# Patient Record
Sex: Female | Born: 1940 | Race: White | Hispanic: No | Smoking: Never smoker
Health system: Southern US, Community
[De-identification: ages and names within clinical notes are randomized; demographics above are authoritative.]

## PROBLEM LIST (undated history)

## (undated) DIAGNOSIS — I639 Cerebral infarction, unspecified: Secondary | ICD-10-CM

## (undated) DIAGNOSIS — E119 Type 2 diabetes mellitus without complications: Secondary | ICD-10-CM

## (undated) DIAGNOSIS — I10 Essential (primary) hypertension: Secondary | ICD-10-CM

## (undated) HISTORY — PX: ABDOMINAL HYSTERECTOMY: SHX81

---

## 2005-04-25 ENCOUNTER — Emergency Department: Payer: Self-pay | Admitting: Emergency Medicine

## 2005-06-22 ENCOUNTER — Emergency Department: Payer: Self-pay | Admitting: Unknown Physician Specialty

## 2006-01-28 ENCOUNTER — Emergency Department: Payer: Self-pay | Admitting: General Practice

## 2007-05-30 ENCOUNTER — Other Ambulatory Visit: Payer: Self-pay

## 2007-05-31 ENCOUNTER — Observation Stay: Payer: Self-pay | Admitting: Internal Medicine

## 2007-08-07 ENCOUNTER — Other Ambulatory Visit: Payer: Self-pay

## 2007-08-07 ENCOUNTER — Emergency Department: Payer: Self-pay | Admitting: Emergency Medicine

## 2008-11-23 ENCOUNTER — Emergency Department: Payer: Self-pay | Admitting: Emergency Medicine

## 2009-09-05 ENCOUNTER — Emergency Department: Payer: Self-pay | Admitting: Emergency Medicine

## 2009-10-21 ENCOUNTER — Emergency Department: Payer: Self-pay | Admitting: Emergency Medicine

## 2010-01-19 ENCOUNTER — Emergency Department: Payer: Self-pay | Admitting: Unknown Physician Specialty

## 2010-09-09 ENCOUNTER — Emergency Department: Payer: Self-pay | Admitting: *Deleted

## 2010-11-03 ENCOUNTER — Emergency Department: Payer: Self-pay | Admitting: Emergency Medicine

## 2010-11-09 ENCOUNTER — Emergency Department: Payer: Self-pay | Admitting: Emergency Medicine

## 2011-03-17 ENCOUNTER — Emergency Department: Payer: Self-pay | Admitting: Emergency Medicine

## 2011-03-17 LAB — COMPREHENSIVE METABOLIC PANEL
Albumin: 3.6 g/dL (ref 3.4–5.0)
Alkaline Phosphatase: 54 U/L (ref 50–136)
Anion Gap: 9 (ref 7–16)
BUN: 22 mg/dL — ABNORMAL HIGH (ref 7–18)
Bilirubin,Total: 0.4 mg/dL (ref 0.2–1.0)
Calcium, Total: 8.9 mg/dL (ref 8.5–10.1)
Creatinine: 1.06 mg/dL (ref 0.60–1.30)
EGFR (African American): >60
EGFR (Non-African Amer.): 55 — ABNORMAL LOW
Glucose: 234 mg/dL — ABNORMAL HIGH (ref 65–99)
Osmolality: 290 (ref 275–301)
Potassium: 4 mmol/L (ref 3.5–5.1)
SGOT(AST): 13 U/L — ABNORMAL LOW (ref 15–37)
Sodium: 140 mmol/L (ref 136–145)
Total Protein: 7.3 g/dL (ref 6.4–8.2)

## 2011-03-17 LAB — CBC
HGB: 11.9 g/dL — ABNORMAL LOW (ref 12.0–16.0)
MCH: 27.2 pg (ref 26.0–34.0)
Platelet: 203 10*3/uL (ref 150–440)
RBC: 4.36 10*6/uL (ref 3.80–5.20)
RDW: 15.3 % — ABNORMAL HIGH (ref 11.5–14.5)
WBC: 5.9 10*3/uL (ref 3.6–11.0)

## 2011-03-17 LAB — CK TOTAL AND CKMB (NOT AT ARMC)
CK, Total: 19 U/L — ABNORMAL LOW (ref 21–215)
CK-MB: <0.5 ng/mL — ABNORMAL LOW (ref 0.5–3.6)

## 2011-03-17 LAB — TROPONIN I: Troponin-I: <0.02 ng/mL

## 2011-10-20 ENCOUNTER — Emergency Department: Payer: Self-pay | Admitting: Emergency Medicine

## 2011-10-20 LAB — CBC WITH DIFFERENTIAL/PLATELET
Basophil %: 1.3 %
Eosinophil #: 0.1 10*3/uL (ref 0.0–0.7)
Eosinophil %: 2.2 %
HGB: 13.2 g/dL (ref 12.0–16.0)
Lymphocyte %: 32.8 %
MCH: 28.2 pg (ref 26.0–34.0)
Monocyte #: 0.3 x10 3/mm (ref 0.2–0.9)
Neutrophil #: 3.9 10*3/uL (ref 1.4–6.5)
Neutrophil %: 59.1 %
Platelet: 190 10*3/uL (ref 150–440)
RBC: 4.68 10*6/uL (ref 3.80–5.20)
WBC: 6.7 10*3/uL (ref 3.6–11.0)

## 2011-10-20 LAB — BASIC METABOLIC PANEL
Anion Gap: 11 (ref 7–16)
Calcium, Total: 9.3 mg/dL (ref 8.5–10.1)
Co2: 26 mmol/L (ref 21–32)
EGFR (African American): 53 — ABNORMAL LOW
Glucose: 373 mg/dL — ABNORMAL HIGH (ref 65–99)
Osmolality: 295 (ref 275–301)

## 2011-10-21 LAB — URINALYSIS, COMPLETE
Ketone: NEGATIVE
Nitrite: POSITIVE
Ph: 5 (ref 4.5–8.0)
Protein: 30
Specific Gravity: 1.014 (ref 1.003–1.030)
WBC UR: 2 /HPF (ref 0–5)

## 2011-12-10 ENCOUNTER — Observation Stay: Payer: Self-pay | Admitting: Internal Medicine

## 2011-12-10 LAB — CK TOTAL AND CKMB (NOT AT ARMC)
CK, Total: 17 U/L — ABNORMAL LOW (ref 21–215)
CK-MB: <0.5 ng/mL — ABNORMAL LOW (ref 0.5–3.6)

## 2011-12-10 LAB — BASIC METABOLIC PANEL
Anion Gap: 9 (ref 7–16)
Calcium, Total: 9.2 mg/dL (ref 8.5–10.1)
Chloride: 105 mmol/L (ref 98–107)
Co2: 25 mmol/L (ref 21–32)
Creatinine: 1.08 mg/dL (ref 0.60–1.30)
EGFR (African American): >60
EGFR (Non-African Amer.): 52 — ABNORMAL LOW
Glucose: 152 mg/dL — ABNORMAL HIGH (ref 65–99)

## 2011-12-10 LAB — CBC WITH DIFFERENTIAL/PLATELET
Basophil %: 1.2 %
Basophil: 1 %
Comment - H1-Com2: NORMAL
Eosinophil %: 7.4 %
Eosinophil: 8 %
HCT: 35.7 % (ref 35.0–47.0)
HGB: 12.5 g/dL (ref 12.0–16.0)
Lymphocyte #: 2.1 10*3/uL (ref 1.0–3.6)
MCH: 28.3 pg (ref 26.0–34.0)
MCHC: 35 g/dL (ref 32.0–36.0)
MCV: 81 fL (ref 80–100)
Monocyte #: 0.3 x10 3/mm (ref 0.2–0.9)
Monocyte %: 4.8 %
Monocytes: 7 %
Neutrophil #: 3.7 10*3/uL (ref 1.4–6.5)
Neutrophil %: 55.5 %
RBC: 4.42 10*6/uL (ref 3.80–5.20)
WBC: 6.7 10*3/uL (ref 3.6–11.0)

## 2011-12-12 LAB — BASIC METABOLIC PANEL
Anion Gap: 10 (ref 7–16)
BUN: 36 mg/dL — ABNORMAL HIGH (ref 7–18)
Calcium, Total: 8.3 mg/dL — ABNORMAL LOW (ref 8.5–10.1)
Chloride: 108 mmol/L — ABNORMAL HIGH (ref 98–107)
Co2: 21 mmol/L (ref 21–32)
Creatinine: 1.17 mg/dL (ref 0.60–1.30)
Glucose: 168 mg/dL — ABNORMAL HIGH (ref 65–99)

## 2013-07-27 ENCOUNTER — Emergency Department: Payer: Self-pay | Admitting: Emergency Medicine

## 2013-07-27 LAB — CBC
HCT: 37.2 % (ref 35.0–47.0)
HGB: 13 g/dL (ref 12.0–16.0)
MCH: 28.9 pg (ref 26.0–34.0)
MCHC: 34.8 g/dL (ref 32.0–36.0)
MCV: 83 fL (ref 80–100)
PLATELETS: 177 10*3/uL (ref 150–440)
RBC: 4.49 10*6/uL (ref 3.80–5.20)
RDW: 15.2 % — ABNORMAL HIGH (ref 11.5–14.5)
WBC: 6.6 10*3/uL (ref 3.6–11.0)

## 2013-07-27 LAB — BASIC METABOLIC PANEL
Anion Gap: 7 (ref 7–16)
BUN: 27 mg/dL — ABNORMAL HIGH (ref 7–18)
CALCIUM: 8.5 mg/dL (ref 8.5–10.1)
CO2: 24 mmol/L (ref 21–32)
Chloride: 106 mmol/L (ref 98–107)
Creatinine: 1.23 mg/dL (ref 0.60–1.30)
GFR CALC AF AMER: 51 — AB
GFR CALC NON AF AMER: 44 — AB
Glucose: 183 mg/dL — ABNORMAL HIGH (ref 65–99)
OSMOLALITY: 284 (ref 275–301)
Potassium: 4.2 mmol/L (ref 3.5–5.1)
Sodium: 137 mmol/L (ref 136–145)

## 2013-07-27 LAB — TROPONIN I

## 2013-08-01 ENCOUNTER — Emergency Department: Payer: Self-pay | Admitting: Emergency Medicine

## 2014-03-18 ENCOUNTER — Emergency Department: Payer: Self-pay | Admitting: Emergency Medicine

## 2014-03-19 ENCOUNTER — Emergency Department: Payer: Self-pay | Admitting: Emergency Medicine

## 2014-03-29 ENCOUNTER — Observation Stay: Payer: Self-pay | Admitting: Internal Medicine

## 2014-05-13 NOTE — H&P (Signed)
PATIENT NAME:  Alba CoryHODGES, Callia MR#:  [id number] DATE OF BIRTH:  December 22, 1940  DATE OF ADMISSION:  12/10/2011  PRIMARY CARE PHYSICIAN: [facility]    CHIEF COMPLAINT: Headache.   HISTORY OF PRESENT ILLNESS: 74 year old female with history of cerebrovascular accident, hypertension as well as diabetes mellitus presents with headache of one weeks' duration. Patient notes that headache was gradual in onset and was waxing and waning in nature. At maximal intensity was about 5 to 6 dull ache that was in the frontal region. She notes that intermittently 200 mg ibuprofen would help but not consistently. Due to chronicity of the headache she presented to the Emergency Department. In the Emergency Department she was noted to be significantly hypertensive with systolic blood pressures on presentation being 176/101, however, this progressed to systolics 224/121. She received IV Vasotec with decrease of her blood pressure to 208/80 so we are being called to admit patient for accelerated hypertension.   Patient denies chest pain, dizziness, lightheadedness, tinnitus, vision changes, dyspnea, lower extremity edema, palpitations.   PAST MEDICAL HISTORY:  1. Hypertension.  2. History of cerebrovascular accident with mild right-sided weakness.  3. Diabetes mellitus.  4. History of hypothyroidism, patient stopped Synthroid on her own accord.  5. History of hyperlipidemia, patient stopped her statin on her own accord.   MEDICATIONS:  1. Enalapril 20 mg twice a day.  2. Hydrochlorothiazide 25 mg daily.  3. Metformin 1000 mg twice a day.  4. Metoprolol 100 mg twice a day.  5. Review of records in 2009 notes that patient was on Synthroid and Plavix at that times as well as Lipitor. Patient tells me that she discontinued these medications on her own because she felt that she was taking too many medications.   PAST SURGICAL HISTORY: Bilateral cataract surgery.   ALLERGIES: No  known drug allergies.   FAMILY HISTORY: Notable for cancer of unknown type, diabetes, hypertension, coronary artery disease and CVA.   SOCIAL HISTORY: Patient lives with her daughter and grandchildren. She denies tobacco, alcohol or illicit drug use. She is ambulatory with a walker.   REVIEW OF SYSTEMS: CONSTITUTIONAL: Denies fever, fatigue, weight loss. EYES: Notes that at baseline she has some blurry vision. No changes to her vision. Denies eye pain. Has history of cataracts. ENT: Denies tinnitus, ear pain, difficulty swallowing. RESPIRATORY: Notes she had a very mild cough. Otherwise denies wheezing, shortness of breath or painful respirations. CARDIOVASCULAR: Denies chest pain, orthopnea, edema, or palpitations. GASTROINTESTINAL: Denies nausea, vomiting, melena, or rectal bleeding. Admits to chronic diarrhea due to metformin. GENITOURINARY: Denies dysuria, frequency. HEME: Admits to easy bruising. INTEGUMENT: Denies rash. MUSCULOSKELETAL: Denies myalgias or arthralgias or joint swelling. NEURO: Admits to headache as well as history of cerebrovascular accident. PSYCH: Denies anxiety or depression.   PHYSICAL EXAMINATION:  VITAL SIGNS: Blood pressure 200/77, during my exam dropped to 198/90, was as high as 224/121, respirations 20, heart rate 61, sating 98% on room air.   GENERAL: Elderly Caucasian female in no apparent distress.   PSYCH: She is awake, alert, oriented x3. History is obtained from the patient. Judgment is intact. She is in no distress.   EYES: Pupils equally round, reactive to light and accommodation. Funduscopic exam was attempted and no papilledema noted.   ENT: Normal external ears and nares. Oropharynx is clear without erythema.   NEUROLOGICAL: Cranial nerves II through XII are intact. There is equal strength bilateral upper and lower extremities. Sensation and motor function is  intact. There is mild dysdiadokinesia on the right, this is very mild.   RESPIRATORY: Clear to  auscultation bilaterally. No wheezes, rales, or rhonchi. There is normal effort.   CARDIOVASCULAR: S1, S2, regular rate and rhythm. No murmurs appreciated. Pulses are equal bilaterally. There is no pretibial edema.   ABDOMEN: Soft, nontender, nondistended. There is no hepatosplenomegaly. There is normal bowel sounds.   SKIN: Warm and dry. There is a mild ecchymotic lesion on the left forearm. Otherwise, no erythematous lesions.   LABORATORY, DIAGNOSTIC AND RADIOLOGICAL DATA: Point of care Accu-Chek 143. BMP shows glucose 152. BUN 36, creatinine 1.08, sodium 139, potassium 4, chloride 105, bicarbonate 25, anion gap 9, osmolality 289 with a calcium of 9.2. CK 17, CK-MB is less than 0.5. Troponin is less than 0.02. White count 6.7, hemoglobin 12.5, hematocrit 35.7, platelets 183 with an MCV of 81.   EKG shows normal sinus rhythm at 80 beats per minute with premature atrial complexes.   ASSESSMENT AND PLAN: 74 year old female presenting with headache found to have hypertensive urgency.  1. Accelerated hypertension. At this time will resume her home medications, increase dose of hydrochlorothiazide to 50 mg daily and add Norvasc 10 mg daily as well. Will control blood pressure with hydralazine p.r.n. for systolic blood pressures greater than 190. At this time goal blood pressure is basically systolics in between 170s and 190s for the first 24 hours and then aggressive control can then subsequently be pursued. Patient is clinically stable at this time.  2. Diabetes mellitus. Will continue her metformin.  3. History of cerebrovascular accident. Will start aspirin 325 mg daily. Patient is probably at risk without her statin however, she does not want to be on that at this time.   4. Hypothyroidism. Patient is not on any Synthroid. Will defer to primary care provider regarding further management.  5. Hyperlipidemia. Again will defer statin given patient's preferences. Risks explained. She notes that she has  been on them for quite some time.  6. Prophylaxis. Lovenox.  7. CODE STATUS: FULL CODE. Surrogate decision maker is her daughter, Michon Kaczmarek [facility].).   DISPOSITION: Patient is being admitted to observation status for management of accelerated hypertension.   TIME SPENT: 45 minutes.   ____________________________ Aurther Loft, DO aeo:cms D: 12/10/2011 04:23:17 ET T: 12/10/2011 09:08:38 ET JOB#: [id number]  cc: Aurther Loft, DO, <Dictator> [facility]  Bonnie Roig E Lamin Chandley DO ELECTRONICALLY SIGNED 12/21/2011 3:19

## 2014-05-13 NOTE — Discharge Summary (Signed)
PATIENT NAME:  Gina Kelly, Gina Kelly MR#:  [id number] DATE OF BIRTH:  01/01/1941  DATE OF ADMISSION:  12/10/2011 DATE OF DISCHARGE:  12/13/2011   DISCHARGE DIAGNOSES:  1. 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  2. History of cerebrovascular accident. 3. Diabetes mellitus.   4. Hyperlipidemia.   PRIMARY DOCTOR: [facility]    CONSULTATION: Physical therapy consult  HISTORY OF PRESENT ILLNESS:  1. The patient is a 74 year old female with a history of CVA, diabetes, and hypertension who came in with severe headache. The patient tried ibuprofen with no help. When she came in her blood pressure was 176/101 and also 224/121. The patient received IV Vasotec in the ER and admitted for accelerated hypertension and severe headache. CT of the head did not show any acute changes. Her medications have been adjusted. She is on HCTZ 25 mg daily which was increased to 50 mg daily. The patient's lisinopril is increased to 20 mg p.o. b.i.d. and Norvasc to 10 mg daily. The patient was monitored on telemetry. The patient's headache improved because blood pressure improved. CT of the head no changes. Troponins have been negative. Her CBC has been fine with white count 6.7, hemoglobin 12.5, hematocrit 35.7, platelets 183. The patient's blood pressure nicely improved, however, the patient was on metoprolol 100 p.o. b.i.d. which we continue. The patient's heart rate dropped into 50's so metoprolol has been stopped since yesterday morning and this morning heart rate improved to 70's. The patient's metoprolol has been stopped since yesterday because of bradycardia with heart rate up to 50's only. The patient right now is monitored on tele and metoprolol is stopped since yesterday and heart rate improved to 73. She will not be on metoprolol when we discharge her. She will be going home with Norvasc, enalapril, HCTZ, and hydralazine has been added. The patient will be on hydralazine 25 mg  p.o. 4 times daily. The patient received one dose of hydralazine 25 mg one dose 10 minutes ago.  2. Diarrhea. The patient had diarrhea. According to the daughter, she has chronic diarrhea and metformin has been stopped previously for two weeks. At that time she did not have diarrhea. Whenever she takes metformin she does get diarrhea. We stopped the metformin when she had diarrhea two days ago and stool cultures were sent which were negative for Clostridium difficile. The patient did well with stopping the metformin. I told the daughter to talk to her primary doctor, Dr. Sherrie MustacheFisher, regarding starting other medications for diabetes instead of metformin because she is not tolerating the metformin with severe diarrhea.   DISPOSITION: The patient will be going home. She is going to stay with her daughters. She is going home today.   TIME SPENT ON DISCHARGE PREPARATION: More than 30 minutes.   ____________________________ Katha HammingSnehalatha Bergen Melle, MD sk:drc D: 12/13/2011 13:04:52 ET T: 12/13/2011 13:50:16 ET JOB#: [id number]  cc: Katha HammingSnehalatha Azrielle Springsteen, MD, <Dictator> [facility], Dr. Ulice BrilliantFisher  Nyxon Strupp MD ELECTRONICALLY SIGNED 12/20/2011 12:32

## 2014-05-25 NOTE — Discharge Summary (Signed)
PATIENT NAME:  Gina Kelly, Hagar MR#:  [id number] DATE OF BIRTH:  02/22/1940  DATE OF ADMISSION:  03/29/2014 DATE OF DISCHARGE:  03/31/2014  DISCHARGE DIAGNOSES: 1. Acute renal failure and dehydration.  2. Urinary tract infection.  3. Pancytopenia, needed to be rechecked as outpatient.  4. Hypertension.  5. Diabetes.   CONDITION ON DISCHARGE: Stable.   MEDICATIONS ON DISCHARGE:  1. Atorvastatin 20 mg once a day. 2. Hydralazine 25 mg 2 times a day.  3. Fluticasone nasal spray once a day.  4. Levothyroxine 75 mcg once a day.  5. Metoprolol 100 mg oral tablet once a day  6. Glipizide 5 mg oral tablet once a day.  7. Ciprofloxacin 500 mg every 12 hours for 2 days.  8. Plavix 75 mg once a day.  9. Ondansetron 4 mg oral tablet 3 times a day as needed for nausea and vomiting.  HOME HEALTH ON DISCHARGE: Yes.  HOME HEALTH SERVICES: Nurse.  Advised to check creatinine and CBC in one week for home health nurse and communicated with primary care physician doctor's office.  HISTORY OF PRESENTING ILLNESS: A 74 year old female who presented to the Emergency Room with complaints of generalized weakness and malaise. She was recently started on antibiotic for UTI. Initially had some back pain with UTI. She was started on ciprofloxacin. She did not feel like the antibiotic was working. She was seen again and started on Vicodin for the back pain and later on changed to Percocet which helped her back pain some but, over the course of taking antibiotic, she felt like back pain resolved. Decreased oral intake for almost a week now and intermittent nausea and generalized malaise and weakness and so came to Emergency Room. On arrival, she was noted to have acute on chronic kidney failure, and so hospitalist service was contacted for further management of the issue.  HOSPITAL COURSE AND STAY: 1. She was found to have a UTI a week ago and was taking ciprofloxacin. We also had renal culture from that collection, which  was growing Escherichia coli sensitive to ciprofloxacin, so we continued Cipro in hospital and gave a few more days on discharge. Gave some Zofran for symptomatic nausea relief. 2. Type 2 diabetes. We held her glipizide. She was on sliding scale insulin and diabetic diet and was stable.  3. Hypertension. Beta blocker and hydralazine. We held hydrochlorothiazide and ACE inhibitor because of renal failure.  4. Hypothyroidism. Continued home medication dose of Synthroid.  5. Pancytopenia. Likely it was the effect of infection. She was advised to follow with the primary care physician in 1 week to check her total.   Plan discussed with her daughter who was present in the hospital also.   IMPORTANT LABORATORY RESULTS IN THE HOSPITAL: WBC count 3.5, hemoglobin 11.3,  MCV 82. Glucose 165, BUN 42, creatinine 2.16, sodium 132, potassium 4.7, chloride 99, CO2 of 23.   Chest x-ray PA and lateral showed no active cardiopulmonary disease.   CT scan of the head without contrast: No acute intracranial pathology.  Ultrasound of the abdomen showed cholelithiasis. no evidence of obstruction or cholecystitis. Creatinine was 2.15 on March WBC count 2.9, hemoglobin 9.8, and her creatinine improved to 2.06. CD4 was tested which was negative. Ultrasound of bilateral kidneys were done, which showed possible non-obstructing 3-mm left renal calculus. No evidence of hydronephrosis. On March 7, creatinine was 1.73 and WBC count 2.8.  TOTAL TIME SPENT ON THIS DISCHARGE: 40 minutes.   ____________________________ Hope PigeonVaibhavkumar G. Elisabeth PigeonVachhani, MD vgv:jh D:  04/03/2014 21:08:50 ET T: 04/04/2014 04:43:25 ET JOB#: [id number]  cc: Hope Pigeon. Elisabeth Pigeon, MD, <Dictator> Altamese Dilling MD ELECTRONICALLY SIGNED 04/10/2014 11:47

## 2014-05-25 NOTE — H&P (Signed)
PATIENT NAME:  Gina Kelly, Gina Kelly MR#:  [id number] DATE OF BIRTH:  1941/01/01  DATE OF ADMISSION:  03/29/2014  CHIEF COMPLAINT: Weakness.   HISTORY OF PRESENT ILLNESS: This is a 74 year old female who presents to the ED for a complaint of generalized weakness and malaise. The patient recently was put on antibiotics for a  UTI. Initially, she had back pain with this UTI and was put on ciprofloxacin. She states that she did not feel like the antibiotics were working. She continued to have some back pain. The patient states that she never had dysuria with this UTI, just left flank and back pain. She was seen again and was put on Vicodin for her back pain and then changed to Percocet and felt that this helped her back pain some. Over the course of taking the antibiotics, she felt like her back pain then resolved or was resolving, but she just had decreased p.o. intake for the last week and a half with some intermittent nausea and generalized malaise and generalized weakness. She came to the ED tonight and was found on labs to have acute on chronic kidney failure, and at this time the hospitalists were called for admission for the kidney failure.   PRIMARY CARE PHYSICIAN: Zannie Cove, FNP  PAST MEDICAL HISTORY: Type 2 diabetes mellitus, hypertension, stroke, hypothyroidism, chronic back pain   CURRENT MEDICATIONS: Toprol XL 100 mg daily, metformin 500 mg daily, Synthroid 75 mcg daily, HCTZ/lisinopril combo 25 mg/20 mg daily, hydralazine 25 mg b.i.d., glipizide 5 mg daily, Lipitor 20 mg daily, Plavix 75 mg daily.   PAST SURGICAL HISTORY: Hysterectomy.   ALLERGIES: No known drug allergies.   FAMILY HISTORY: Hypertension, coronary artery disease, diabetes mellitus, cancer.   SOCIAL HISTORY: Nonsmoker, The patient denies alcohol use. Denies other illicit drug use.   REVIEW OF SYSTEMS:  CONSTITUTIONAL: The patient denies fever. Endorses fatigue and weakness.  EYES: The patient denies blurred or double  vision, pain or redness.  ENT: The patient denies ear pain, hearing loss or difficulty swallowing.  RESPIRATORY: The patient endorses a mild cough. Denies wheeze or dyspnea.  CARDIOVASCULAR: The patient denies chest pain, edema or palpitations.  GASTROINTESTINAL: The patient endorses some intermittent episodes of nausea and vomiting without diarrhea. Denies abdominal pain or constipation.  GENITOURINARY: The patient denies dysuria, hematuria or frequency.  ENDOCRINE: The patient denies nocturia or heat or cold intolerance. Endorses chronic thyroid problems due to her hypothyroidism.  HEMATOLOGIC/LYMPHATIC: The patient denies easy bruising or bleeding or swollen glands.  INTEGUMENTARY: The patient denies acne, rash or lesions.  MUSCULOSKELETAL: The patient denies arthritis, swelling or gout.  NEUROLOGIC: The patient denies numbness, weakness or headache.  PSYCHIATRIC: The patient denies anxiety, insomnia or depression.   PHYSICAL EXAMINATION:  VITAL SIGNS: Blood pressure 122/75, pulse 50, temperature 97.8, respirations 20, O2 saturation 96% on room air.  GENERAL: The patient is well nourished, supine in bed, in no apparent distress.  HEENT: Pupils equal, round and reactive to light and accommodation. Extraocular movements intact. No scleral icterus. Mucosal membranes are dry.  NECK: Thyroid is normal without any enlargement. Her neck is supple with no masses and is nontender. There is no cervical adenopathy. No JVD noted.  RESPIRATORY: Clear to auscultation bilaterally with no rales, rhonchi or wheezes. The patient is not in respiratory distress.  CARDIOVASCULAR: Regular rate and rhythm. No murmur, rubs or gallops auscultated. Good pedal pulses without lower extremity edema.  ABDOMEN: Soft, nontender, nondistended with good bowel sounds and no hepatosplenomegaly.  MUSCULOSKELETAL:  The patient has full spontaneous range of motion throughout all extremities with 5/5 muscular strength in all  extremities and no cyanosis or clubbing.  SKIN: No rash or lesions noted. Skin is warm, dry and intact.  LYMPHATIC: There is no adenopathy.  NEUROLOGIC: Cranial nerves intact. Sensation intact throughout. No dysarthria or aphasia.  PSYCHIATRIC: The patient is alert and oriented x3 and cooperative.   LABORATORY DATA: White blood count 3.5, hemoglobin 11.3, hematocrit 33.8, platelets 175,000. Sodium 136, potassium 4.1, chloride 102, bicarbonate 22, BUN 42, creatinine 2.15, glucose 191. Total protein 6.9, albumin 3.1, total bilirubin 0.4, alkaline phosphatase 57, AST 77, ALT 80. Urinalysis was negative.   RADIOLOGY: Chest x-ray showed no active cardiopulmonary disease.   CT of the head without contrast showed no acute intracranial pathology, mild to moderate cortical volume loss, chronic encephalomalacia in the right frontoparietal region from her old stroke.   Abdominal ultrasound shows fatty infiltration of the liver, cholelithiasis without evidence for obstruction or cholecystitis and a likely a 6-mm polyp within the gallbladder.   ASSESSMENT AND PLAN:  1.  Acute on chronic renal failure. We will hold the metformin given her acute rise in creatinine above the recommended level to take metformin. We will also hold her lisinopril/HCTZ combo blood pressure medication as these can also increase her renal injury. We will hydrate her with fluids and keep her here in observation at least overnight. Recheck her labs in the morning and see if her kidney failure has not improved.  2.  Urinary tract infection. The patient has been treated for a UTI and has not quite finished her course of ciprofloxacin for this. She has about 2 days left. We will finish his antibiotic here.  3.  Type 2 diabetes mellitus. We will hold her glipizide inpatient as well, and we will put her on sliding scale insulin and a diabetic diet.  4.  Hypertension. We will continue some of her home antihypertensives such as her beta blocker  and her hydralazine, and we will monitor her blood pressure closely. If her blood pressure tends to drift up, we can add IV p.r.n. medications to keep this controlled.  5.  Hypothyroidism. We will continue her home dose of Synthroid here.  6.  Deep vein thrombosis prophylaxis with subcutaneous heparin.   CODE STATUS: This patient is full code.   TIME SPENT ON THIS ADMISSION: 45 minutes.    ____________________________ Candace Cruiseavid F. Anne HahnWillis, MD dfw:JT D: 03/30/2014 00:41:58 ET T: 03/30/2014 07:14:59 ET JOB#: [id number]  cc: Candace Cruiseavid F. Anne HahnWillis, MD, <Dictator> Tanja Gift Scotty CourtF Tramain Gershman MD ELECTRONICALLY SIGNED 03/30/2014 20:30

## 2014-07-28 ENCOUNTER — Encounter: Payer: Self-pay | Admitting: Emergency Medicine

## 2014-07-28 ENCOUNTER — Emergency Department
Admission: EM | Admit: 2014-07-28 | Discharge: 2014-07-28 | Disposition: A | Discharge status: Home / Self Care | Payer: Medicare Other | Attending: Emergency Medicine | Admitting: Emergency Medicine

## 2014-07-28 ENCOUNTER — Emergency Department: Payer: Medicare Other

## 2014-07-28 DIAGNOSIS — R05 Cough: Secondary | ICD-10-CM | POA: Diagnosis present

## 2014-07-28 DIAGNOSIS — Z7902 Long term (current) use of antithrombotics/antiplatelets: Secondary | ICD-10-CM | POA: Diagnosis not present

## 2014-07-28 DIAGNOSIS — Z79899 Other long term (current) drug therapy: Secondary | ICD-10-CM | POA: Diagnosis not present

## 2014-07-28 DIAGNOSIS — E119 Type 2 diabetes mellitus without complications: Secondary | ICD-10-CM | POA: Insufficient documentation

## 2014-07-28 DIAGNOSIS — I10 Essential (primary) hypertension: Secondary | ICD-10-CM | POA: Diagnosis not present

## 2014-07-28 DIAGNOSIS — J9811 Atelectasis: Secondary | ICD-10-CM

## 2014-07-28 HISTORY — DX: Type 2 diabetes mellitus without complications: E11.9

## 2014-07-28 HISTORY — DX: Cerebral infarction, unspecified: I63.9

## 2014-07-28 HISTORY — DX: Essential (primary) hypertension: I10

## 2014-07-28 MED ORDER — IPRATROPIUM-ALBUTEROL 0.5-2.5 (3) MG/3ML IN SOLN
RESPIRATORY_TRACT | Status: AC
  Filled 2014-07-28: qty 3

## 2014-07-28 MED ORDER — IPRATROPIUM-ALBUTEROL 0.5-2.5 (3) MG/3ML IN SOLN
3.0000 mL | Freq: Once | RESPIRATORY_TRACT | Status: AC
  Administered 2014-07-28: 3 mL via RESPIRATORY_TRACT

## 2014-07-28 MED ORDER — BENZONATATE 100 MG PO CAPS: 100.0000 mg | ORAL_CAPSULE | Freq: Three times a day (TID) | ORAL | Status: DC | PRN

## 2014-07-28 MED ORDER — PENICILLIN V POTASSIUM 500 MG PO TABS: 500.0000 mg | ORAL_TABLET | Freq: Four times a day (QID) | ORAL | Status: DC

## 2014-07-28 MED ORDER — ALBUTEROL SULFATE HFA 108 (90 BASE) MCG/ACT IN AERS: 2.0000 | INHALATION_SPRAY | Freq: Four times a day (QID) | RESPIRATORY_TRACT | Status: DC | PRN

## 2014-07-28 MED ORDER — DICLOFENAC SODIUM 75 MG PO TBEC: 75.0000 mg | DELAYED_RELEASE_TABLET | Freq: Two times a day (BID) | ORAL | Status: DC

## 2014-07-28 NOTE — Discharge Instructions (Signed)
Atelectasis Atelectasis is a collapse of the small air sacs in the lungs (alveoli). When this occurs, all or part of a lung collapses and becomes airless. It can be caused by various things and is a common problem after surgery. The severity of atelectasis will vary depending on the size of the area involved and the underlying cause of the condition. CAUSES  There are multiple causes for atelectasis:   Shallow breathing, particularly if there is an injury to your chest wall or abdomen that makes it painful to take a deep breath. This commonly occurs after surgery.  Obstruction of your airways (bronchi or bronchioles). This may be caused by a buildup of mucus (mucus plug), tumors, blood clots (pulmonary embolus), or inhaled foreign bodies. Mucus plugs occur when the lungs do not expand enough to get rid of mucus.  Outside pressure on the lung. This may be caused by tumors, fluid (pleural effusion), or a leakage of air between the lung and rib cage (pneumothorax).   Infections such as pneumonia.  Scarring in lung tissue left over from previous infection or injury.  Some diseases such as cystic fibrosis. SIGNS AND SYMPTOMS  Often, atelectasis will have no symptoms. When symptoms occur, they include:  Shortness of breath.   Bluish color to your nails, lips, or mouth (cyanosis). DIAGNOSIS  Your health care provider may suspect atelectasis based on symptoms and physical findings. A chest X-ray may be done to confirm the diagnosis. More specialized X-ray exams are sometimes required.  TREATMENT  Treatment will depend on the cause of the atelectasis. Treatment may include:  Purposeful coughing to loosen mucus plugs in the lungs.  Chest physiotherapy. This consists of clapping or percussion on the chest over the lungs to further loosen mucus plugs.  Postural drainage techniques. This involves positioning your body so your head is lower than your chest. HOME CARE INSTRUCTIONS  Practice  relaxed deep breathing whenever you are sitting down. A good technique is to take a few relaxed deep breaths each time a commercial comes on if you are watching television.  If you were given a deep breathing device (such as an incentive spirometer) or a mucus clearance device, use this regularly as directed by your health care provider.  Try to cough several times a day as directed by your health care provider.  Perform any chest physiotherapy or postural drainage techniques as directed by your health care provider. If necessary, have someone (such as a family member) assist you with these techniques.  When lying down, lie on the unaffected side to encourage mucus drainage.  Stay physically active as much as possible. SEEK IMMEDIATE MEDICAL CARE IF:   You develop increasing problems with your breathing.   You develop severe chest pain.   You develop severe coughing, or you cough up blood.   You have a fever or persistent symptoms for more than 2-3 days.   You have a fever and your symptoms suddenly get worse.  MAKE SURE YOU:  Understand these instructions.  Will watch your condition.  Will get help right away if you are not doing well or get worse. Document Released: 01/10/2005 Document Revised: 01/15/2013 Document Reviewed: 07/18/2012 [facility] Patient Information 2015 [employer] [place] [place]. This information is not intended to replace advice given to you by your health care provider. Make sure you discuss any questions you have with your health care provider.   Take the prescription meds as directed. Take deep breaths and walk at home to exercise lungs. See your  provider for follow-up care.

## 2014-07-28 NOTE — ED Provider Notes (Signed)
[facility] Emergency Department Provider Note ____________________________________________  Time seen: 48  I have reviewed the triage vital signs and the nursing notes.  HISTORY  Chief Complaint  Cough  HPI Gina Kelly is a 74 y.o. female reports to the ED with complaints of cough for 1 week, which appeared mostly productive the last 2 days. She is here with her adult daughter who reports that the cough seems to be worse in the early a.m. She has been dosing Tussin without significant relief.  She denies fevers, chills, sweats, or wheeze.   Past Medical History  Diagnosis Date  . Diabetes mellitus without complication   . Hypertension   . Stroke     2000   There are no active problems to display for this patient.  Past Surgical History  Procedure Laterality Date  . Abdominal hysterectomy      Current Outpatient Rx  Name  Route  Sig  Dispense  Refill  . atorvastatin (LIPITOR) 20 MG tablet   Oral   Take 20 mg by mouth daily.         . clopidogrel (PLAVIX) 75 MG tablet   Oral   Take 75 mg by mouth daily.         Marland Kitchen glipiZIDE (GLUCOTROL) 5 MG tablet   Oral   Take by mouth daily before breakfast.         . hydrochlorothiazide (HYDRODIURIL) 25 MG tablet   Oral   Take 25 mg by mouth daily.         . metFORMIN (GLUCOPHAGE) 500 MG tablet   Oral   Take by mouth 2 (two) times daily with a meal.         . metoprolol succinate (TOPROL-XL) 25 MG 24 hr tablet   Oral   Take 25 mg by mouth daily.         Marland Kitchen albuterol (PROVENTIL HFA;VENTOLIN HFA) 108 (90 BASE) MCG/ACT inhaler   Inhalation   Inhale 2 puffs into the lungs every 6 (six) hours as needed for wheezing or shortness of breath.   1 Inhaler   0   . benzonatate (TESSALON PERLES) 100 MG capsule   Oral   Take 1 capsule (100 mg total) by mouth 3 (three) times daily as needed for cough (Take 1-2 per dose).   30 capsule   0    Allergies Review of patient's allergies indicates no  known allergies.  No family history on file.  Social History History  Substance Use Topics  . Smoking status: Never Smoker   . Smokeless tobacco: Not on file  . Alcohol Use: No   Review of Systems  Constitutional: Negative for fever. Eyes: Negative for visual changes. ENT: Negative for sore throat. Cardiovascular: Negative for chest pain. Respiratory: Negative for shortness of breath. Reports intermittently productive cough Gastrointestinal: Negative for abdominal pain, vomiting and diarrhea. Genitourinary: Negative for dysuria. Musculoskeletal: Negative for back pain. Skin: Negative for rash. Neurological: Negative for headaches, focal weakness or numbness. ____________________________________________  PHYSICAL EXAM:  VITAL SIGNS: ED Triage Vitals  Enc Vitals Group     BP 07/28/14 1552 182/66 mmHg     Pulse Rate 07/28/14 1552 62     Resp 07/28/14 1552 18     Temp 07/28/14 1552 98.8 F (37.1 C)     Temp Source 07/28/14 1552 Oral     SpO2 07/28/14 1552 98 %     Weight 07/28/14 1552 139 lb (63.05 kg)     Height 07/28/14  1552 5\' 2"  (1.575 m)     Head Cir --      Peak Flow --      Pain Score 07/28/14 1552 0     Pain Loc --      Pain Edu? --      Excl. in GC? --    Constitutional: Alert and oriented. Well appearing and in no distress. Eyes: Conjunctivae are normal. PERRL. Normal extraocular movements. ENT   Head: Normocephalic and atraumatic.   Nose: No congestion/rhinnorhea.   Mouth/Throat: Mucous membranes are moist.   Neck: Supple. No thyromegaly. Hematological/Lymphatic/Immunilogical: No cervical lymphadenopathy. Cardiovascular: Normal rate, regular rhythm.  Respiratory: Normal respiratory effort. No wheezes/rales/rhonchi. Gastrointestinal: Soft and nontender. No distention. Musculoskeletal: Nontender with normal range of motion in all extremities.  Neurologic:  Normal gait without ataxia. Normal speech and language. No gross focal neurologic  deficits are appreciated. Skin:  Skin is warm, dry and intact. No rash noted. Psychiatric: Mood and affect are normal. Patient exhibits appropriate insight and judgment. ____________________________________________   RADIOLOGY  CXR IMPRESSION: Minimal bibasilar atelectasis or scarring noted; lungs otherwise Clear.  I, Menshew, Charlesetta IvoryJenise V Bacon, personally viewed and evaluated these images as part of my medical decision making.  ____________________________________________  PROCEDURES  Duoneb x 1 - patient reports improvement ____________________________________________  INITIAL IMPRESSION / ASSESSMENT AND PLAN / ED COURSE  Radiology results to patient and family.  Treatment of atelectasis with increased activity at home. Family suggests she get out of her chair more.  Prescriptions for Tessalon Perles and albuterol MDI given.  Patient encouraged to exercise lungs by doing deep breathing exercises.  Follow-up with primary provider as needed.  ____________________________________________  FINAL CLINICAL IMPRESSION(S) / ED DIAGNOSES  Final diagnoses:  Atelectasis of both lungs  Cough   Lissa HoardJenise V Bacon Menshew, PA-C 07/29/14 0051  I have reviewed the mid-level's documentation and was available to the mid-level if needed during evaluation of this patient.    Darien Ramusavid W Danesha Kirchoff, MD 07/31/14 [phone]

## 2014-07-28 NOTE — ED Notes (Signed)
MD at bedside. 

## 2014-07-28 NOTE — ED Notes (Signed)
Cough for a week now, denies chills/fever.

## 2015-03-08 ENCOUNTER — Emergency Department
Admission: EM | Admit: 2015-03-08 | Discharge: 2015-03-08 | Disposition: A | Discharge status: Home / Self Care | Payer: Medicare Other | Attending: Emergency Medicine | Admitting: Emergency Medicine

## 2015-03-08 ENCOUNTER — Emergency Department: Payer: Medicare Other

## 2015-03-08 DIAGNOSIS — R05 Cough: Secondary | ICD-10-CM | POA: Diagnosis present

## 2015-03-08 DIAGNOSIS — Z79899 Other long term (current) drug therapy: Secondary | ICD-10-CM | POA: Diagnosis not present

## 2015-03-08 DIAGNOSIS — E119 Type 2 diabetes mellitus without complications: Secondary | ICD-10-CM | POA: Diagnosis not present

## 2015-03-08 DIAGNOSIS — I10 Essential (primary) hypertension: Secondary | ICD-10-CM | POA: Insufficient documentation

## 2015-03-08 DIAGNOSIS — J069 Acute upper respiratory infection, unspecified: Secondary | ICD-10-CM | POA: Insufficient documentation

## 2015-03-08 DIAGNOSIS — Z7902 Long term (current) use of antithrombotics/antiplatelets: Secondary | ICD-10-CM | POA: Insufficient documentation

## 2015-03-08 DIAGNOSIS — Z7984 Long term (current) use of oral hypoglycemic drugs: Secondary | ICD-10-CM | POA: Insufficient documentation

## 2015-03-08 LAB — URINALYSIS COMPLETE WITH MICROSCOPIC (ARMC ONLY)
BACTERIA UA: NONE SEEN
BILIRUBIN URINE: NEGATIVE
Glucose, UA: >500 mg/dL — AB
Hgb urine dipstick: NEGATIVE
Ketones, ur: NEGATIVE mg/dL
Nitrite: NEGATIVE
PH: 5 (ref 5.0–8.0)
Protein, ur: 30 mg/dL — AB
Specific Gravity, Urine: 1.014 (ref 1.005–1.030)

## 2015-03-08 LAB — CBC
HEMATOCRIT: 35.9 % (ref 35.0–47.0)
HEMOGLOBIN: 12.4 g/dL (ref 12.0–16.0)
MCH: 28 pg (ref 26.0–34.0)
MCHC: 34.6 g/dL (ref 32.0–36.0)
MCV: 81 fL (ref 80.0–100.0)
Platelets: 170 10*3/uL (ref 150–440)
RBC: 4.44 MIL/uL (ref 3.80–5.20)
RDW: 14.5 % (ref 11.5–14.5)
WBC: 8.8 10*3/uL (ref 3.6–11.0)

## 2015-03-08 LAB — GLUCOSE, CAPILLARY: Glucose-Capillary: 405 mg/dL — ABNORMAL HIGH (ref 65–99)

## 2015-03-08 LAB — COMPREHENSIVE METABOLIC PANEL
ALBUMIN: 3.7 g/dL (ref 3.5–5.0)
ALT: 25 U/L (ref 14–54)
AST: 18 U/L (ref 15–41)
Alkaline Phosphatase: 72 U/L (ref 38–126)
Anion gap: 10 (ref 5–15)
BUN: 39 mg/dL — AB (ref 6–20)
CHLORIDE: 99 mmol/L — AB (ref 101–111)
CO2: 23 mmol/L (ref 22–32)
Calcium: 8.8 mg/dL — ABNORMAL LOW (ref 8.9–10.3)
Creatinine, Ser: 1.43 mg/dL — ABNORMAL HIGH (ref 0.44–1.00)
GFR calc Af Amer: 41 mL/min — ABNORMAL LOW (ref >60)
GFR calc non Af Amer: 35 mL/min — ABNORMAL LOW (ref >60)
Glucose, Bld: 448 mg/dL — ABNORMAL HIGH (ref 65–99)
POTASSIUM: 4.5 mmol/L (ref 3.5–5.1)
Sodium: 132 mmol/L — ABNORMAL LOW (ref 135–145)
Total Bilirubin: 0.9 mg/dL (ref 0.3–1.2)
Total Protein: 7.4 g/dL (ref 6.5–8.1)

## 2015-03-08 MED ORDER — OSELTAMIVIR PHOSPHATE 75 MG PO CAPS: 75.0000 mg | ORAL_CAPSULE | Freq: Two times a day (BID) | ORAL | Status: DC

## 2015-03-08 NOTE — Discharge Instructions (Signed)
Please take your medication as prescribed. Please drink plenty of water, recheck your blood glucose tonight or tomorrow. Please follow-up with your primary care doctor in 1-2 days for recheck/reevaluation. Return to the emergency department for any worsening symptoms, or any other personally concerning symptoms.   Upper Respiratory Infection, Adult Most upper respiratory infections (URIs) are a viral infection of the air passages leading to the lungs. A URI affects the nose, throat, and upper air passages. The most common type of URI is nasopharyngitis and is typically referred to as "the common cold." URIs run their course and usually go away on their own. Most of the time, a URI does not require medical attention, but sometimes a bacterial infection in the upper airways can follow a viral infection. This is called a secondary infection. Sinus and middle ear infections are common types of secondary upper respiratory infections. Bacterial pneumonia can also complicate a URI. A URI can worsen asthma and chronic obstructive pulmonary disease (COPD). Sometimes, these complications can require emergency medical care and may be life threatening.  CAUSES Almost all URIs are caused by viruses. A virus is a type of germ and can spread from one person to another.  RISKS FACTORS You may be at risk for a URI if:   You smoke.   You have chronic heart or lung disease.  You have a weakened defense (immune) system.   You are very young or very old.   You have nasal allergies or asthma.  You work in crowded or poorly ventilated areas.  You work in health care facilities or schools. SIGNS AND SYMPTOMS  Symptoms typically develop 2-3 days after you come in contact with a cold virus. Most viral URIs last 7-10 days. However, viral URIs from the influenza virus (flu virus) can last 14-18 days and are typically more severe. Symptoms may include:   Runny or stuffy (congested) nose.   Sneezing.   Cough.    Sore throat.   Headache.   Fatigue.   Fever.   Loss of appetite.   Pain in your forehead, behind your eyes, and over your cheekbones (sinus pain).  Muscle aches.  DIAGNOSIS  Your health care provider may diagnose a URI by:  Physical exam.  Tests to check that your symptoms are not due to another condition such as:  Strep throat.  Sinusitis.  Pneumonia.  Asthma. TREATMENT  A URI goes away on its own with time. It cannot be cured with medicines, but medicines may be prescribed or recommended to relieve symptoms. Medicines may help:  Reduce your fever.  Reduce your cough.  Relieve nasal congestion. HOME CARE INSTRUCTIONS   Take medicines only as directed by your health care provider.   Gargle warm saltwater or take cough drops to comfort your throat as directed by your health care provider.  Use a warm mist humidifier or inhale steam from a shower to increase air moisture. This may make it easier to breathe.  Drink enough fluid to keep your urine clear or pale yellow.   Eat soups and other clear broths and maintain good nutrition.   Rest as needed.   Return to work when your temperature has returned to normal or as your health care provider advises. You may need to stay home longer to avoid infecting others. You can also use a face mask and careful hand washing to prevent spread of the virus.  Increase the usage of your inhaler if you have asthma.   Do not use any  tobacco products, including cigarettes, chewing tobacco, or electronic cigarettes. If you need help quitting, ask your health care provider. PREVENTION  The best way to protect yourself from getting a cold is to practice good hygiene.   Avoid oral or hand contact with people with cold symptoms.   Wash your hands often if contact occurs.  There is no clear evidence that vitamin C, vitamin E, echinacea, or exercise reduces the chance of developing a cold. However, it is always  recommended to get plenty of rest, exercise, and practice good nutrition.  SEEK MEDICAL CARE IF:   You are getting worse rather than better.   Your symptoms are not controlled by medicine.   You have chills.  You have worsening shortness of breath.  You have brown or red mucus.  You have yellow or brown nasal discharge.  You have pain in your face, especially when you bend forward.  You have a fever.  You have swollen neck glands.  You have pain while swallowing.  You have white areas in the back of your throat. SEEK IMMEDIATE MEDICAL CARE IF:   You have severe or persistent:  Headache.  Ear pain.  Sinus pain.  Chest pain.  You have chronic lung disease and any of the following:  Wheezing.  Prolonged cough.  Coughing up blood.  A change in your usual mucus.  You have a stiff neck.  You have changes in your:  Vision.  Hearing.  Thinking.  Mood. MAKE SURE YOU:   Understand these instructions.  Will watch your condition.  Will get help right away if you are not doing well or get worse.   This information is not intended to replace advice given to you by your health care provider. Make sure you discuss any questions you have with your health care provider.   Document Released: 07/06/2000 Document Revised: 05/27/2014 Document Reviewed: 04/17/2013 Elsevier Interactive Patient Education [employer]

## 2015-03-08 NOTE — ED Notes (Signed)
Pt c/o lower back pain, cough, congestion X "a few days"Pt alert and oriented X4, active, cooperative, pt in NAD. RR even and unlabored, color WNL.

## 2015-03-08 NOTE — ED Provider Notes (Signed)
[facility] Emergency Department Provider Note  Time seen: 3:25 PM  I have reviewed the triage vital signs and the nursing notes.   HISTORY  Chief Complaint URI    HPI Lineth Thielke is a 75 y.o. female with a past medical history of diabetes, hypertension, CVA, who presents the emergency department with cough and congestion for the past 3 days. According to the patient for the past 2-3 days she has been having a cough, with sore throat.Patient states occasional sputum production, denies fever, nausea, vomiting, abdominal pain. Denies dysuria. Describes the cough as moderate.     Past Medical History  Diagnosis Date  . Diabetes mellitus without complication   . Hypertension   . Stroke     2000    There are no active problems to display for this patient.   Past Surgical History  Procedure Laterality Date  . Abdominal hysterectomy      Current Outpatient Rx  Name  Route  Sig  Dispense  Refill  . albuterol (PROVENTIL HFA;VENTOLIN HFA) 108 (90 BASE) MCG/ACT inhaler   Inhalation   Inhale 2 puffs into the lungs every 6 (six) hours as needed for wheezing or shortness of breath.   1 Inhaler   0   . atorvastatin (LIPITOR) 20 MG tablet   Oral   Take 20 mg by mouth daily.         . benzonatate (TESSALON PERLES) 100 MG capsule   Oral   Take 1 capsule (100 mg total) by mouth 3 (three) times daily as needed for cough (Take 1-2 per dose).   30 capsule   0   . clopidogrel (PLAVIX) 75 MG tablet   Oral   Take 75 mg by mouth daily.         Marland Kitchen glipiZIDE (GLUCOTROL) 5 MG tablet   Oral   Take by mouth daily before breakfast.         . hydrochlorothiazide (HYDRODIURIL) 25 MG tablet   Oral   Take 25 mg by mouth daily.         . metFORMIN (GLUCOPHAGE) 500 MG tablet   Oral   Take by mouth 2 (two) times daily with a meal.         . metoprolol succinate (TOPROL-XL) 25 MG 24 hr tablet   Oral   Take 25 mg by mouth daily.            Allergies Review of patient's allergies indicates no known allergies.  No family history on file.  Social History Social History  Substance Use Topics  . Smoking status: Never Smoker   . Smokeless tobacco: Not on file  . Alcohol Use: No    Review of Systems Constitutional: Negative for fever. Cardiovascular: Negative for chest pain. Respiratory: Negative for shortness of breath. Positive for cough. Gastrointestinal: Negative for abdominal pain Genitourinary: Negative for dysuria. Skin: Negative for rash. Neurological: Mild headache. 10-point ROS otherwise negative.  ____________________________________________   PHYSICAL EXAM:  VITAL SIGNS: ED Triage Vitals  Enc Vitals Group     BP 03/08/15 1237 164/75 mmHg     Pulse Rate 03/08/15 1237 88     Resp 03/08/15 1237 18     Temp 03/08/15 1237 99.2 F (37.3 C)     Temp Source 03/08/15 1237 Oral     SpO2 03/08/15 1237 96 %     Weight 03/08/15 1237 139 lb (63.05 kg)     Height 03/08/15 1237  (1.575 m)  Head Cir --      Peak Flow --      Pain Score 03/08/15 1239 8     Pain Loc --      Pain Edu? --      Excl. in GC? --     Constitutional: Alert and oriented. Well appearing and in no distress. Eyes: Normal exam ENT   Head: Normocephalic and atraumatic.   Mouth/Throat: Mucous membranes are moist. Cardiovascular: Normal rate, regular rhythm. No murmur Respiratory: Normal respiratory effort without tachypnea nor retractions. Breath sounds are clear. Occasional cough during exam. Gastrointestinal: Soft and nontender. No distention.   Musculoskeletal: Nontender with normal range of motion in all extremities. Neurologic:  Normal speech and language. No gross focal neurologic deficits Skin:  Skin is warm, dry and intact.  Psychiatric: Mood and affect are normal. Speech and behavior are normal. ____________________________________________    EKG  EKG reviewed and interpreted by myself shows normal  sinus rhythm at 79 bpm, narrow QRS, left axis deviation, normal intervals, nonspecific ST changes. No ST elevations.  ____________________________________________    RADIOLOGY  Chest x-ray shows no acute disease.  INITIAL IMPRESSION / ASSESSMENT AND PLAN / ED COURSE  Pertinent labs & imaging results that were available during my care of the patient were reviewed by me and considered in my medical decision making (see chart for details).  Patient presents to the emergency department 3 days of cough, congestion, mild headache. Patient's grandson was diagnosed with influenza 3 days ago. Patient has been exposed to her grandson over the past week. Patient's labs show an elevated blood glucose. Patient is aware of this, she has a home health nurse who has been checking her blood glucose, it was 200s earlier today. I discussed with the patient and her elevated blood glucose obtaining IV to dose fluids and possibly insulin if her blood sugar does not respond, the patient is adamantly against an IV, states she will increase her water consumption at home. Patient denies any dysuria however she does have mildly cloudy urine, we will send the urine for urinalysis. Given the patient's recent influenza exposure, cough, congestion, headache with low-grade fever in the emergency department we will cover with Tamiflu for possible influenza. Patient's labs are otherwise quite reassuring with mild renal insufficiency, and a normal white blood cell count.  Urinalysis negative. We'll discharge patient with Tamiflu and PCP follow-up in 1-2 days. Patient agreeable. Agrees to hydrate orally at home with plenty of water, I discussed with the patient's family the need to recheck her blood glucose tonight or tomorrow. Patient and family are agreeable. The home health nurse will be out to see the patient tomorrow.  ____________________________________________   FINAL CLINICAL IMPRESSION(S) / ED DIAGNOSES  Viral upper  respiratory infection   Minna Antis, MD 03/08/15 1624

## 2015-03-08 NOTE — ED Notes (Addendum)
Pt reports cold symptoms for last 2-3 days. Pt reports increased coughing at night. Denies chest pain or fever reports headache. Pt has diabetes accu check in triage 405. Pt takes metformin.

## 2015-03-08 NOTE — ED Notes (Signed)
Pt daughter at to nurses station states that they are "just going to leave" due to waiting for discharge paperwork. Pt family and pt informed that pt is marked for discharge and that the papers will be up shortly. Pt daughter appears frustrated at the amount of time that they have been in ER and states that "the pharmacy will be closed and she will have to go home with no medication". \

## 2015-03-08 NOTE — ED Notes (Signed)
Pt alert and oriented X4, active, cooperative, pt in NAD. RR even and unlabored, color WNL.  Pt informed to return if any life threatening symptoms occur.   

## 2016-05-18 ENCOUNTER — Emergency Department
Admission: EM | Admit: 2016-05-18 | Discharge: 2016-05-19 | Disposition: A | Discharge status: Home / Self Care | Payer: Medicare Other | Attending: Emergency Medicine | Admitting: Emergency Medicine

## 2016-05-18 ENCOUNTER — Emergency Department: Payer: Medicare Other

## 2016-05-18 ENCOUNTER — Encounter: Payer: Self-pay | Admitting: Emergency Medicine

## 2016-05-18 DIAGNOSIS — I10 Essential (primary) hypertension: Secondary | ICD-10-CM | POA: Insufficient documentation

## 2016-05-18 DIAGNOSIS — E119 Type 2 diabetes mellitus without complications: Secondary | ICD-10-CM | POA: Insufficient documentation

## 2016-05-18 DIAGNOSIS — N3001 Acute cystitis with hematuria: Secondary | ICD-10-CM | POA: Diagnosis not present

## 2016-05-18 DIAGNOSIS — Z79899 Other long term (current) drug therapy: Secondary | ICD-10-CM | POA: Diagnosis not present

## 2016-05-18 DIAGNOSIS — R42 Dizziness and giddiness: Secondary | ICD-10-CM

## 2016-05-18 DIAGNOSIS — Z7984 Long term (current) use of oral hypoglycemic drugs: Secondary | ICD-10-CM | POA: Diagnosis not present

## 2016-05-18 LAB — BASIC METABOLIC PANEL
Anion gap: 9 (ref 5–15)
BUN: 45 mg/dL — ABNORMAL HIGH (ref 6–20)
CALCIUM: 9.1 mg/dL (ref 8.9–10.3)
CO2: 25 mmol/L (ref 22–32)
CREATININE: 1.32 mg/dL — AB (ref 0.44–1.00)
Chloride: 103 mmol/L (ref 101–111)
GFR calc Af Amer: 45 mL/min — ABNORMAL LOW (ref >60)
GFR, EST NON AFRICAN AMERICAN: 39 mL/min — AB (ref >60)
GLUCOSE: 221 mg/dL — AB (ref 65–99)
Potassium: 4.1 mmol/L (ref 3.5–5.1)
Sodium: 137 mmol/L (ref 135–145)

## 2016-05-18 LAB — CBC WITH DIFFERENTIAL/PLATELET
BASOS ABS: 0.1 10*3/uL (ref 0–0.1)
BASOS PCT: 1 %
EOS PCT: 4 %
Eosinophils Absolute: 0.3 10*3/uL (ref 0–0.7)
HEMATOCRIT: 37.8 % (ref 35.0–47.0)
Hemoglobin: 12.9 g/dL (ref 12.0–16.0)
LYMPHS PCT: 35 %
Lymphs Abs: 2.8 10*3/uL (ref 1.0–3.6)
MCH: 28.7 pg (ref 26.0–34.0)
MCHC: 34.2 g/dL (ref 32.0–36.0)
MCV: 83.8 fL (ref 80.0–100.0)
MONO ABS: 0.5 10*3/uL (ref 0.2–0.9)
MONOS PCT: 6 %
NEUTROS ABS: 4.3 10*3/uL (ref 1.4–6.5)
Neutrophils Relative %: 54 %
PLATELETS: 169 10*3/uL (ref 150–440)
RBC: 4.51 MIL/uL (ref 3.80–5.20)
RDW: 14.5 % (ref 11.5–14.5)
WBC: 8.1 10*3/uL (ref 3.6–11.0)

## 2016-05-18 LAB — URINALYSIS, COMPLETE (UACMP) WITH MICROSCOPIC
Bilirubin Urine: NEGATIVE
Glucose, UA: NEGATIVE mg/dL
Hgb urine dipstick: NEGATIVE
Ketones, ur: NEGATIVE mg/dL
Nitrite: NEGATIVE
PROTEIN: NEGATIVE mg/dL
SPECIFIC GRAVITY, URINE: 1.006 (ref 1.005–1.030)
pH: 5 (ref 5.0–8.0)

## 2016-05-18 LAB — TROPONIN I: Troponin I: <0.03 ng/mL (ref <0.03)

## 2016-05-18 NOTE — ED Notes (Signed)
Pt in CT at this time.

## 2016-05-18 NOTE — ED Notes (Signed)
MD Don Perking at bedside at this time.

## 2016-05-18 NOTE — ED Notes (Signed)
Gave shift change report to [employer].N.

## 2016-05-18 NOTE — ED Notes (Signed)
Pt. Daughter states that mother was dizzy today and had diarrhea last night. Pt states that she is a diabetic. Pt has also been having SOB.

## 2016-05-18 NOTE — ED Triage Notes (Signed)
Pt presents to ED with elevated blood pressure and dizziness since last night. Denies any other symptoms at this time.

## 2016-05-18 NOTE — ED Provider Notes (Signed)
[facility] Emergency Department Provider Note  ____________________________________________  Time seen: Approximately 11:23 PM  I have reviewed the triage vital signs and the nursing notes.   HISTORY  Chief Complaint Dizziness and Hypertension  Level 5 caveat:  Portions of the history and physical were unable to be obtained due to patient being poor historian   HPI Gina Kelly is a 76 y.o. female with history of diabetes, hypertension, CVA who presents for evaluation of dizziness. Patient reports on and off episodes of vertigo started yesterday evening. According to her daughter she has been most of the day in bed, has felt very weak. Yesterday evening she had 4 episodes of watery diarrhea however that has resolved today. No dysarthria, dysphasia, diplopia, facial droop, difficulty walking, unilateral weakness or numbness, difficulty finding words are slurred speech. No changes in gait and patient uses walker at home. She denies that she feels lightheaded like she is going to faint. Patient was seen by her primary care doctor 2 days ago for similar episode. Patient is unable to tell me if these episodes are brought on by movement and what makes them go away. Patient is a very poor historian. She denies prior history of vertigo. She is on Plavix. She is also complaining of a mild headache that has been present since this evening that is generalized and throbbing.  Past Medical History:  Diagnosis Date  . Diabetes mellitus without complication (HCC)   . Hypertension   . Stroke [facility])    2000    There are no active problems to display for this patient.   Past Surgical History:  Procedure Laterality Date  . ABDOMINAL HYSTERECTOMY      Prior to Admission medications   Medication Sig Start Date End Date Taking? Authorizing Provider  albuterol (PROVENTIL HFA;VENTOLIN HFA) 108 (90 BASE) MCG/ACT inhaler Inhale 2 puffs into the lungs every 6 (six) hours as  needed for wheezing or shortness of breath. 07/28/14   Jenise V Bacon Menshew, PA-C  atorvastatin (LIPITOR) 20 MG tablet Take 20 mg by mouth daily.    Historical Provider, MD  benzonatate (TESSALON PERLES) 100 MG capsule Take 1 capsule (100 mg total) by mouth 3 (three) times daily as needed for cough (Take 1-2 per dose). 07/28/14   Jenise V Bacon Menshew, PA-C  cephALEXin (KEFLEX) 500 MG capsule Take 1 capsule (500 mg total) by mouth 3 (three) times daily. 05/19/16 05/26/16  Nita Sickle, MD  clopidogrel (PLAVIX) 75 MG tablet Take 75 mg by mouth daily.    Historical Provider, MD  glipiZIDE (GLUCOTROL) 5 MG tablet Take by mouth daily before breakfast.    Historical Provider, MD  hydrochlorothiazide (HYDRODIURIL) 25 MG tablet Take 25 mg by mouth daily.    Historical Provider, MD  metFORMIN (GLUCOPHAGE) 500 MG tablet Take by mouth 2 (two) times daily with a meal.    Historical Provider, MD  metoprolol succinate (TOPROL-XL) 25 MG 24 hr tablet Take 25 mg by mouth daily.    Historical Provider, MD  oseltamivir (TAMIFLU) 75 MG capsule Take 1 capsule (75 mg total) by mouth 2 (two) times daily. 03/08/15   Minna Antis, MD    Allergies Patient has no known allergies.  No family history on file.  Social History Social History  Substance Use Topics  . Smoking status: Never Smoker  . Smokeless tobacco: Not on file  . Alcohol use No    Review of Systems  Constitutional: Negative for fever.  Eyes: Negative for visual  changes. ENT: Negative for sore throat. Neck: No neck pain  Cardiovascular: Negative for chest pain. Respiratory: Negative for shortness of breath. Gastrointestinal: Negative for abdominal pain, vomiting or diarrhea. Genitourinary: Negative for dysuria. Musculoskeletal: Negative for back pain. Skin: Negative for rash. Neurological: Negative for headaches, weakness or numbness. + vertigo Psych: No SI or HI  ____________________________________________   PHYSICAL  EXAM:  VITAL SIGNS: ED Triage Vitals  Enc Vitals Group     BP 05/18/16 2240 (!) 242/81     Pulse Rate 05/18/16 2240 (!) 55     Resp 05/18/16 2240 18     Temp 05/18/16 2240 98.4 F (36.9 C)     Temp Source 05/18/16 2240 Oral     SpO2 05/18/16 2240 100 %     Weight 05/18/16 2241 145 lb (65.8 kg)     Height 05/18/16 2241  (1.575 m)     Head Circumference --      Peak Flow --      Pain Score --      Pain Loc --      Pain Edu? --      Excl. in GC? --     Constitutional: Alert and oriented. Well appearing and in no apparent distress. HEENT:      Head: Normocephalic and atraumatic.         Eyes: Conjunctivae are normal. Sclera is non-icteric. EOMI. PERRL      Mouth/Throat: Mucous membranes are moist.       Neck: Supple with no signs of meningismus. Cardiovascular: Regular rate and rhythm. No murmurs, gallops, or rubs. 2+ symmetrical distal pulses are present in all extremities. No JVD. Respiratory: Normal respiratory effort. Lungs are clear to auscultation bilaterally. No wheezes, crackles, or rhonchi.  Gastrointestinal: Soft, non tender, and non distended with positive bowel sounds. No rebound or guarding. Musculoskeletal: Nontender with normal range of motion in all extremities. No edema, cyanosis, or erythema of extremities. Neurologic: Normal speech and language. A & O x3, PERRL, no nystagmus, CN II-XII intact, motor testing reveals good tone and bulk throughout. There is no evidence of pronator drift or dysmetria. Muscle strength is 5/5 throughout. Deep tendon reflexes are 2+ throughout with downgoing toes. Sensory examination is intact. Gait is wide and slow (baseline) Skin: Skin is warm, dry and intact. No rash noted. Psychiatric: Mood and affect are normal. Speech and behavior are normal.  ____________________________________________   LABS (all labs ordered are listed, but only abnormal results are displayed)  Labs Reviewed  BASIC METABOLIC PANEL - Abnormal; Notable  for the following:       Result Value   Glucose, Bld 221 (*)    BUN 45 (*)    Creatinine, Ser 1.32 (*)    GFR calc non Af Amer 39 (*)    GFR calc Af Amer 45 (*)    All other components within normal limits  URINALYSIS, COMPLETE (UACMP) WITH MICROSCOPIC - Abnormal; Notable for the following:    Color, Urine YELLOW (*)    APPearance HAZY (*)    Leukocytes, UA MODERATE (*)    Bacteria, UA MANY (*)    Squamous Epithelial / LPF 0-5 (*)    All other components within normal limits  URINE CULTURE  CBC WITH DIFFERENTIAL/PLATELET  TROPONIN I   ____________________________________________  EKG  ED ECG REPORT I, Nita Sickle, the attending physician, personally viewed and interpreted this ECG.  Sinus bradycardia, rate of 54, normal intervals, normal axis, low voltage QRS, T-wave inversion in  aVL, no ST elevations or depressions. EKG unchanged from prior from September 2013 ____________________________________________  RADIOLOGY  Head CT: No acute intracranial pathology seen on CT. 2. Mild cortical volume loss noted. 3. Small chronic infarct at the high right frontal lobe.  Brain MRI: No acute intracranial abnormality. 2. Old infarcts of the right superior frontal gyrus and left caudate head. ____________________________________________   PROCEDURES  Procedure(s) performed: None Procedures Critical Care performed:  None ____________________________________________   INITIAL IMPRESSION / ASSESSMENT AND PLAN / ED COURSE  76 y.o. female with history of diabetes, hypertension, CVA who presents for evaluation of intermittent vertigo for 24 hours. Patient is neurologically intact on exam. She is severely hypertensive with systolics between 190 and 240. She reports that she has taken her medications this evening. Also complaining of a mild headache. Patient is able to ambulate without difficulties. We'll get a head CT to rule out posterior fossa stroke. We'll check basic blood  work.  Clinical Course as of May 20 322  Thu May 19, 2016  0303 Head CT and MRI were no acute findings. Blood work showing stable creatinine. UA concerning for urinary tract infection. Patient was given a dose of Rocephin and will be discharged home on Keflex. BP was elevated however patient had not taken the PM dose of hydralazine. BP trending down after hydralazine. Ambulating with no difficulties. Recommended close f/u with PCP in 24 hours for recheck.  [CV]    Clinical Course User Index [CV] Nita Sickle, MD    Pertinent labs & imaging results that were available during my care of the patient were reviewed by me and considered in my medical decision making (see chart for details).    ____________________________________________   FINAL CLINICAL IMPRESSION(S) / ED DIAGNOSES  Final diagnoses:  Vertigo  Acute cystitis with hematuria      NEW MEDICATIONS STARTED DURING THIS VISIT:  New Prescriptions   CEPHALEXIN (KEFLEX) 500 MG CAPSULE    Take 1 capsule (500 mg total) by mouth 3 (three) times daily.     Note:  This document was prepared using Dragon voice recognition software and may include unintentional dictation errors.    Nita Sickle, MD 05/19/16 [id number]

## 2016-05-19 ENCOUNTER — Emergency Department: Payer: Medicare Other

## 2016-05-19 DIAGNOSIS — N3001 Acute cystitis with hematuria: Secondary | ICD-10-CM | POA: Diagnosis not present

## 2016-05-19 MED ORDER — CEFTRIAXONE SODIUM-DEXTROSE 1-3.74 GM-% IV SOLR
1.0000 g | Freq: Once | INTRAVENOUS | Status: AC
  Administered 2016-05-19: 1 g via INTRAVENOUS
  Filled 2016-05-19: qty 50

## 2016-05-19 MED ORDER — CEPHALEXIN 500 MG PO CAPS: 500.0000 mg | ORAL_CAPSULE | Freq: Three times a day (TID) | ORAL | 0 refills | Status: AC

## 2016-05-19 MED ORDER — HYDRALAZINE HCL 50 MG PO TABS
ORAL_TABLET | ORAL | Status: AC
  Administered 2016-05-19: 50 mg via ORAL
  Filled 2016-05-19: qty 1

## 2016-05-19 MED ORDER — HYDRALAZINE HCL 50 MG PO TABS
50.0000 mg | ORAL_TABLET | Freq: Once | ORAL | Status: AC
  Administered 2016-05-19: 50 mg via ORAL

## 2016-05-19 MED ORDER — DEXTROSE 5 % IV SOLN: 1.0000 g | Freq: Once | INTRAVENOUS | Status: DC

## 2016-05-19 MED ORDER — HYDRALAZINE HCL 20 MG/ML IJ SOLN
10.0000 mg | Freq: Once | INTRAMUSCULAR | Status: AC
  Administered 2016-05-19: 10 mg via INTRAVENOUS
  Filled 2016-05-19: qty 1

## 2016-05-19 NOTE — Discharge Instructions (Signed)

## 2016-05-21 LAB — URINE CULTURE: Culture: >=100000 — AB

## 2016-08-14 ENCOUNTER — Emergency Department: Payer: Medicare Other

## 2016-08-14 ENCOUNTER — Emergency Department
Admission: EM | Admit: 2016-08-14 | Discharge: 2016-08-14 | Disposition: A | Discharge status: Home / Self Care | Payer: Medicare Other | Attending: Emergency Medicine | Admitting: Emergency Medicine

## 2016-08-14 DIAGNOSIS — Z7902 Long term (current) use of antithrombotics/antiplatelets: Secondary | ICD-10-CM | POA: Diagnosis not present

## 2016-08-14 DIAGNOSIS — Z8673 Personal history of transient ischemic attack (TIA), and cerebral infarction without residual deficits: Secondary | ICD-10-CM | POA: Diagnosis not present

## 2016-08-14 DIAGNOSIS — Z79899 Other long term (current) drug therapy: Secondary | ICD-10-CM | POA: Insufficient documentation

## 2016-08-14 DIAGNOSIS — M25531 Pain in right wrist: Secondary | ICD-10-CM | POA: Diagnosis not present

## 2016-08-14 DIAGNOSIS — I10 Essential (primary) hypertension: Secondary | ICD-10-CM | POA: Insufficient documentation

## 2016-08-14 DIAGNOSIS — L03119 Cellulitis of unspecified part of limb: Secondary | ICD-10-CM

## 2016-08-14 DIAGNOSIS — L03113 Cellulitis of right upper limb: Secondary | ICD-10-CM | POA: Diagnosis not present

## 2016-08-14 DIAGNOSIS — Z7984 Long term (current) use of oral hypoglycemic drugs: Secondary | ICD-10-CM | POA: Insufficient documentation

## 2016-08-14 DIAGNOSIS — E119 Type 2 diabetes mellitus without complications: Secondary | ICD-10-CM | POA: Diagnosis not present

## 2016-08-14 MED ORDER — TRAMADOL HCL 50 MG PO TABS: 50.0000 mg | ORAL_TABLET | Freq: Four times a day (QID) | ORAL | 0 refills | Status: AC | PRN

## 2016-08-14 MED ORDER — TRAMADOL HCL 50 MG PO TABS
50.0000 mg | ORAL_TABLET | Freq: Once | ORAL | Status: AC
  Administered 2016-08-14: 50 mg via ORAL
  Filled 2016-08-14: qty 1

## 2016-08-14 MED ORDER — CEPHALEXIN 500 MG PO CAPS: 500.0000 mg | ORAL_CAPSULE | Freq: Three times a day (TID) | ORAL | 0 refills | Status: AC

## 2016-08-14 NOTE — ED Triage Notes (Signed)
Pt c/o right wrist pain - pt denies any injury to the wrist but it is red/swollen and painful to touch or move

## 2016-08-14 NOTE — ED Notes (Signed)
Pt with redness and swelling to back of right hand/wrist area; denies injury or insect bite; slight warmth to area; no streaks radiating up arm; pt c/o pain for a few days but symptoms changed today; denies fever; pain increases with movement

## 2016-08-14 NOTE — Discharge Instructions (Signed)
Tape prescriptions as directed. Continue to monitor wrist pain and swelling. If no improvement in symptoms in the next 2-3 days follow-up with your primary care provider. If symptoms acutely worsen return to emergency department.

## 2016-08-14 NOTE — ED Notes (Signed)
Splint already applied by PA Traci

## 2016-08-14 NOTE — ED Provider Notes (Signed)
[facility] Emergency Department Provider Note   ____________________________________________   I have reviewed the triage vital signs and the nursing notes.   HISTORY  Chief Complaint Wrist Pain    HPI Gina Kelly is a 76 y.o. female presents to emergency department with right wrist pain, swelling over the radiounlar joint and erythema without traumatic injury. Patient and family present reported patient may have strained her wrist lifting her grandchild. Swelling over the dorsal aspect of the wrist is erythematous and hot to touch. Patient recently spent time with her sister to passed away several days ago. Patient reported the sister having MRSA. She is unsure if she had come into contact with MRSA. Patient denies fever, chills, headache, vision changes, chest pain, chest tightness, shortness of breath, abdominal pain, nausea and vomiting.  Past Medical History:  Diagnosis Date  . Diabetes mellitus without complication (HCC)   . Hypertension   . Stroke [facility](HCC)    2000    There are no active problems to display for this patient.   Past Surgical History:  Procedure Laterality Date  . ABDOMINAL HYSTERECTOMY      Prior to Admission medications   Medication Sig Start Date End Date Taking? Authorizing Provider  albuterol (PROVENTIL HFA;VENTOLIN HFA) 108 (90 BASE) MCG/ACT inhaler Inhale 2 puffs into the lungs every 6 (six) hours as needed for wheezing or shortness of breath. 07/28/14   Menshew, Charlesetta IvoryJenise V Bacon, PA-C  atorvastatin (LIPITOR) 20 MG tablet Take 20 mg by mouth daily.    [provider]  benzonatate (TESSALON PERLES) 100 MG capsule Take 1 capsule (100 mg total) by mouth 3 (three) times daily as needed for cough (Take 1-2 per dose). 07/28/14   Menshew, Charlesetta IvoryJenise V Bacon, PA-C  cephALEXin (KEFLEX) 500 MG capsule Take 1 capsule (500 mg total) by mouth 3 (three) times daily. 08/14/16 08/24/16  Long Brimage M, PA-C  clopidogrel (PLAVIX) 75 MG tablet  Take 75 mg by mouth daily.    [provider]  glipiZIDE (GLUCOTROL) 5 MG tablet Take by mouth daily before breakfast.    [provider]  hydrochlorothiazide (HYDRODIURIL) 25 MG tablet Take 25 mg by mouth daily.    [provider]  metFORMIN (GLUCOPHAGE) 500 MG tablet Take by mouth 2 (two) times daily with a meal.    [provider]  metoprolol succinate (TOPROL-XL) 25 MG 24 hr tablet Take 25 mg by mouth daily.    [provider]  oseltamivir (TAMIFLU) 75 MG capsule Take 1 capsule (75 mg total) by mouth 2 (two) times daily. 03/08/15   Minna AntisPaduchowski, Kevin, MD  traMADol (ULTRAM) 50 MG tablet Take 1 tablet (50 mg total) by mouth every 6 (six) hours as needed. 08/14/16 08/14/17  Dairon Procter, Gina Likesraci M, PA-C    Allergies Patient has no known allergies.  No family history on file.  Social History Social History  Substance Use Topics  . Smoking status: Never Smoker  . Smokeless tobacco: Never Used  . Alcohol use No    Review of Systems Constitutional: Negative for fever/chills Eyes: No visual changes. ENT:  Negative for sore throat and for difficulty swallowing Cardiovascular: Denies chest pain. Respiratory: Denies cough. Denies shortness of breath. Gastrointestinal: No abdominal pain.  No nausea, vomiting, diarrhea. Musculoskeletal: Right wrist pain and swelling. Skin: Negative for rash. Dorsal aspect of the wrist erythema and swelling. Neurological: Negative for headaches.  ____________________________________________   PHYSICAL EXAM:  VITAL SIGNS: ED Triage Vitals [08/14/16 1810]  Enc Vitals Group  BP (!) 172/65     Pulse Rate 86     Resp 15     Temp 98.6 F (37 C)     Temp Source Oral     SpO2 96 %     Weight 135 lb (61.2 kg)     Height 5\' 2"  (1.575 m)     Head Circumference      Peak Flow      Pain Score 8     Pain Loc      Pain Edu?      Excl. in GC?     Constitutional: Alert and oriented. Well appearing and in no acute  distress.  Head: Normocephalic and atraumatic. Eyes: Conjunctivae are normal. PERRL. Normal extraocular movements.  Cardiovascular: Normal rate, regular rhythm. Normal distal pulses. Respiratory: Normal respiratory effort.  Musculoskeletal: Right wrist range of motion intact with severe pain with movement. Intact sensation of the left upper upper extremity. Swelling noted over the dorsal aspect of the radioulnar joint/radio and ulnar carpal joint Otherwise, nontender with normal range of motion in all extremities. Neurologic: Normal speech and language.  Skin:  Skin is warm, dry and intact. No rash noted. Significant swelling and erythema over the dorsal aspect of the right wrist joint. Psychiatric: Mood and affect are normal.  ____________________________________________   LABS (all labs ordered are listed, but only abnormal results are displayed)  Labs Reviewed - No data to display ____________________________________________  EKG  none ____________________________________________  RADIOLOGY  DG wrist complete right FINDINGS: No evidence for an acute fracture. No subluxation or dislocation. No worrisome lytic or sclerotic osseous abnormality. Overlying soft tissues are unremarkable.  IMPRESSION: Negative. ____________________________________________   PROCEDURES  Procedure(s) performed: SPLINT APPLICATION Date/Time: 11:11 PM Authorized by: Clois Comber Consent: Verbal consent obtained. Risks and benefits: risks, benefits and alternatives were discussed Consent given by: patient Splint applied by: Gina Kelly Location details: Right wrist  Splint type: Velcro wrist splint, volar  Supplies used: Velcro wrist splint  Post-procedure: The splinted body part was neurovascularly unchanged following the procedure. Patient tolerance: Patient tolerated the procedure well with no immediate complications.       Critical Care performed:  no ____________________________________________   INITIAL IMPRESSION / ASSESSMENT AND PLAN / ED COURSE  Pertinent labs & imaging results that were available during my care of the patient were reviewed by me and considered in my medical decision making (see chart for details).  Patient presents to emergency department with right wrist pain, erythema and swelling over the dorsal aspect of the wrist. History, physical exam findings and imaging are reassuring of no acute fracture or neurovascular injury. Patient noted mild relief with dose of tramadol given during the course of care in the emergency department.  There is no relationship between wrist pain and traumatic injury however patient may have sustained a micro-injury to cause a wrist sprain. Patient also has a known source of MRSA exposure and now has appearance of a skin infection along the dorsal aspect of the right wrist. Patient will be prescribed tramadol for pain management and a course of cephalexin for antibiotic coverage. Applied Velcro wrist splint for support and comfort. Patient advised to follow up with PCP as needed or return to the emergency department if symptoms return or worsen. Patient informed of clinical course, understand medical decision-making process, and agree with plan.     If controlled substance prescribed during this visit, 12 month history viewed on the [facility] prior to issuing an initial prescription  for Schedule II or III opiod. ____________________________________________   FINAL CLINICAL IMPRESSION(S) / ED DIAGNOSES  Final diagnoses:  Right wrist pain  Cellulitis of wrist       NEW MEDICATIONS STARTED DURING THIS VISIT:  Discharge Medication List as of 08/14/2016  8:30 PM    START taking these medications   Details  cephALEXin (KEFLEX) 500 MG capsule Take 1 capsule (500 mg total) by mouth 3 (three) times daily., Starting Sun 08/14/2016, Until Wed 08/24/2016, Print    traMADol (ULTRAM) 50 MG tablet  Take 1 tablet (50 mg total) by mouth every 6 (six) hours as needed., Starting Sun 08/14/2016, Until Mon 08/14/2017, Print         Note:  This document was prepared using Dragon voice recognition software and may include unintentional dictation errors.    Percell Boston 08/14/16 2311    Sharman Cheek, MD 08/14/16 [phone]

## 2017-12-17 ENCOUNTER — Other Ambulatory Visit: Payer: Self-pay

## 2017-12-17 ENCOUNTER — Emergency Department
Admission: EM | Admit: 2017-12-17 | Discharge: 2017-12-17 | Disposition: A | Discharge status: Home / Self Care | Payer: Medicare Other | Attending: Emergency Medicine | Admitting: Emergency Medicine

## 2017-12-17 ENCOUNTER — Encounter: Payer: Self-pay | Admitting: Emergency Medicine

## 2017-12-17 ENCOUNTER — Emergency Department: Payer: Medicare Other

## 2017-12-17 DIAGNOSIS — N390 Urinary tract infection, site not specified: Secondary | ICD-10-CM

## 2017-12-17 DIAGNOSIS — R42 Dizziness and giddiness: Secondary | ICD-10-CM | POA: Diagnosis present

## 2017-12-17 LAB — TROPONIN I: Troponin I: <0.03 ng/mL (ref <0.03)

## 2017-12-17 LAB — CBC
HEMATOCRIT: 35.3 % — AB (ref 36.0–46.0)
HEMOGLOBIN: 12 g/dL (ref 12.0–15.0)
MCH: 28.6 pg (ref 26.0–34.0)
MCHC: 34 g/dL (ref 30.0–36.0)
MCV: 84.2 fL (ref 80.0–100.0)
Platelets: 201 10*3/uL (ref 150–400)
RBC: 4.19 MIL/uL (ref 3.87–5.11)
RDW: 14.1 % (ref 11.5–15.5)
WBC: 8.5 10*3/uL (ref 4.0–10.5)
nRBC: 0 % (ref 0.0–0.2)

## 2017-12-17 LAB — BASIC METABOLIC PANEL
Anion gap: 13 (ref 5–15)
BUN: 40 mg/dL — AB (ref 8–23)
CHLORIDE: 104 mmol/L (ref 98–111)
CO2: 22 mmol/L (ref 22–32)
CREATININE: 1.38 mg/dL — AB (ref 0.44–1.00)
Calcium: 8.9 mg/dL (ref 8.9–10.3)
GFR calc Af Amer: 42 mL/min — ABNORMAL LOW (ref >60)
GFR calc non Af Amer: 36 mL/min — ABNORMAL LOW (ref >60)
GLUCOSE: 253 mg/dL — AB (ref 70–99)
POTASSIUM: 4.5 mmol/L (ref 3.5–5.1)
Sodium: 139 mmol/L (ref 135–145)

## 2017-12-17 LAB — URINALYSIS, COMPLETE (UACMP) WITH MICROSCOPIC
Bilirubin Urine: NEGATIVE
GLUCOSE, UA: NEGATIVE mg/dL
Hgb urine dipstick: NEGATIVE
Ketones, ur: NEGATIVE mg/dL
NITRITE: NEGATIVE
PH: 5 (ref 5.0–8.0)
Protein, ur: NEGATIVE mg/dL
SPECIFIC GRAVITY, URINE: 1.01 (ref 1.005–1.030)

## 2017-12-17 MED ORDER — CEPHALEXIN 500 MG PO CAPS: 500.0000 mg | ORAL_CAPSULE | Freq: Two times a day (BID) | ORAL | 0 refills | Status: DC

## 2017-12-17 MED ORDER — MECLIZINE HCL 25 MG PO TABS
12.5000 mg | ORAL_TABLET | Freq: Once | ORAL | Status: AC
  Administered 2017-12-17: 12.5 mg via ORAL
  Filled 2017-12-17: qty 1

## 2017-12-17 MED ORDER — SODIUM CHLORIDE 0.9 % IV SOLN
1.0000 g | Freq: Once | INTRAVENOUS | Status: AC
  Administered 2017-12-17: 1 g via INTRAVENOUS
  Filled 2017-12-17: qty 10

## 2017-12-17 MED ORDER — SODIUM CHLORIDE 0.9 % IV SOLN
1000.0000 mL | Freq: Once | INTRAVENOUS | Status: AC
  Administered 2017-12-17: 1000 mL via INTRAVENOUS

## 2017-12-17 NOTE — ED Triage Notes (Signed)
C?O dizziness x 2-3 days.  States symptoms worsen when sitting or standing and when laying down at night time.  Patietn isAAOx3.  Skin warm and dyr.  MAE equally and strong. NAD

## 2017-12-17 NOTE — ED Provider Notes (Signed)
[facility] Emergency Department Provider Note   ____________________________________________    I have reviewed the triage vital signs and the nursing notes.   HISTORY  Chief Complaint Dizziness     HPI Gina Kelly is a 77 y.o. female with a history of diabetes and hypertension who presents today with complaints of dizziness.  Patient reports that she has felt dizzy for "a couple of days ".  She reports feeling "swimmy headed ".  Denies vertigo.  Symptoms typically arise when she goes from sitting to standing.  Denies headache.  No neuro deficits.  Does report some mild dysuria and frequency.  No fevers or chills or abdominal pain.  No chest pain.   Past Medical History:  Diagnosis Date  . Diabetes mellitus without complication (HCC)   . Hypertension   . Stroke [facility])    2000    There are no active problems to display for this patient.   Past Surgical History:  Procedure Laterality Date  . ABDOMINAL HYSTERECTOMY      Prior to Admission medications   Medication Sig Start Date End Date Taking? Authorizing Provider  albuterol (PROVENTIL HFA;VENTOLIN HFA) 108 (90 BASE) MCG/ACT inhaler Inhale 2 puffs into the lungs every 6 (six) hours as needed for wheezing or shortness of breath. 07/28/14   Menshew, Charlesetta Ivory, PA-C  atorvastatin (LIPITOR) 20 MG tablet Take 20 mg by mouth daily.    [provider]  benzonatate (TESSALON PERLES) 100 MG capsule Take 1 capsule (100 mg total) by mouth 3 (three) times daily as needed for cough (Take 1-2 per dose). 07/28/14   Menshew, Charlesetta Ivory, PA-C  cephALEXin (KEFLEX) 500 MG capsule Take 1 capsule (500 mg total) by mouth 2 (two) times daily. 12/17/17   Jene Every, MD  clopidogrel (PLAVIX) 75 MG tablet Take 75 mg by mouth daily.    [provider]  glipiZIDE (GLUCOTROL) 5 MG tablet Take by mouth daily before breakfast.    [provider]  hydrochlorothiazide (HYDRODIURIL) 25 MG  tablet Take 25 mg by mouth daily.    [provider]  metFORMIN (GLUCOPHAGE) 500 MG tablet Take by mouth 2 (two) times daily with a meal.    [provider]  metoprolol succinate (TOPROL-XL) 25 MG 24 hr tablet Take 25 mg by mouth daily.    [provider]  oseltamivir (TAMIFLU) 75 MG capsule Take 1 capsule (75 mg total) by mouth 2 (two) times daily. 03/08/15   Minna Antis, MD     Allergies Patient has no known allergies.  No family history on file.  Social History Social History   Tobacco Use  . Smoking status: Never Smoker  . Smokeless tobacco: Never Used  Substance Use Topics  . Alcohol use: No  . Drug use: No    Review of Systems  Constitutional: Dizziness as above Eyes: No visual changes.  ENT: No sore throat. Cardiovascular: Denies chest pain.  Palpitations Respiratory: Denies shortness of breath. Gastrointestinal: No abdominal pain.   Genitourinary: As above Musculoskeletal: Negative for back pain. Skin: Negative for rash. Neurological: Negative for headaches   ____________________________________________   PHYSICAL EXAM:  VITAL SIGNS: ED Triage Vitals  Enc Vitals Group     BP 12/17/17 1634 (!) 173/63     Pulse Rate 12/17/17 1634 64     Resp 12/17/17 1634 14     Temp 12/17/17 1634 98.5 F (36.9 C)     Temp Source 12/17/17 1634 Oral  SpO2 12/17/17 1634 97 %     Weight 12/17/17 1638 61.2 kg (134 lb 14.7 oz)     Height --      Head Circumference --      Peak Flow --      Pain Score 12/17/17 1638 0     Pain Loc --      Pain Edu? --      Excl. in GC? --     Constitutional: Alert and oriented. Eyes: Conjunctivae are normal.   Nose: No congestion/rhinnorhea. Mouth/Throat: Mucous membranes are moist.    Cardiovascular: Normal rate, regular rhythm. Grossly normal heart sounds.  Good peripheral circulation. Respiratory: Normal respiratory effort. Gastrointestinal: Soft and nontender. No  distention.  Musculoskeletal:   Warm and well perfused Neurologic:  Normal speech and language. No gross focal neurologic deficits are appreciated.  Skin:  Skin is warm, dry and intact. No rash noted. Psychiatric: Mood and affect are normal. Speech and behavior are normal.  ____________________________________________   LABS (all labs ordered are listed, but only abnormal results are displayed)  Labs Reviewed  BASIC METABOLIC PANEL - Abnormal; Notable for the following components:      Result Value   Glucose, Bld 253 (*)    BUN 40 (*)    Creatinine, Ser 1.38 (*)    GFR calc non Af Amer 36 (*)    GFR calc Af Amer 42 (*)    All other components within normal limits  CBC - Abnormal; Notable for the following components:   HCT 35.3 (*)    All other components within normal limits  URINALYSIS, COMPLETE (UACMP) WITH MICROSCOPIC - Abnormal; Notable for the following components:   Color, Urine YELLOW (*)    APPearance CLOUDY (*)    Leukocytes, UA LARGE (*)    WBC, UA >50 (*)    Bacteria, UA MANY (*)    All other components within normal limits  URINE CULTURE  TROPONIN I  CBG MONITORING, ED   ____________________________________________  EKG  ED ECG REPORT I, Jene Every, the attending physician, personally viewed and interpreted this ECG.  Date: 12/17/2017  Rhythm: normal sinus rhythm QRS Axis: normal Intervals: normal ST/T Wave abnormalities: normal Narrative Interpretation: no evidence of acute ischemia  ____________________________________________  RADIOLOGY  CT head unremarkable ____________________________________________   PROCEDURES  Procedure(s) performed: No  Procedures   Critical Care performed: No ____________________________________________   INITIAL IMPRESSION / ASSESSMENT AND PLAN / ED COURSE  Pertinent labs & imaging results that were available during my care of the patient were reviewed by me and considered in my medical decision making  (see chart for details).  Patient presents with dizziness/lightheadedness with postural changes.  We will give IV fluids, check labs, CT given history of CVA.  Lab work is overall reassuring, no significant change in BUN/creatinine from prior results, mildly elevated glucose.  CT head is unremarkable.  Otherwise lab work is significant for urinary tract infection.  Patient treated with IV Rocephin along with IV fluids.   After fluid patient reports feeling markedly better, she is able to ablate without any dizziness.  At this point feel patient is appropriate for discharge, she will follow-up with PCP regarding elevated blood pressure.    ____________________________________________   FINAL CLINICAL IMPRESSION(S) / ED DIAGNOSES  Final diagnoses:  Dizziness  Lower urinary tract infectious disease        Note:  This document was prepared using Dragon voice recognition software and may include unintentional dictation errors.  Jene EveryKinner, Corinthia Helmers, MD 12/17/17 2250

## 2017-12-17 NOTE — ED Notes (Signed)
Dizziness x 3 days. Hx of stroke.

## 2017-12-20 LAB — URINE CULTURE: Culture: >=100000 — AB

## 2018-01-27 ENCOUNTER — Other Ambulatory Visit: Payer: Self-pay

## 2018-01-27 DIAGNOSIS — N390 Urinary tract infection, site not specified: Secondary | ICD-10-CM | POA: Diagnosis not present

## 2018-01-27 DIAGNOSIS — I10 Essential (primary) hypertension: Secondary | ICD-10-CM | POA: Diagnosis not present

## 2018-01-27 DIAGNOSIS — Z79899 Other long term (current) drug therapy: Secondary | ICD-10-CM | POA: Insufficient documentation

## 2018-01-27 DIAGNOSIS — R42 Dizziness and giddiness: Secondary | ICD-10-CM | POA: Insufficient documentation

## 2018-01-27 DIAGNOSIS — E860 Dehydration: Secondary | ICD-10-CM | POA: Diagnosis not present

## 2018-01-27 LAB — URINALYSIS, COMPLETE (UACMP) WITH MICROSCOPIC
Bilirubin Urine: NEGATIVE
GLUCOSE, UA: NEGATIVE mg/dL
Hgb urine dipstick: NEGATIVE
KETONES UR: NEGATIVE mg/dL
NITRITE: POSITIVE — AB
PROTEIN: NEGATIVE mg/dL
SPECIFIC GRAVITY, URINE: 1.012 (ref 1.005–1.030)
WBC, UA: >50 WBC/hpf — ABNORMAL HIGH (ref 0–5)
pH: 5 (ref 5.0–8.0)

## 2018-01-27 LAB — CBC
HCT: 34.8 % — ABNORMAL LOW (ref 36.0–46.0)
HEMOGLOBIN: 11.5 g/dL — AB (ref 12.0–15.0)
MCH: 28 pg (ref 26.0–34.0)
MCHC: 33 g/dL (ref 30.0–36.0)
MCV: 84.7 fL (ref 80.0–100.0)
NRBC: 0 % (ref 0.0–0.2)
PLATELETS: 202 10*3/uL (ref 150–400)
RBC: 4.11 MIL/uL (ref 3.87–5.11)
RDW: 13.9 % (ref 11.5–15.5)
WBC: 7.7 10*3/uL (ref 4.0–10.5)

## 2018-01-27 LAB — BASIC METABOLIC PANEL
Anion gap: 9 (ref 5–15)
BUN: 47 mg/dL — AB (ref 8–23)
CO2: 19 mmol/L — ABNORMAL LOW (ref 22–32)
Calcium: 8.2 mg/dL — ABNORMAL LOW (ref 8.9–10.3)
Chloride: 104 mmol/L (ref 98–111)
Creatinine, Ser: 1.52 mg/dL — ABNORMAL HIGH (ref 0.44–1.00)
GFR calc Af Amer: 38 mL/min — ABNORMAL LOW (ref >60)
GFR calc non Af Amer: 33 mL/min — ABNORMAL LOW (ref >60)
Glucose, Bld: 236 mg/dL — ABNORMAL HIGH (ref 70–99)
Potassium: 4.7 mmol/L (ref 3.5–5.1)
Sodium: 132 mmol/L — ABNORMAL LOW (ref 135–145)

## 2018-01-27 NOTE — ED Triage Notes (Signed)
Patient reports "I've been dizzy headed for about 3 days and my heads been hurting for 2 or 3 days).  States room spinning sensation all the time.

## 2018-01-28 ENCOUNTER — Emergency Department
Admission: EM | Admit: 2018-01-28 | Discharge: 2018-01-28 | Disposition: A | Discharge status: Home / Self Care | Payer: Medicare Other | Attending: Emergency Medicine | Admitting: Emergency Medicine

## 2018-01-28 ENCOUNTER — Emergency Department: Payer: Medicare Other

## 2018-01-28 DIAGNOSIS — N390 Urinary tract infection, site not specified: Secondary | ICD-10-CM

## 2018-01-28 DIAGNOSIS — I10 Essential (primary) hypertension: Secondary | ICD-10-CM

## 2018-01-28 DIAGNOSIS — R42 Dizziness and giddiness: Secondary | ICD-10-CM

## 2018-01-28 DIAGNOSIS — E860 Dehydration: Secondary | ICD-10-CM

## 2018-01-28 MED ORDER — CIPROFLOXACIN HCL 500 MG PO TABS
500.0000 mg | ORAL_TABLET | Freq: Once | ORAL | Status: AC
  Administered 2018-01-28: 500 mg via ORAL
  Filled 2018-01-28: qty 1

## 2018-01-28 MED ORDER — CIPROFLOXACIN HCL 250 MG PO TABS: 250.0000 mg | ORAL_TABLET | Freq: Two times a day (BID) | ORAL | 0 refills | Status: AC

## 2018-01-28 NOTE — Discharge Instructions (Addendum)
1.  Take antibiotic as prescribed (Cipro 250 mg twice daily x7 days). 2.  Please take your blood pressure medicine when you get home. 3.  Return to the ER for worsening symptoms, persistent vomiting, lethargy, difficulty breathing or other concerns.

## 2018-01-28 NOTE — ED Notes (Signed)
Pt daughter states that pt missed her 10:00pm dose of blood pressure medication due to being here at hospital.

## 2018-01-28 NOTE — ED Notes (Signed)
Pt states that she has had dizziness for the past three days along with a HA. Pt alert and oriented x 4. Family at bedside

## 2018-01-28 NOTE — ED Provider Notes (Signed)
[facility] Emergency Department Provider Note   ____________________________________________   First MD Initiated Contact with Patient 01/28/18 0113     (approximate)  I have reviewed the triage vital signs and the nursing notes.   HISTORY  Chief Complaint Dizziness    HPI Gina Kelly is a 78 y.o. female brought to the ED from home by her daughter with a chief complaint of dizziness.  Patient reports sensation of room spinning for the past 3 days.  Symptoms associated with mild, gradual onset global headache.  Denies associated vision changes, neck pain, chest pain, shortness of breath, abdominal pain, nausea, vomiting, dysuria, diarrhea.  Patient takes Plavix.  Denies recent travel or trauma.   Past Medical History:  Diagnosis Date  . Diabetes mellitus without complication (HCC)   . Hypertension   . Stroke [facility](HCC)    2000    There are no active problems to display for this patient.   Past Surgical History:  Procedure Laterality Date  . ABDOMINAL HYSTERECTOMY      Prior to Admission medications   Medication Sig Start Date End Date Taking? Authorizing Provider  albuterol (PROVENTIL HFA;VENTOLIN HFA) 108 (90 BASE) MCG/ACT inhaler Inhale 2 puffs into the lungs every 6 (six) hours as needed for wheezing or shortness of breath. 07/28/14   Menshew, Charlesetta IvoryJenise V Bacon, PA-C  atorvastatin (LIPITOR) 20 MG tablet Take 20 mg by mouth daily.    [provider]  benzonatate (TESSALON PERLES) 100 MG capsule Take 1 capsule (100 mg total) by mouth 3 (three) times daily as needed for cough (Take 1-2 per dose). 07/28/14   Menshew, Charlesetta IvoryJenise V Bacon, PA-C  cephALEXin (KEFLEX) 500 MG capsule Take 1 capsule (500 mg total) by mouth 2 (two) times daily. 12/17/17   Jene EveryKinner, Robert, MD  ciprofloxacin (CIPRO) 250 MG tablet Take 1 tablet (250 mg total) by mouth 2 (two) times daily for 7 days. 01/28/18 02/04/18  Irean HongSung, Daisuke Bailey J, MD  clopidogrel (PLAVIX) 75 MG tablet Take 75 mg by  mouth daily.    [provider]  glipiZIDE (GLUCOTROL) 5 MG tablet Take by mouth daily before breakfast.    [provider]  hydrochlorothiazide (HYDRODIURIL) 25 MG tablet Take 25 mg by mouth daily.    [provider]  metFORMIN (GLUCOPHAGE) 500 MG tablet Take by mouth 2 (two) times daily with a meal.    [provider]  metoprolol succinate (TOPROL-XL) 25 MG 24 hr tablet Take 25 mg by mouth daily.    [provider]  oseltamivir (TAMIFLU) 75 MG capsule Take 1 capsule (75 mg total) by mouth 2 (two) times daily. 03/08/15   Minna AntisPaduchowski, Kevin, MD    Allergies Patient has no known allergies.  No family history on file.  Social History Social History   Tobacco Use  . Smoking status: Never Smoker  . Smokeless tobacco: Never Used  Substance Use Topics  . Alcohol use: No  . Drug use: No    Review of Systems  Constitutional: No fever/chills Eyes: No visual changes. ENT: No sore throat. Cardiovascular: Denies chest pain. Respiratory: Denies shortness of breath. Gastrointestinal: No abdominal pain.  No nausea, no vomiting.  No diarrhea.  No constipation. Genitourinary: Negative for dysuria. Musculoskeletal: Negative for back pain. Skin: Negative for rash. Neurological: Positive for dizziness.  Negative for headaches, focal weakness or numbness.   ____________________________________________   PHYSICAL EXAM:  VITAL SIGNS: ED Triage Vitals  Enc Vitals Group     BP 01/27/18 2021 Marland Kitchen(!)  170/58     Pulse Rate 01/27/18 2021 (!) 58     Resp 01/27/18 2021 18     Temp 01/27/18 2021 98.3 F (36.8 C)     Temp Source 01/27/18 2021 Oral     SpO2 01/27/18 2021 97 %     Weight 01/27/18 2019 135 lb (61.2 kg)     Height 01/27/18 2019 5\' 2"  (1.575 m)     Head Circumference --      Peak Flow --      Pain Score 01/27/18 2018 5     Pain Loc --      Pain Edu? --      Excl. in GC? --     Constitutional: Alert and oriented. Well appearing and in  no acute distress. Eyes: Conjunctivae are normal. PERRL. EOMI. Head: Atraumatic. Nose: No congestion/rhinnorhea. Mouth/Throat: Mucous membranes are moist.  Edentulous. Neck: No stridor.  No carotid bruits. Cardiovascular: Normal rate, regular rhythm. Grossly normal heart sounds.  Good peripheral circulation. Respiratory: Normal respiratory effort.  No retractions. Lungs CTAB. Gastrointestinal: Soft and nontender. No distention. No abdominal bruits. No CVA tenderness. Musculoskeletal: No lower extremity tenderness nor edema.  No joint effusions. Neurologic: Alert and oriented x3.  CN II-XII grossly intact.  Normal speech and language. No gross focal neurologic deficits are appreciated. MAEx4. Skin:  Skin is warm, dry and intact. No rash noted. Psychiatric: Mood and affect are normal. Speech and behavior are normal.  ____________________________________________   LABS (all labs ordered are listed, but only abnormal results are displayed)  Labs Reviewed  BASIC METABOLIC PANEL - Abnormal; Notable for the following components:      Result Value   Sodium 132 (*)    CO2 19 (*)    Glucose, Bld 236 (*)    BUN 47 (*)    Creatinine, Ser 1.52 (*)    Calcium 8.2 (*)    GFR calc non Af Amer 33 (*)    GFR calc Af Amer 38 (*)    All other components within normal limits  CBC - Abnormal; Notable for the following components:   Hemoglobin 11.5 (*)    HCT 34.8 (*)    All other components within normal limits  URINALYSIS, COMPLETE (UACMP) WITH MICROSCOPIC - Abnormal; Notable for the following components:   Color, Urine YELLOW (*)    APPearance CLOUDY (*)    Nitrite POSITIVE (*)    Leukocytes, UA LARGE (*)    WBC, UA >50 (*)    Bacteria, UA MANY (*)    All other components within normal limits  URINE CULTURE   ____________________________________________  EKG  ED ECG REPORT I, Ramonica Grigg J, the attending physician, personally viewed and interpreted this ECG.   Date: 01/28/2018  EKG  Time: 2028  Rate: 59  Rhythm: normal EKG, normal sinus rhythm  Axis: Normal  Intervals:none  ST&T Change: Nonspecific  ____________________________________________  RADIOLOGY  ED MD interpretation: Patient refused CT head  Official radiology report(s): No results found.  ____________________________________________   PROCEDURES  Procedure(s) performed: None  Procedures  Critical Care performed: No  ____________________________________________   INITIAL IMPRESSION / ASSESSMENT AND PLAN / ED COURSE  As part of my medical decision making, I reviewed the following data within the electronic MEDICAL RECORD NUMBER History obtained from family, Nursing notes reviewed and incorporated, Labs reviewed, EKG interpreted, Old chart reviewed and Notes from prior ED visits   78 year old female who presents with dizziness.  History of hypertension, CVA on Plavix.  Differential  diagnosis includes but is not limited to ICH, CVA, metabolic, infectious etiologies, etc.  Patient refuses CT head.  Daughter states she just had one 2 weeks ago and daughter is pretty sure patient has a UTI.  Has recurrent UTIs, last treated approximately 1 month ago with Keflex.  I explained to the patient and the daughter that I am concerned for intracranial hemorrhage given that patient's blood pressure is so high, she is dizzy and on Plavix.  Daughter states patient missed her nightly dose of HCTZ and she was "messing with her blood pressure cuff".  States it is not unusual for patients systolic pressure to be in the 200s.  I explained risk/benefits of staying for thorough evaluation including checking a troponin given her elevated pressure, IV antibiotic, IV fluids for her renal insufficiency but both the patient and her daughter are eager for discharge home.  Daughter states she will call EMS tomorrow if her mother has any problems.  I asked the patient to take her blood pressure medicine as soon as she gets home and to  check her blood pressure during the day.  She states she hates having her blood pressure checked and refuses to do so.  Daughter states she will "make her do it".  Will place patient on Cipro for her UTI and add urine culture.  Very strict return precautions given.  Both verbalize understanding and agree with plan of care.      ____________________________________________   FINAL CLINICAL IMPRESSION(S) / ED DIAGNOSES  Final diagnoses:  Dizziness  Urinary tract infection without hematuria, site unspecified  Essential hypertension  Dehydration     ED Discharge Orders         Ordered    ciprofloxacin (CIPRO) 250 MG tablet  2 times daily     01/28/18 0145           Note:  This document was prepared using Dragon voice recognition software and may include unintentional dictation errors.    Irean Hong, MD 01/28/18 [phone]

## 2018-01-30 LAB — URINE CULTURE

## 2018-03-02 ENCOUNTER — Other Ambulatory Visit: Payer: Self-pay

## 2018-03-02 ENCOUNTER — Emergency Department
Admission: EM | Admit: 2018-03-02 | Discharge: 2018-03-02 | Disposition: A | Discharge status: Home / Self Care | Payer: Medicare Other | Attending: Emergency Medicine | Admitting: Emergency Medicine

## 2018-03-02 ENCOUNTER — Encounter: Payer: Self-pay | Admitting: Emergency Medicine

## 2018-03-02 ENCOUNTER — Emergency Department: Payer: Medicare Other

## 2018-03-02 DIAGNOSIS — R0602 Shortness of breath: Secondary | ICD-10-CM | POA: Diagnosis not present

## 2018-03-02 DIAGNOSIS — Z79899 Other long term (current) drug therapy: Secondary | ICD-10-CM | POA: Insufficient documentation

## 2018-03-02 DIAGNOSIS — Z7984 Long term (current) use of oral hypoglycemic drugs: Secondary | ICD-10-CM | POA: Insufficient documentation

## 2018-03-02 DIAGNOSIS — I10 Essential (primary) hypertension: Secondary | ICD-10-CM | POA: Diagnosis not present

## 2018-03-02 DIAGNOSIS — Z7902 Long term (current) use of antithrombotics/antiplatelets: Secondary | ICD-10-CM | POA: Insufficient documentation

## 2018-03-02 DIAGNOSIS — R05 Cough: Secondary | ICD-10-CM | POA: Diagnosis present

## 2018-03-02 DIAGNOSIS — E119 Type 2 diabetes mellitus without complications: Secondary | ICD-10-CM | POA: Diagnosis not present

## 2018-03-02 DIAGNOSIS — Z8673 Personal history of transient ischemic attack (TIA), and cerebral infarction without residual deficits: Secondary | ICD-10-CM | POA: Diagnosis not present

## 2018-03-02 DIAGNOSIS — J101 Influenza due to other identified influenza virus with other respiratory manifestations: Secondary | ICD-10-CM | POA: Insufficient documentation

## 2018-03-02 LAB — URINALYSIS, COMPLETE (UACMP) WITH MICROSCOPIC
BILIRUBIN URINE: NEGATIVE
Bacteria, UA: NONE SEEN
GLUCOSE, UA: NEGATIVE mg/dL
HGB URINE DIPSTICK: NEGATIVE
KETONES UR: NEGATIVE mg/dL
LEUKOCYTES UA: NEGATIVE
NITRITE: NEGATIVE
PH: 5 (ref 5.0–8.0)
PROTEIN: NEGATIVE mg/dL
Specific Gravity, Urine: 1.01 (ref 1.005–1.030)

## 2018-03-02 LAB — TROPONIN I

## 2018-03-02 LAB — CBC
HCT: 35.7 % — ABNORMAL LOW (ref 36.0–46.0)
Hemoglobin: 12.4 g/dL (ref 12.0–15.0)
MCH: 28.1 pg (ref 26.0–34.0)
MCHC: 34.7 g/dL (ref 30.0–36.0)
MCV: 80.8 fL (ref 80.0–100.0)
PLATELETS: 149 10*3/uL — AB (ref 150–400)
RBC: 4.42 MIL/uL (ref 3.87–5.11)
RDW: 13.4 % (ref 11.5–15.5)
WBC: 3.1 10*3/uL — ABNORMAL LOW (ref 4.0–10.5)
nRBC: 0 % (ref 0.0–0.2)

## 2018-03-02 LAB — COMPREHENSIVE METABOLIC PANEL
ALT: 43 U/L (ref 0–44)
ANION GAP: 10 (ref 5–15)
AST: 32 U/L (ref 15–41)
Albumin: 4 g/dL (ref 3.5–5.0)
Alkaline Phosphatase: 51 U/L (ref 38–126)
BUN: 46 mg/dL — ABNORMAL HIGH (ref 8–23)
CHLORIDE: 96 mmol/L — AB (ref 98–111)
CO2: 23 mmol/L (ref 22–32)
CREATININE: 1.55 mg/dL — AB (ref 0.44–1.00)
Calcium: 8.9 mg/dL (ref 8.9–10.3)
GFR calc Af Amer: 37 mL/min — ABNORMAL LOW (ref >60)
GFR, EST NON AFRICAN AMERICAN: 32 mL/min — AB (ref >60)
Glucose, Bld: 237 mg/dL — ABNORMAL HIGH (ref 70–99)
Potassium: 4.1 mmol/L (ref 3.5–5.1)
SODIUM: 129 mmol/L — AB (ref 135–145)
Total Bilirubin: 0.6 mg/dL (ref 0.3–1.2)
Total Protein: 7.4 g/dL (ref 6.5–8.1)

## 2018-03-02 LAB — BRAIN NATRIURETIC PEPTIDE: B Natriuretic Peptide: 88 pg/mL (ref 0.0–100.0)

## 2018-03-02 LAB — INFLUENZA PANEL BY PCR (TYPE A & B)
INFLAPCR: NEGATIVE
INFLBPCR: POSITIVE — AB

## 2018-03-02 MED ORDER — SODIUM CHLORIDE 0.9 % IV BOLUS
500.0000 mL | Freq: Once | INTRAVENOUS | Status: AC
  Administered 2018-03-02: 500 mL via INTRAVENOUS

## 2018-03-02 MED ORDER — OSELTAMIVIR PHOSPHATE 30 MG PO CAPS: 30.0000 mg | ORAL_CAPSULE | Freq: Every day | ORAL | 0 refills | Status: AC

## 2018-03-02 MED ORDER — OSELTAMIVIR PHOSPHATE 30 MG PO CAPS
30.0000 mg | ORAL_CAPSULE | Freq: Once | ORAL | Status: AC
  Administered 2018-03-02: 30 mg via ORAL
  Filled 2018-03-02: qty 1

## 2018-03-02 NOTE — ED Notes (Signed)
See triage note  Presents with body aches and subjective fever for about 3 days   Afebrile on arrival

## 2018-03-02 NOTE — ED Provider Notes (Signed)
[facility] Emergency Department Provider Note  ____________________________________________  Time seen: Approximately 1:15 PM  I have reviewed the triage vital signs and the nursing notes.   HISTORY  Chief Complaint Influenza    HPI Gina Kelly is a 78 y.o. female that presents to emergency department for loss of appetite and cough for 2 to 3 days.  Family member states that patient's blood pressure medication was doubled last week.  She has not taken her blood pressure medication yet today.  She is currently on medications for urinary tract infection and has 1 dose left.  No fever, headache, shortness of breath, vomiting, abdominal pain.   Past Medical History:  Diagnosis Date  . Diabetes mellitus without complication (HCC)   . Hypertension   . Stroke [facility](HCC)    2000    There are no active problems to display for this patient.   Past Surgical History:  Procedure Laterality Date  . ABDOMINAL HYSTERECTOMY      Prior to Admission medications   Medication Sig Start Date End Date Taking? Authorizing Provider  albuterol (PROVENTIL HFA;VENTOLIN HFA) 108 (90 BASE) MCG/ACT inhaler Inhale 2 puffs into the lungs every 6 (six) hours as needed for wheezing or shortness of breath. 07/28/14   Menshew, Charlesetta IvoryJenise V Bacon, PA-C  atorvastatin (LIPITOR) 20 MG tablet Take 20 mg by mouth daily.    [provider]  benzonatate (TESSALON PERLES) 100 MG capsule Take 1 capsule (100 mg total) by mouth 3 (three) times daily as needed for cough (Take 1-2 per dose). 07/28/14   Menshew, Charlesetta IvoryJenise V Bacon, PA-C  cephALEXin (KEFLEX) 500 MG capsule Take 1 capsule (500 mg total) by mouth 2 (two) times daily. 12/17/17   Jene EveryKinner, Robert, MD  clopidogrel (PLAVIX) 75 MG tablet Take 75 mg by mouth daily.    [provider]  glipiZIDE (GLUCOTROL) 5 MG tablet Take by mouth daily before breakfast.    [provider]  hydrochlorothiazide (HYDRODIURIL) 25 MG tablet Take 25 mg by  mouth daily.    [provider]  metFORMIN (GLUCOPHAGE) 500 MG tablet Take by mouth 2 (two) times daily with a meal.    [provider]  metoprolol succinate (TOPROL-XL) 25 MG 24 hr tablet Take 25 mg by mouth daily.    [provider]  oseltamivir (TAMIFLU) 30 MG capsule Take 1 capsule (30 mg total) by mouth daily for 4 days. 03/03/18 03/07/18  Enid DerryWagner, Dainel Arcidiacono, PA-C    Allergies Patient has no known allergies.  No family history on file.  Social History Social History   Tobacco Use  . Smoking status: Never Smoker  . Smokeless tobacco: Never Used  Substance Use Topics  . Alcohol use: No  . Drug use: No     Review of Systems  Constitutional: No fever/chills ENT: No upper respiratory complaints. Cardiovascular: No chest pain. Respiratory: Positive for cough. No SOB. Gastrointestinal: No abdominal pain.  No vomiting.  Genitourinary: Negative for dysuria. Musculoskeletal: Negative for musculoskeletal pain. Skin: Negative for rash, abrasions, lacerations, ecchymosis. Neurological: Negative for headaches   ____________________________________________   PHYSICAL EXAM:  VITAL SIGNS: ED Triage Vitals  Enc Vitals Group     BP 03/02/18 1252 (!) 177/54     Pulse Rate 03/02/18 1252 (!) 52     Resp 03/02/18 1252 18     Temp 03/02/18 1252 98.3 F (36.8 C)     Temp Source 03/02/18 1252 Oral     SpO2 03/02/18 1252 98 %  Weight 03/02/18 1253 140 lb (63.5 kg)     Height 03/02/18 1253 5\' 2"  (1.575 m)     Head Circumference --      Peak Flow --      Pain Score 03/02/18 1252 0     Pain Loc --      Pain Edu? --      Excl. in GC? --      Constitutional: Alert and oriented. Well appearing and in no acute distress. Eyes: Conjunctivae are normal. PERRL. EOMI. Head: Atraumatic. ENT:      Ears:      Nose: No congestion/rhinnorhea.      Mouth/Throat: Mucous membranes are moist.  Neck: No stridor.  Cardiovascular: Normal rate, regular rhythm.  Good  peripheral circulation. Respiratory: Normal respiratory effort without tachypnea or retractions. Lungs CTAB. Good air entry to the bases with no decreased or absent breath sounds. Gastrointestinal: Bowel sounds 4 quadrants. Soft and nontender to palpation. No guarding or rigidity. No palpable masses. No distention.  Musculoskeletal: Full range of motion to all extremities. No gross deformities appreciated. Neurologic:  Normal speech and language. No gross focal neurologic deficits are appreciated.  Skin:  Skin is warm, dry and intact. No rash noted. Psychiatric: Mood and affect are normal. Speech and behavior are normal. Patient exhibits appropriate insight and judgement.   ____________________________________________   LABS (all labs ordered are listed, but only abnormal results are displayed)  Labs Reviewed  CBC - Abnormal; Notable for the following components:      Result Value   WBC 3.1 (*)    HCT 35.7 (*)    Platelets 149 (*)    All other components within normal limits  COMPREHENSIVE METABOLIC PANEL - Abnormal; Notable for the following components:   Sodium 129 (*)    Chloride 96 (*)    Glucose, Bld 237 (*)    BUN 46 (*)    Creatinine, Ser 1.55 (*)    GFR calc non Af Amer 32 (*)    GFR calc Af Amer 37 (*)    All other components within normal limits  INFLUENZA PANEL BY PCR (TYPE A & B) - Abnormal; Notable for the following components:   Influenza B By PCR POSITIVE (*)    All other components within normal limits  URINALYSIS, COMPLETE (UACMP) WITH MICROSCOPIC - Abnormal; Notable for the following components:   Color, Urine YELLOW (*)    APPearance HAZY (*)    All other components within normal limits  TROPONIN I  BRAIN NATRIURETIC PEPTIDE   ____________________________________________  EKG   ____________________________________________  RADIOLOGY Lexine BatonI, Gina Kelly, personally viewed and evaluated these images (plain radiographs) as part of my medical decision  making, as well as reviewing the written report by the radiologist.  Dg Chest 2 View  Result Date: 03/02/2018 CLINICAL DATA:  Productive cough and shortness of breath for the past 4 days. EXAM: CHEST - 2 VIEW COMPARISON:  03/08/2015. FINDINGS: Interval borderline enlargement of the cardiac silhouette. Clear lungs with normal vascularity. The lungs remain hyperexpanded with mildly prominent interstitial markings. Thoracic spine degenerative changes. Diffuse osteopenia. Atheromatous aortic calcifications. IMPRESSION: 1. No acute abnormality. 2. Interval borderline cardiomegaly. 3. Stable changes of COPD. Electronically Signed   By: Beckie SaltsSteven  Reid M.D.   On: 03/02/2018 13:43    ____________________________________________    PROCEDURES  Procedure(s) performed:    Procedures    Medications  sodium chloride 0.9 % bolus 500 mL (0 mLs Intravenous Stopped 03/02/18 1621)  oseltamivir (TAMIFLU) capsule  30 mg (30 mg Oral Given 03/02/18 1537)     ____________________________________________   INITIAL IMPRESSION / ASSESSMENT AND PLAN / ED COURSE  Pertinent labs & imaging results that were available during my care of the patient were reviewed by me and considered in my medical decision making (see chart for details).  Review of the [place] CSRS was performed in accordance of the [facility] prior to dispensing any controlled drugs.     Patient's diagnosis is consistent with influenza.  Vital signs and exam are reassuring.  Influenza B test is positive.  No infiltrate on chest x-ray.  Blood work is consistent with previous.  EKG shows sinus bradycardia.  Patient's blood pressure is elevated but patient had not yet taken her blood pressure.  No infection on urinalysis.  Case was discussed with Dr. Lenard Lance, who recommends fluids and agrees with plan of discharge home.  Dose of Tamiflu was given.  Patient will be discharged home with prescriptions for Tamiflu. Patient is to follow up with primary care as  directed. Patient is given ED precautions to return to the ED for any worsening or new symptoms.     ____________________________________________  FINAL CLINICAL IMPRESSION(S) / ED DIAGNOSES  Final diagnoses:  Influenza B      NEW MEDICATIONS STARTED DURING THIS VISIT:  ED Discharge Orders         Ordered    oseltamivir (TAMIFLU) 30 MG capsule  Daily     03/02/18 1615              This chart was dictated using voice recognition software/Dragon. Despite best efforts to proofread, errors can occur which can change the meaning. Any change was purely unintentional.    Enid Derry, PA-C 03/02/18 1846    Minna Antis, MD 03/02/18 2025

## 2018-03-02 NOTE — ED Triage Notes (Signed)
Flu like symptoms x3 or 4 days , body aches ,

## 2019-05-28 IMAGING — CT CT HEAD W/O CM
3 series · 15 of 45 positions shown, 18 images · non-contrast
Comparison: Head CT 05/03/2016.

CLINICAL DATA: 77-year-old female with history of dizziness for the
past 2-3 days.

EXAM:
CT HEAD WITHOUT CONTRAST
TECHNIQUE: Contiguous axial images were obtained from the base of the skull
through the vertex without intravenous contrast.

[Series 2: head wo · axial · 0.43mm/px · z∈[-107,+8]mm · 9 of 28 slices shown, 12 images]
[im 3/28  brain]
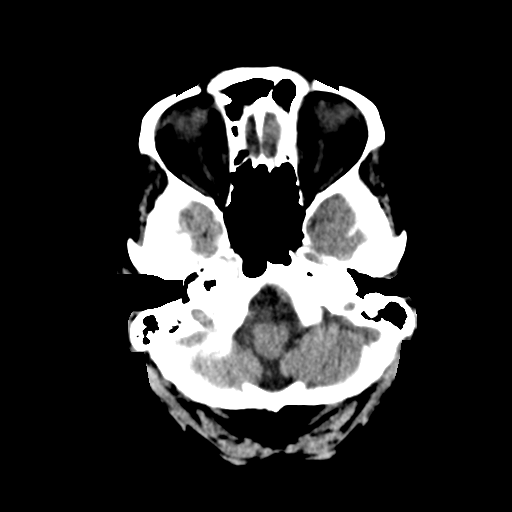
[im 3/28  bone]
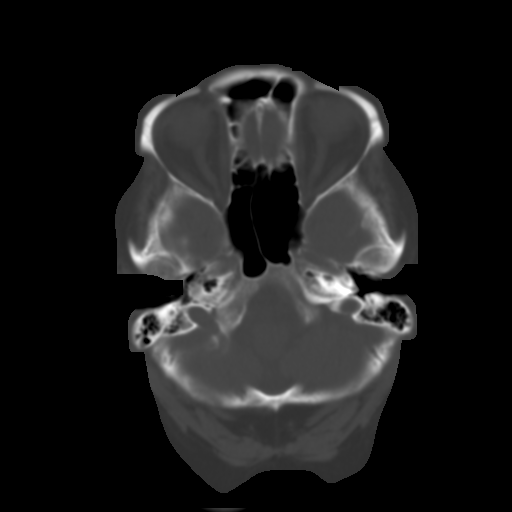
[im 6/28  brain]
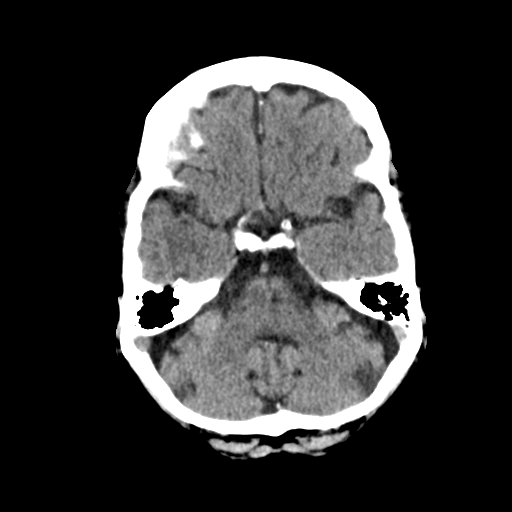
[im 9/28  brain]
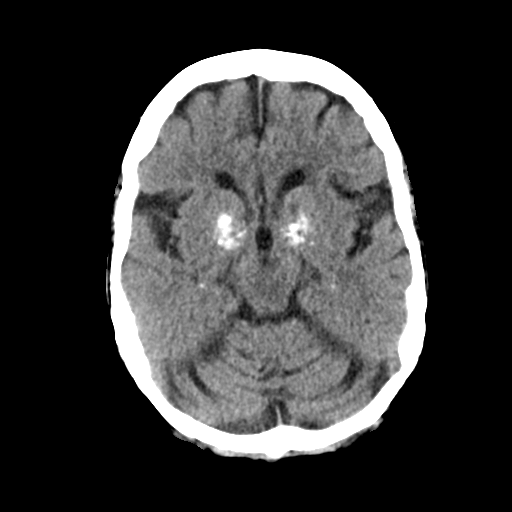
[im 12/28  brain]
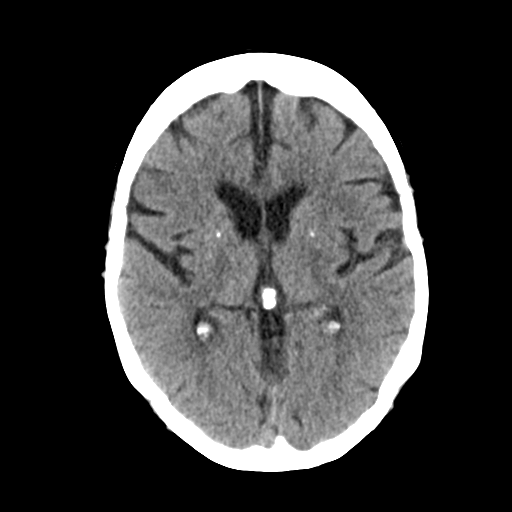
[im 15/28  brain]
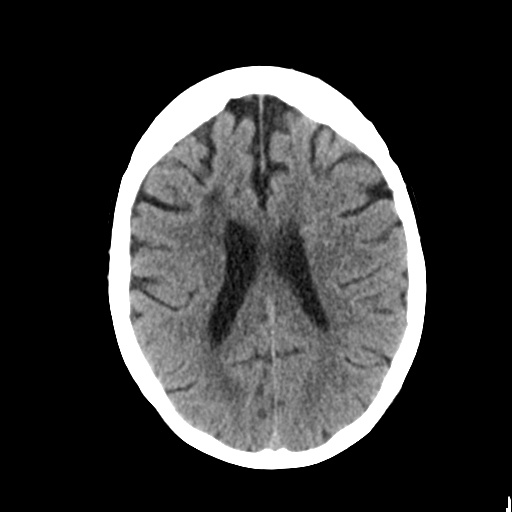
[im 15/28  bone]
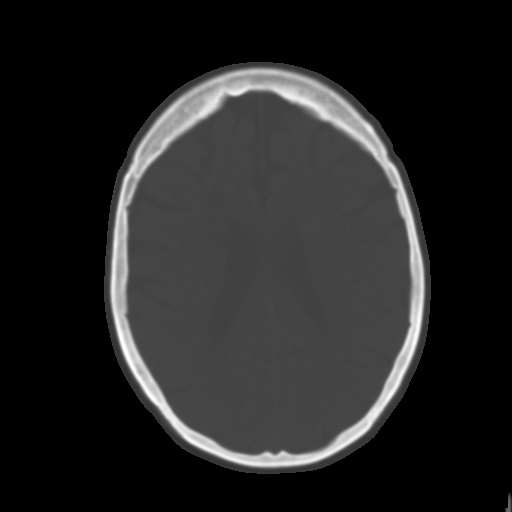
[im 17/28  brain]
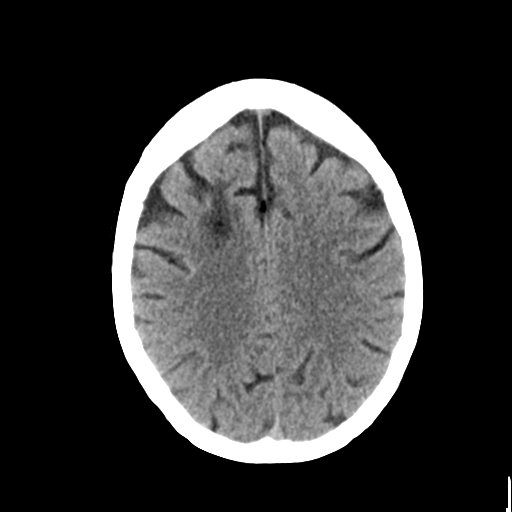
[im 20/28  brain]
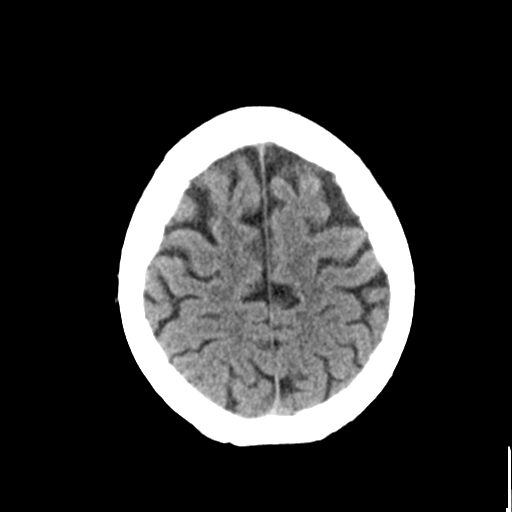
[im 23/28  brain]
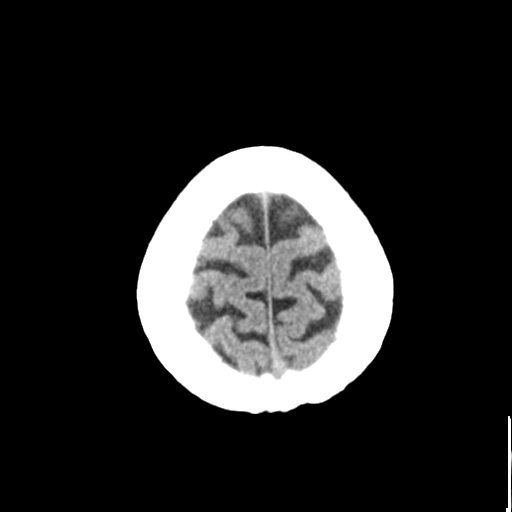
[im 26/28  brain]
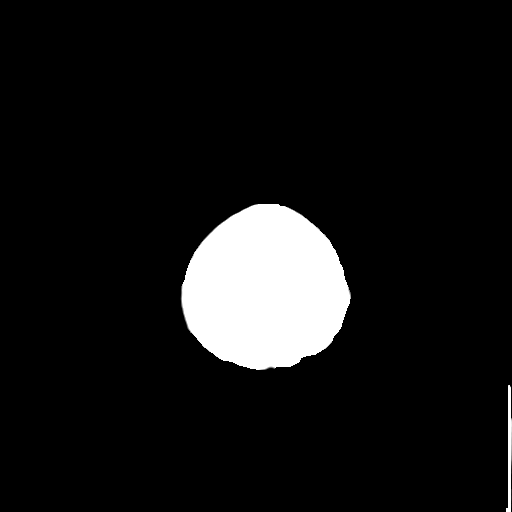
[im 26/28  bone]
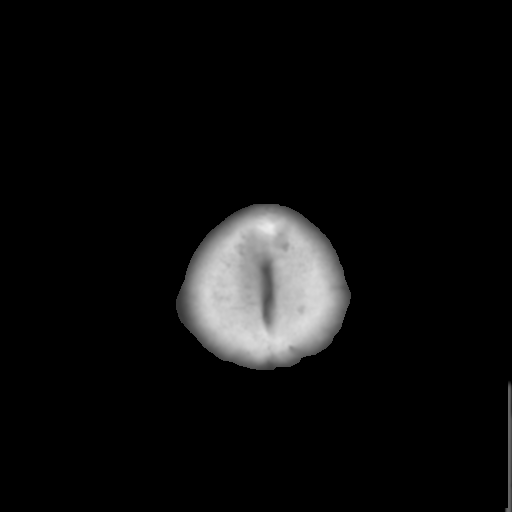

[Series 4: coronal soft tissue · coronal · 0.28mm/px · 3 of 61 slices shown]
[im 21/61  brain]
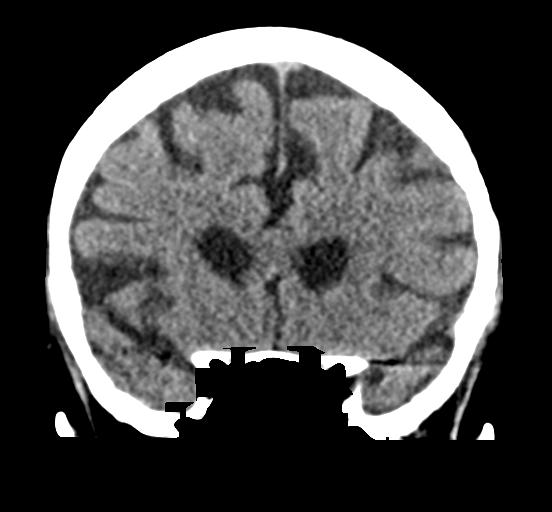
[im 27/61  brain]
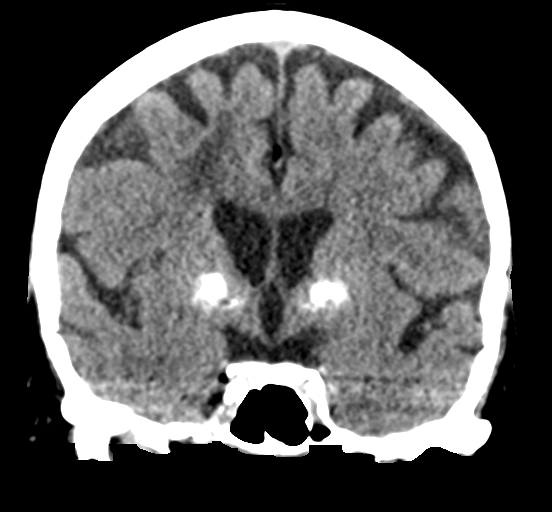
[im 34/61  brain]
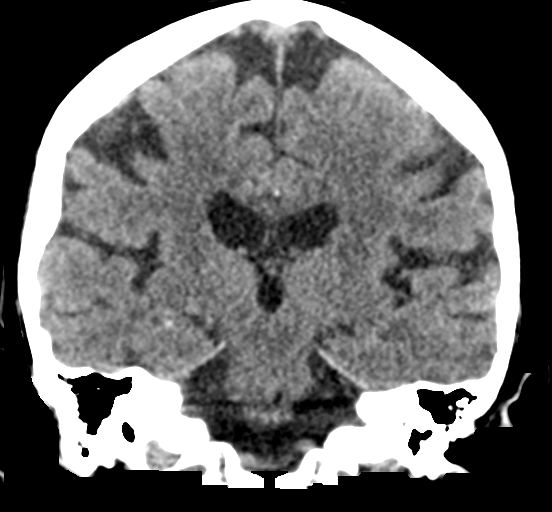

[Series 5: sagittal soft tissue · sagittal · 0.28mm/px · 3 of 52 slices shown]
[im 18/52  brain]
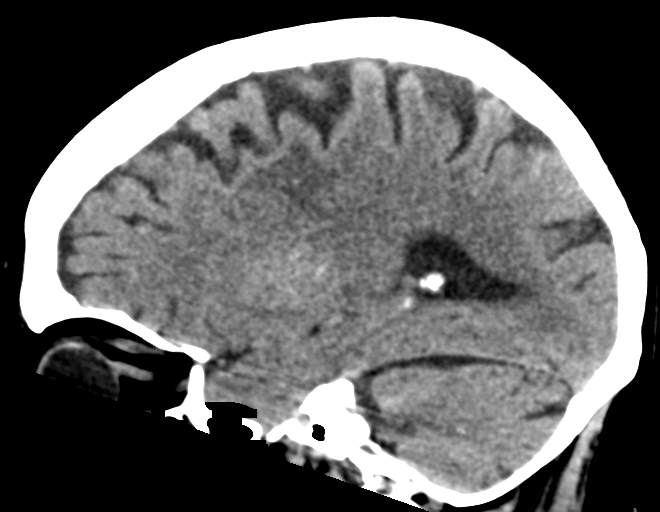
[im 26/52  brain]
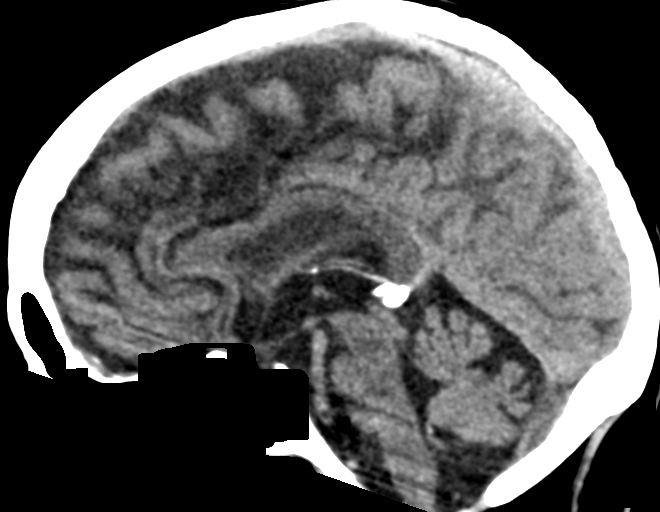
[im 35/52  brain]
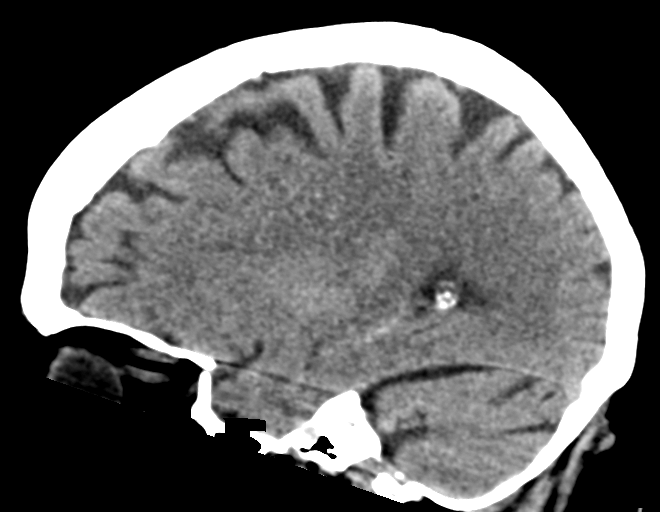

[15 of 45 positions shown; findings below may reference images not displayed]

FINDINGS: Brain: Mild cerebral and cerebellar atrophy. Physiologic
calcifications in the basal ganglia bilaterally. Small right frontal
cortical calcification also noted, unchanged. Patchy and confluent
areas of decreased attenuation are noted throughout the deep and
periventricular white matter of the cerebral hemispheres
bilaterally, compatible with chronic microvascular ischemic disease.
More focal area of low attenuation in the right frontal lobe white
matter, compatible with encephalomalacia from remote infarct. Small
lacunar infarcts in the basal ganglia bilaterally, similar to the
prior examination. No evidence of acute infarction, hemorrhage,
hydrocephalus, extra-axial collection or mass lesion/mass effect.

Vascular: No hyperdense vessel or unexpected calcification.

Skull: Normal. Negative for fracture or focal lesion.

Sinuses/Orbits: No acute finding.

Other: None.
IMPRESSION: 1. No acute intracranial abnormalities.
2. Mild cerebral and cerebellar atrophy with extensive chronic
microvascular ischemic changes, old bilateral lacunar infarcts, and
old right frontal lobe infarct, similar to the prior study, as
above.
3. Severe physiologic calcifications in the basal ganglia, similar
to the prior examination.

## 2019-09-12 ENCOUNTER — Emergency Department
Admission: EM | Admit: 2019-09-12 | Discharge: 2019-09-13 | Disposition: A | Discharge status: Home / Self Care | Payer: Medicare Other | Attending: Emergency Medicine | Admitting: Emergency Medicine

## 2019-09-12 ENCOUNTER — Other Ambulatory Visit: Payer: Self-pay

## 2019-09-12 ENCOUNTER — Emergency Department: Payer: Medicare Other

## 2019-09-12 DIAGNOSIS — Z5321 Procedure and treatment not carried out due to patient leaving prior to being seen by health care provider: Secondary | ICD-10-CM | POA: Insufficient documentation

## 2019-09-12 DIAGNOSIS — R0602 Shortness of breath: Secondary | ICD-10-CM | POA: Insufficient documentation

## 2019-09-12 DIAGNOSIS — R05 Cough: Secondary | ICD-10-CM | POA: Insufficient documentation

## 2019-09-12 DIAGNOSIS — R0981 Nasal congestion: Secondary | ICD-10-CM | POA: Diagnosis not present

## 2019-09-12 DIAGNOSIS — R1110 Vomiting, unspecified: Secondary | ICD-10-CM | POA: Diagnosis not present

## 2019-09-12 LAB — BASIC METABOLIC PANEL
Anion gap: 9 (ref 5–15)
BUN: 43 mg/dL — ABNORMAL HIGH (ref 8–23)
CO2: 21 mmol/L — ABNORMAL LOW (ref 22–32)
Calcium: 9 mg/dL (ref 8.9–10.3)
Chloride: 106 mmol/L (ref 98–111)
Creatinine, Ser: 1.51 mg/dL — ABNORMAL HIGH (ref 0.44–1.00)
GFR calc Af Amer: 38 mL/min — ABNORMAL LOW (ref >60)
GFR calc non Af Amer: 33 mL/min — ABNORMAL LOW (ref >60)
Glucose, Bld: 172 mg/dL — ABNORMAL HIGH (ref 70–99)
Potassium: 4.5 mmol/L (ref 3.5–5.1)
Sodium: 136 mmol/L (ref 135–145)

## 2019-09-12 LAB — CBC
HCT: 32.9 % — ABNORMAL LOW (ref 36.0–46.0)
Hemoglobin: 11.2 g/dL — ABNORMAL LOW (ref 12.0–15.0)
MCH: 28.1 pg (ref 26.0–34.0)
MCHC: 34 g/dL (ref 30.0–36.0)
MCV: 82.7 fL (ref 80.0–100.0)
Platelets: 221 10*3/uL (ref 150–400)
RBC: 3.98 MIL/uL (ref 3.87–5.11)
RDW: 13.8 % (ref 11.5–15.5)
WBC: 8.6 10*3/uL (ref 4.0–10.5)
nRBC: 0 % (ref 0.0–0.2)

## 2019-09-12 NOTE — ED Triage Notes (Signed)
Pt comes via POV from home with c/o SOb that started yesterday. Pt states cough, vomiting and runny nose.  Pt denies any CP

## 2019-09-13 NOTE — ED Notes (Signed)
No answer when called several times from lobby 

## 2021-02-20 IMAGING — CR DG CHEST 2V
1 series · 2 of 2 positions shown · non-contrast
Comparison: 03/02/2018

CLINICAL DATA: Short of breath, cough, rhinorrhea

EXAM:
CHEST - 2 VIEW

[Series 1: dg chest 2 view · 0.14mm/px · 2 of 2 slices shown]
[im 1/2]
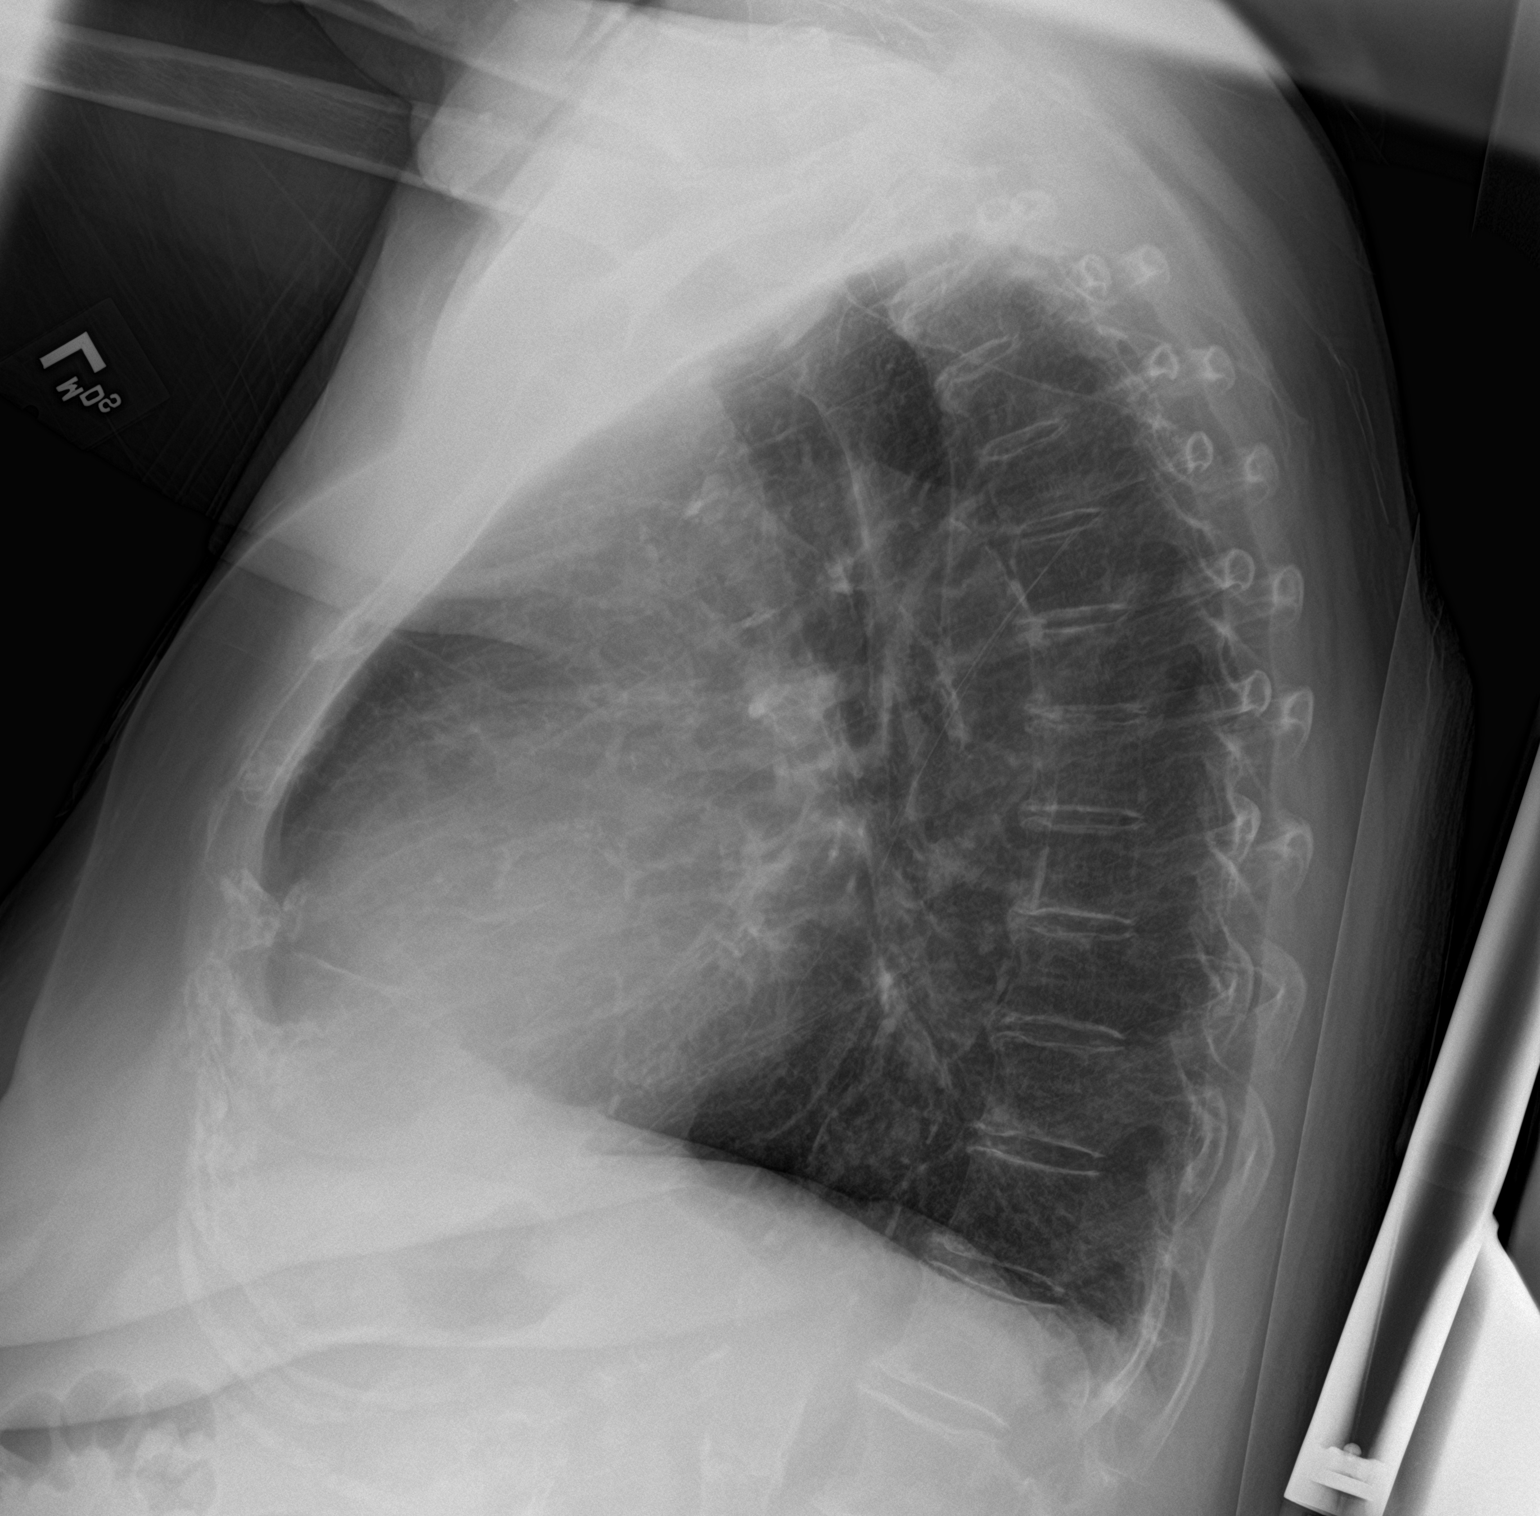
[im 2/2]
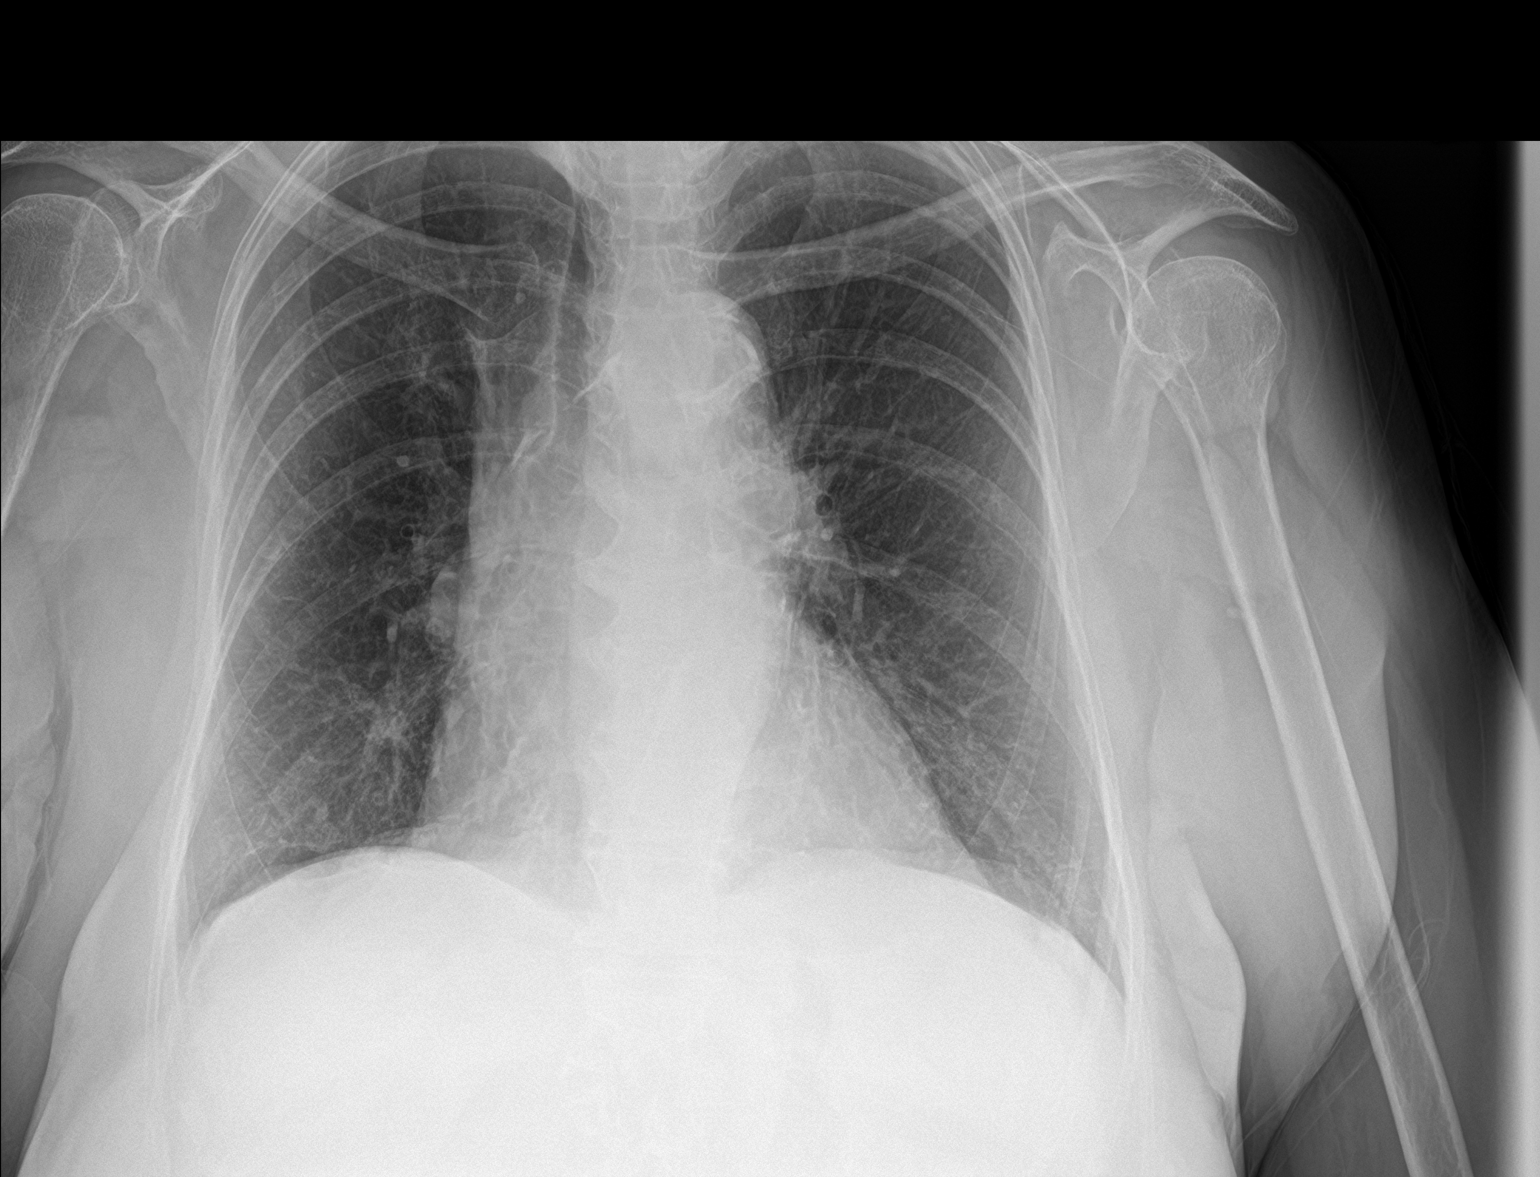

[2 of 2 positions shown; findings below may reference images not displayed]

FINDINGS: Frontal and lateral views of the chest demonstrate a stable cardiac
silhouette. No airspace disease, effusion, or pneumothorax. The
lungs are hyperinflated. Diffuse atherosclerosis of the aorta
unchanged.
IMPRESSION: 1. Emphysema.
2. No acute airspace disease.

## 2022-01-14 ENCOUNTER — Ambulatory Visit
Admission: EM | Admit: 2022-01-14 | Discharge: 2022-01-14 | Disposition: A | Discharge status: Home / Self Care | Payer: Medicare Other | Attending: Urgent Care | Admitting: Urgent Care

## 2022-01-14 DIAGNOSIS — R300 Dysuria: Secondary | ICD-10-CM | POA: Diagnosis present

## 2022-01-14 DIAGNOSIS — N3001 Acute cystitis with hematuria: Secondary | ICD-10-CM | POA: Diagnosis present

## 2022-01-14 LAB — POCT URINALYSIS DIP (MANUAL ENTRY)
Bilirubin, UA: NEGATIVE
Glucose, UA: NEGATIVE mg/dL
Ketones, POC UA: NEGATIVE mg/dL
Nitrite, UA: NEGATIVE
Protein Ur, POC: 100 mg/dL — AB
Spec Grav, UA: >=1.03 — AB (ref 1.010–1.025)
Urobilinogen, UA: 0.2 E.U./dL
pH, UA: 5 (ref 5.0–8.0)

## 2022-01-14 MED ORDER — SULFAMETHOXAZOLE-TRIMETHOPRIM 800-160 MG PO TABS: 1.0000 | ORAL_TABLET | Freq: Two times a day (BID) | ORAL | 0 refills | Status: AC

## 2022-01-14 NOTE — ED Provider Notes (Addendum)
Gina Kelly    CSN: [id number] Arrival date & time: 01/14/22  1644      History   Chief Complaint Chief Complaint  Patient presents with   Urinary Frequency    Possible uti - Entered by patient    HPI Gina Kelly is a 81 y.o. female.    Urinary Frequency    Accompanied by her daughter.  She presents with UTI symptoms x 4 days.  She endorses foul-smelling urine and white-colored.  She denies fever, denies abdominal pain, denies back pain.  Past Medical History:  Diagnosis Date   Diabetes mellitus without complication (HCC)    Hypertension    Stroke (HCC)    2000    There are no problems to display for this patient.   Past Surgical History:  Procedure Laterality Date   ABDOMINAL HYSTERECTOMY      OB History   No obstetric history on file.      Home Medications    Prior to Admission medications   Medication Sig Start Date End Date Taking? Authorizing Provider  albuterol (PROVENTIL HFA;VENTOLIN HFA) 108 (90 BASE) MCG/ACT inhaler Inhale 2 puffs into the lungs every 6 (six) hours as needed for wheezing or shortness of breath. 07/28/14   Menshew, Charlesetta Ivory, PA-C  atorvastatin (LIPITOR) 20 MG tablet Take 20 mg by mouth daily.    [provider]  benzonatate (TESSALON PERLES) 100 MG capsule Take 1 capsule (100 mg total) by mouth 3 (three) times daily as needed for cough (Take 1-2 per dose). 07/28/14   Menshew, Charlesetta Ivory, PA-C  cephALEXin (KEFLEX) 500 MG capsule Take 1 capsule (500 mg total) by mouth 2 (two) times daily. 12/17/17   Jene Every, MD  clopidogrel (PLAVIX) 75 MG tablet Take 75 mg by mouth daily.    [provider]  glipiZIDE (GLUCOTROL) 5 MG tablet Take by mouth daily before breakfast.    [provider]  hydrochlorothiazide (HYDRODIURIL) 25 MG tablet Take 25 mg by mouth daily.    [provider]  metFORMIN (GLUCOPHAGE) 500 MG tablet Take by mouth 2 (two) times daily with a meal.    [provider]  metoprolol succinate (TOPROL-XL) 25 MG 24 hr tablet Take 25 mg by mouth daily.    [provider]    Family History No family history on file.  Social History Social History   Tobacco Use   Smoking status: Never   Smokeless tobacco: Never  Substance Use Topics   Alcohol use: No   Drug use: No     Allergies   Penicillins   Review of Systems Review of Systems  Genitourinary:  Positive for frequency.     Physical Exam Triage Vital Signs ED Triage Vitals  Enc Vitals Group     BP 01/14/22 1739 117/68     Pulse Rate 01/14/22 1739 (!) 56     Resp 01/14/22 1739 15     Temp 01/14/22 1739 97.6 F (36.4 C)     Temp src --      SpO2 01/14/22 1739 96 %     Weight --      Height --      Head Circumference --      Peak Flow --      Pain Score 01/14/22 1740 0     Pain Loc --      Pain Edu? --      Excl. in GC? --    No data found.  Updated Vital Signs BP 117/68   Pulse (!) 56   Temp 97.6 F (36.4 C)   Resp 15   SpO2 96%   Visual Acuity Right Eye Distance:   Left Eye Distance:   Bilateral Distance:    Right Eye Near:   Left Eye Near:    Bilateral Near:     Physical Exam Vitals reviewed.  Constitutional:      Appearance: Normal appearance.  Neurological:     General: No focal deficit present.     Mental Status: She is alert and oriented to person, place, and time.  Psychiatric:        Mood and Affect: Mood normal.        Behavior: Behavior normal.      UC Treatments / Results  Labs (all labs ordered are listed, but only abnormal results are displayed) Labs Reviewed  POCT URINALYSIS DIP (MANUAL ENTRY) - Abnormal; Notable for the following components:      Result Value   Color, UA other (*)    Clarity, UA turbid (*)    Spec Grav, UA >=1.030 (*)    Blood, UA trace-lysed (*)    Protein Ur, POC =100 (*)    Leukocytes, UA Large (3+) (*)    All other components within normal limits  URINE CULTURE     EKG   Radiology No results found.  Procedures Procedures (including critical care time)  Medications Ordered in UC Medications - No data to display  Initial Impression / Assessment and Plan / UC Course  I have reviewed the triage vital signs and the nursing notes.  Pertinent labs & imaging results that were available during my care of the patient were reviewed by me and considered in my medical decision making (see chart for details).   UA is positive with large leukocytes and trace blood.  Urine is turbid, white/tan in color.  Will treat with Bactrim.  Sending for culture.  Patient has CKD stage IV.  Last metabolic panel with GFR of 34.  Will recheck BMP to verify no worsening kidney function and tolerance of Bactrim.  Final Clinical Impressions(s) / UC Diagnoses   Final diagnoses:  Dysuria   Discharge Instructions   None    ED Prescriptions   None    PDMP not reviewed this encounter.   Charma Igo, FNP 01/14/22 1751    Charma Igo, FNP 01/14/22 1757    Charma Igo, FNP 01/14/22 1758

## 2022-01-14 NOTE — ED Triage Notes (Signed)
Pt. Presents to UC w/ c/o foul-smelling and "white" colored urine as stated by the patients daughter for the past 4 days.

## 2022-01-14 NOTE — Discharge Instructions (Addendum)
Follow up here or with your primary care provider if your symptoms are worsening or not improving with treatment.     

## 2022-01-15 LAB — BASIC METABOLIC PANEL
BUN/Creatinine Ratio: 29 — ABNORMAL HIGH (ref 12–28)
BUN: 72 mg/dL — ABNORMAL HIGH (ref 8–27)
CO2: 15 mmol/L — ABNORMAL LOW (ref 20–29)
Calcium: 8.6 mg/dL — ABNORMAL LOW (ref 8.7–10.3)
Chloride: 102 mmol/L (ref 96–106)
Creatinine, Ser: 2.47 mg/dL — ABNORMAL HIGH (ref 0.57–1.00)
Glucose: 185 mg/dL — ABNORMAL HIGH (ref 70–99)
Potassium: 5.2 mmol/L (ref 3.5–5.2)
Sodium: 134 mmol/L (ref 134–144)
eGFR: 19 mL/min/{1.73_m2} — ABNORMAL LOW (ref >59)

## 2022-01-17 LAB — URINE CULTURE: Culture: >=100000 — AB

## 2022-01-19 ENCOUNTER — Telehealth: Payer: Self-pay | Admitting: Urgent Care

## 2022-01-19 NOTE — Telephone Encounter (Signed)
Attempted to contact patient to f/u on OV last week. She was prescribed Bactrim for presumed UTI with ecoli. She had recent GFR of 34 which did not require reduction in Bactrim dosage. Repeat BMP was performed to verify GFR which indicated = 19.  Pt has no VM set up.

## 2022-01-19 NOTE — Telephone Encounter (Signed)
Received call-back from patient's daughter who told me that her mom believes the antibiotic is helping but still feeling "off" though she could not provide any additional details.  I asked her to stop taking Bactrim based upon her updated GFR. I would attempt to send an alternative. She is allergic to [facility] per chart. Unfortunately no other outpatient medications are appropriate based upon susceptibility report.  Will ask her to f/up with her PCP or CKD provider.

## 2022-01-21 ENCOUNTER — Encounter: Payer: Self-pay | Admitting: Emergency Medicine

## 2022-01-21 ENCOUNTER — Other Ambulatory Visit: Payer: Self-pay

## 2022-01-21 DIAGNOSIS — E8770 Fluid overload, unspecified: Secondary | ICD-10-CM | POA: Diagnosis not present

## 2022-01-21 DIAGNOSIS — E7800 Pure hypercholesterolemia, unspecified: Secondary | ICD-10-CM | POA: Diagnosis present

## 2022-01-21 DIAGNOSIS — R54 Age-related physical debility: Secondary | ICD-10-CM | POA: Diagnosis present

## 2022-01-21 DIAGNOSIS — R296 Repeated falls: Secondary | ICD-10-CM | POA: Diagnosis present

## 2022-01-21 DIAGNOSIS — Z888 Allergy status to other drugs, medicaments and biological substances status: Secondary | ICD-10-CM

## 2022-01-21 DIAGNOSIS — Z823 Family history of stroke: Secondary | ICD-10-CM

## 2022-01-21 DIAGNOSIS — Z833 Family history of diabetes mellitus: Secondary | ICD-10-CM

## 2022-01-21 DIAGNOSIS — Z79899 Other long term (current) drug therapy: Secondary | ICD-10-CM

## 2022-01-21 DIAGNOSIS — J9601 Acute respiratory failure with hypoxia: Secondary | ICD-10-CM | POA: Diagnosis not present

## 2022-01-21 DIAGNOSIS — Z7984 Long term (current) use of oral hypoglycemic drugs: Secondary | ICD-10-CM

## 2022-01-21 DIAGNOSIS — Z8673 Personal history of transient ischemic attack (TIA), and cerebral infarction without residual deficits: Secondary | ICD-10-CM

## 2022-01-21 DIAGNOSIS — I129 Hypertensive chronic kidney disease with stage 1 through stage 4 chronic kidney disease, or unspecified chronic kidney disease: Secondary | ICD-10-CM | POA: Diagnosis present

## 2022-01-21 DIAGNOSIS — J810 Acute pulmonary edema: Secondary | ICD-10-CM | POA: Diagnosis not present

## 2022-01-21 DIAGNOSIS — D631 Anemia in chronic kidney disease: Secondary | ICD-10-CM | POA: Diagnosis present

## 2022-01-21 DIAGNOSIS — Z87440 Personal history of urinary (tract) infections: Secondary | ICD-10-CM

## 2022-01-21 DIAGNOSIS — N179 Acute kidney failure, unspecified: Secondary | ICD-10-CM | POA: Diagnosis not present

## 2022-01-21 DIAGNOSIS — E1122 Type 2 diabetes mellitus with diabetic chronic kidney disease: Secondary | ICD-10-CM | POA: Diagnosis present

## 2022-01-21 DIAGNOSIS — E872 Acidosis, unspecified: Secondary | ICD-10-CM | POA: Diagnosis not present

## 2022-01-21 DIAGNOSIS — E875 Hyperkalemia: Secondary | ICD-10-CM | POA: Diagnosis present

## 2022-01-21 DIAGNOSIS — Z8249 Family history of ischemic heart disease and other diseases of the circulatory system: Secondary | ICD-10-CM

## 2022-01-21 DIAGNOSIS — Z7902 Long term (current) use of antithrombotics/antiplatelets: Secondary | ICD-10-CM

## 2022-01-21 DIAGNOSIS — Z1152 Encounter for screening for COVID-19: Secondary | ICD-10-CM

## 2022-01-21 DIAGNOSIS — N1832 Chronic kidney disease, stage 3b: Secondary | ICD-10-CM | POA: Diagnosis present

## 2022-01-21 DIAGNOSIS — E861 Hypovolemia: Secondary | ICD-10-CM | POA: Diagnosis present

## 2022-01-21 DIAGNOSIS — E8779 Other fluid overload: Secondary | ICD-10-CM | POA: Diagnosis not present

## 2022-01-21 DIAGNOSIS — Z880 Allergy status to penicillin: Secondary | ICD-10-CM

## 2022-01-21 DIAGNOSIS — K529 Noninfective gastroenteritis and colitis, unspecified: Secondary | ICD-10-CM | POA: Diagnosis present

## 2022-01-21 DIAGNOSIS — R001 Bradycardia, unspecified: Secondary | ICD-10-CM | POA: Diagnosis present

## 2022-01-21 LAB — CBC
HCT: 31.3 % — ABNORMAL LOW (ref 36.0–46.0)
Hemoglobin: 10.4 g/dL — ABNORMAL LOW (ref 12.0–15.0)
MCH: 28.3 pg (ref 26.0–34.0)
MCHC: 33.2 g/dL (ref 30.0–36.0)
MCV: 85.1 fL (ref 80.0–100.0)
Platelets: 177 10*3/uL (ref 150–400)
RBC: 3.68 MIL/uL — ABNORMAL LOW (ref 3.87–5.11)
RDW: 15.3 % (ref 11.5–15.5)
WBC: 7.1 10*3/uL (ref 4.0–10.5)
nRBC: 0 % (ref 0.0–0.2)

## 2022-01-21 LAB — BASIC METABOLIC PANEL
Anion gap: 9 (ref 5–15)
BUN: 106 mg/dL — ABNORMAL HIGH (ref 8–23)
CO2: 16 mmol/L — ABNORMAL LOW (ref 22–32)
Calcium: 8.3 mg/dL — ABNORMAL LOW (ref 8.9–10.3)
Chloride: 108 mmol/L (ref 98–111)
Creatinine, Ser: 3.61 mg/dL — ABNORMAL HIGH (ref 0.44–1.00)
GFR, Estimated: 12 mL/min — ABNORMAL LOW (ref >60)
Glucose, Bld: 252 mg/dL — ABNORMAL HIGH (ref 70–99)
Potassium: 5.8 mmol/L — ABNORMAL HIGH (ref 3.5–5.1)
Sodium: 133 mmol/L — ABNORMAL LOW (ref 135–145)

## 2022-01-21 MED ORDER — SODIUM CHLORIDE 0.9 % IV BOLUS
500.0000 mL | Freq: Once | INTRAVENOUS | Status: AC
  Administered 2022-01-21: 500 mL via INTRAVENOUS

## 2022-01-21 MED ORDER — SODIUM CHLORIDE 0.9 % IV BOLUS
500.0000 mL | Freq: Once | INTRAVENOUS | Status: AC
  Administered 2022-01-22: 500 mL via INTRAVENOUS

## 2022-01-21 NOTE — ED Triage Notes (Signed)
Patient into triage with daughter stating she took her mother to UC on 22nd and diagnosed with UTI and started on antibiotics and completed two days ago. Patient was then taking to PCP yesterday for dizziness x 3 days. Daughter concerned patient is dehydrated. Daughter adds patient has had diarrhea for past few days.

## 2022-01-21 NOTE — ED Provider Triage Note (Signed)
Emergency Medicine Provider Triage Evaluation Note  Gina Kelly , a 81 y.o. female  was evaluated in triage.  Pt complains of weakness, cough, diarrhea, recent uti placed on bactrim for 3 days.  Review of Systems  Positive:  Negative:   Physical Exam  BP (!) 125/49 (BP Location: Left Arm)   Pulse (!) 54   Temp 97.8 F (36.6 C) (Oral)   Resp 16   Ht 5\' 2"  (1.575 m)   Wt 63.5 kg   SpO2 98%   BMI 25.61 kg/m  Gen:   Awake, no distress   Resp:  Normal effort  MSK:   Moves extremities without difficulty  Other:    Medical Decision Making  Medically screening exam initiated at 7:00 PM.  Appropriate orders placed.  Gina Kelly was informed that the remainder of the evaluation will be completed by another provider, this initial triage assessment does not replace that evaluation, and the importance of remaining in the ED until their evaluation is complete.  Concerns for dehydration   Gina Cory, PA-C 01/21/22 1901

## 2022-01-22 ENCOUNTER — Inpatient Hospital Stay: Payer: Medicare Other

## 2022-01-22 ENCOUNTER — Inpatient Hospital Stay
Admission: EM | Admit: 2022-01-22 | Discharge: 2022-01-26 | DRG: 682 | Disposition: A | Discharge status: Home Health | Payer: Medicare Other | Attending: Internal Medicine | Admitting: Internal Medicine

## 2022-01-22 ENCOUNTER — Emergency Department: Payer: Medicare Other

## 2022-01-22 DIAGNOSIS — R531 Weakness: Secondary | ICD-10-CM

## 2022-01-22 DIAGNOSIS — N189 Chronic kidney disease, unspecified: Secondary | ICD-10-CM | POA: Diagnosis not present

## 2022-01-22 DIAGNOSIS — Z7902 Long term (current) use of antithrombotics/antiplatelets: Secondary | ICD-10-CM | POA: Diagnosis not present

## 2022-01-22 DIAGNOSIS — D631 Anemia in chronic kidney disease: Secondary | ICD-10-CM | POA: Diagnosis present

## 2022-01-22 DIAGNOSIS — E872 Acidosis, unspecified: Secondary | ICD-10-CM | POA: Diagnosis not present

## 2022-01-22 DIAGNOSIS — Z79899 Other long term (current) drug therapy: Secondary | ICD-10-CM | POA: Diagnosis not present

## 2022-01-22 DIAGNOSIS — I129 Hypertensive chronic kidney disease with stage 1 through stage 4 chronic kidney disease, or unspecified chronic kidney disease: Secondary | ICD-10-CM | POA: Diagnosis present

## 2022-01-22 DIAGNOSIS — E8779 Other fluid overload: Secondary | ICD-10-CM | POA: Diagnosis not present

## 2022-01-22 DIAGNOSIS — E119 Type 2 diabetes mellitus without complications: Secondary | ICD-10-CM

## 2022-01-22 DIAGNOSIS — E861 Hypovolemia: Secondary | ICD-10-CM | POA: Diagnosis present

## 2022-01-22 DIAGNOSIS — E7800 Pure hypercholesterolemia, unspecified: Secondary | ICD-10-CM | POA: Diagnosis present

## 2022-01-22 DIAGNOSIS — N1832 Chronic kidney disease, stage 3b: Secondary | ICD-10-CM | POA: Diagnosis present

## 2022-01-22 DIAGNOSIS — E875 Hyperkalemia: Secondary | ICD-10-CM | POA: Diagnosis present

## 2022-01-22 DIAGNOSIS — E1122 Type 2 diabetes mellitus with diabetic chronic kidney disease: Secondary | ICD-10-CM

## 2022-01-22 DIAGNOSIS — E8770 Fluid overload, unspecified: Secondary | ICD-10-CM | POA: Diagnosis not present

## 2022-01-22 DIAGNOSIS — Z1152 Encounter for screening for COVID-19: Secondary | ICD-10-CM | POA: Diagnosis not present

## 2022-01-22 DIAGNOSIS — R296 Repeated falls: Secondary | ICD-10-CM | POA: Diagnosis present

## 2022-01-22 DIAGNOSIS — N179 Acute kidney failure, unspecified: Secondary | ICD-10-CM | POA: Diagnosis not present

## 2022-01-22 DIAGNOSIS — R54 Age-related physical debility: Secondary | ICD-10-CM | POA: Diagnosis present

## 2022-01-22 DIAGNOSIS — Z833 Family history of diabetes mellitus: Secondary | ICD-10-CM | POA: Diagnosis not present

## 2022-01-22 DIAGNOSIS — Z87440 Personal history of urinary (tract) infections: Secondary | ICD-10-CM | POA: Diagnosis not present

## 2022-01-22 DIAGNOSIS — Z823 Family history of stroke: Secondary | ICD-10-CM | POA: Diagnosis not present

## 2022-01-22 DIAGNOSIS — K529 Noninfective gastroenteritis and colitis, unspecified: Secondary | ICD-10-CM | POA: Diagnosis present

## 2022-01-22 DIAGNOSIS — I10 Essential (primary) hypertension: Secondary | ICD-10-CM | POA: Diagnosis not present

## 2022-01-22 DIAGNOSIS — Z8673 Personal history of transient ischemic attack (TIA), and cerebral infarction without residual deficits: Secondary | ICD-10-CM | POA: Diagnosis not present

## 2022-01-22 DIAGNOSIS — E785 Hyperlipidemia, unspecified: Secondary | ICD-10-CM | POA: Diagnosis not present

## 2022-01-22 DIAGNOSIS — J810 Acute pulmonary edema: Secondary | ICD-10-CM | POA: Diagnosis not present

## 2022-01-22 DIAGNOSIS — Z8249 Family history of ischemic heart disease and other diseases of the circulatory system: Secondary | ICD-10-CM | POA: Diagnosis not present

## 2022-01-22 DIAGNOSIS — J9601 Acute respiratory failure with hypoxia: Secondary | ICD-10-CM | POA: Diagnosis not present

## 2022-01-22 LAB — BASIC METABOLIC PANEL
Anion gap: 7 (ref 5–15)
BUN: 93 mg/dL — ABNORMAL HIGH (ref 8–23)
CO2: 13 mmol/L — ABNORMAL LOW (ref 22–32)
Calcium: 8.3 mg/dL — ABNORMAL LOW (ref 8.9–10.3)
Chloride: 114 mmol/L — ABNORMAL HIGH (ref 98–111)
Creatinine, Ser: 2.88 mg/dL — ABNORMAL HIGH (ref 0.44–1.00)
GFR, Estimated: 16 mL/min — ABNORMAL LOW (ref >60)
Glucose, Bld: 138 mg/dL — ABNORMAL HIGH (ref 70–99)
Potassium: 5.4 mmol/L — ABNORMAL HIGH (ref 3.5–5.1)
Sodium: 134 mmol/L — ABNORMAL LOW (ref 135–145)

## 2022-01-22 LAB — CBC
HCT: 31.3 % — ABNORMAL LOW (ref 36.0–46.0)
Hemoglobin: 10.4 g/dL — ABNORMAL LOW (ref 12.0–15.0)
MCH: 28.4 pg (ref 26.0–34.0)
MCHC: 33.2 g/dL (ref 30.0–36.0)
MCV: 85.5 fL (ref 80.0–100.0)
Platelets: 162 10*3/uL (ref 150–400)
RBC: 3.66 MIL/uL — ABNORMAL LOW (ref 3.87–5.11)
RDW: 15.5 % (ref 11.5–15.5)
WBC: 7.1 10*3/uL (ref 4.0–10.5)
nRBC: 0 % (ref 0.0–0.2)

## 2022-01-22 LAB — HEPATIC FUNCTION PANEL
ALT: 22 U/L (ref 0–44)
AST: 22 U/L (ref 15–41)
Albumin: 3.5 g/dL (ref 3.5–5.0)
Alkaline Phosphatase: 47 U/L (ref 38–126)
Bilirubin, Direct: <0.1 mg/dL (ref 0.0–0.2)
Total Bilirubin: 0.4 mg/dL (ref 0.3–1.2)
Total Protein: 6.8 g/dL (ref 6.5–8.1)

## 2022-01-22 LAB — URINALYSIS, ROUTINE W REFLEX MICROSCOPIC
Bilirubin Urine: NEGATIVE
Glucose, UA: NEGATIVE mg/dL
Hgb urine dipstick: NEGATIVE
Ketones, ur: NEGATIVE mg/dL
Nitrite: NEGATIVE
Protein, ur: NEGATIVE mg/dL
Specific Gravity, Urine: 1.011 (ref 1.005–1.030)
pH: 5 (ref 5.0–8.0)

## 2022-01-22 LAB — CBG MONITORING, ED
Glucose-Capillary: 92 mg/dL (ref 70–99)
Glucose-Capillary: 145 mg/dL — ABNORMAL HIGH (ref 70–99)
Glucose-Capillary: 179 mg/dL — ABNORMAL HIGH (ref 70–99)
Glucose-Capillary: 180 mg/dL — ABNORMAL HIGH (ref 70–99)

## 2022-01-22 LAB — LIPASE, BLOOD: Lipase: 54 U/L — ABNORMAL HIGH (ref 11–51)

## 2022-01-22 LAB — RESP PANEL BY RT-PCR (RSV, FLU A&B, COVID)  RVPGX2
Influenza A by PCR: NEGATIVE
Influenza B by PCR: NEGATIVE
Resp Syncytial Virus by PCR: NEGATIVE
SARS Coronavirus 2 by RT PCR: NEGATIVE

## 2022-01-22 LAB — MAGNESIUM: Magnesium: 2.7 mg/dL — ABNORMAL HIGH (ref 1.7–2.4)

## 2022-01-22 LAB — POTASSIUM: Potassium: 4.7 mmol/L (ref 3.5–5.1)

## 2022-01-22 MED ORDER — ATORVASTATIN CALCIUM 20 MG PO TABS
40.0000 mg | ORAL_TABLET | Freq: Every day | ORAL | Status: DC
  Administered 2022-01-23 – 2022-01-26 (×4): 40 mg via ORAL
  Filled 2022-01-22 (×5): qty 2

## 2022-01-22 MED ORDER — ACETAMINOPHEN 650 MG RE SUPP: 650.0000 mg | Freq: Four times a day (QID) | RECTAL | Status: DC | PRN

## 2022-01-22 MED ORDER — SODIUM CHLORIDE 0.9 % IV SOLN: INTRAVENOUS | Status: DC

## 2022-01-22 MED ORDER — INSULIN ASPART 100 UNIT/ML IJ SOLN
0.0000 [IU] | Freq: Three times a day (TID) | INTRAMUSCULAR | Status: DC
  Administered 2022-01-22: 2 [IU] via SUBCUTANEOUS
  Administered 2022-01-23 (×2): 1 [IU] via SUBCUTANEOUS
  Administered 2022-01-23: 2 [IU] via SUBCUTANEOUS
  Administered 2022-01-24: 1 [IU] via SUBCUTANEOUS
  Administered 2022-01-24: 2 [IU] via SUBCUTANEOUS
  Administered 2022-01-24: 5 [IU] via SUBCUTANEOUS
  Administered 2022-01-25 (×2): 3 [IU] via SUBCUTANEOUS
  Administered 2022-01-25: 1 [IU] via SUBCUTANEOUS
  Administered 2022-01-26: 3 [IU] via SUBCUTANEOUS
  Administered 2022-01-26: 1 [IU] via SUBCUTANEOUS
  Filled 2022-01-22 (×12): qty 1

## 2022-01-22 MED ORDER — ONDANSETRON HCL 4 MG/2ML IJ SOLN: 4.0000 mg | Freq: Four times a day (QID) | INTRAMUSCULAR | Status: DC | PRN

## 2022-01-22 MED ORDER — ALBUTEROL SULFATE (2.5 MG/3ML) 0.083% IN NEBU
2.5000 mg | INHALATION_SOLUTION | Freq: Four times a day (QID) | RESPIRATORY_TRACT | Status: DC | PRN
  Filled 2022-01-22: qty 3

## 2022-01-22 MED ORDER — LACTATED RINGERS IV BOLUS
1000.0000 mL | Freq: Once | INTRAVENOUS | Status: AC
  Administered 2022-01-22: 1000 mL via INTRAVENOUS

## 2022-01-22 MED ORDER — ACETAMINOPHEN 325 MG PO TABS: 650.0000 mg | ORAL_TABLET | Freq: Four times a day (QID) | ORAL | Status: DC | PRN

## 2022-01-22 MED ORDER — CLOPIDOGREL BISULFATE 75 MG PO TABS
75.0000 mg | ORAL_TABLET | Freq: Every day | ORAL | Status: DC
  Administered 2022-01-22 – 2022-01-25 (×4): 75 mg via ORAL
  Filled 2022-01-22 (×6): qty 1

## 2022-01-22 MED ORDER — INSULIN ASPART 100 UNIT/ML IJ SOLN: 0.0000 [IU] | Freq: Every day | INTRAMUSCULAR | Status: DC

## 2022-01-22 MED ORDER — BENZONATATE 100 MG PO CAPS: 100.0000 mg | ORAL_CAPSULE | Freq: Three times a day (TID) | ORAL | Status: DC | PRN

## 2022-01-22 MED ORDER — GLIPIZIDE 5 MG PO TABS: 5.0000 mg | ORAL_TABLET | Freq: Every day | ORAL | Status: DC

## 2022-01-22 MED ORDER — ONDANSETRON HCL 4 MG PO TABS: 4.0000 mg | ORAL_TABLET | Freq: Four times a day (QID) | ORAL | Status: DC | PRN

## 2022-01-22 MED ORDER — HYDRALAZINE HCL 25 MG PO TABS
25.0000 mg | ORAL_TABLET | Freq: Four times a day (QID) | ORAL | Status: DC | PRN
  Administered 2022-01-23 – 2022-01-24 (×2): 25 mg via ORAL
  Filled 2022-01-22 (×2): qty 1

## 2022-01-22 MED ORDER — PATIROMER SORBITEX CALCIUM 8.4 G PO PACK
8.4000 g | PACK | Freq: Once | ORAL | Status: AC
  Administered 2022-01-22: 8.4 g via ORAL
  Filled 2022-01-22: qty 1

## 2022-01-22 MED ORDER — HEPARIN SODIUM (PORCINE) 5000 UNIT/ML IJ SOLN
5000.0000 [IU] | Freq: Three times a day (TID) | INTRAMUSCULAR | Status: DC
  Administered 2022-01-22 – 2022-01-26 (×13): 5000 [IU] via SUBCUTANEOUS
  Filled 2022-01-22 (×14): qty 1

## 2022-01-22 MED ORDER — ALBUTEROL SULFATE HFA 108 (90 BASE) MCG/ACT IN AERS: 2.0000 | INHALATION_SPRAY | Freq: Four times a day (QID) | RESPIRATORY_TRACT | Status: DC | PRN

## 2022-01-22 MED ORDER — ENOXAPARIN SODIUM 40 MG/0.4ML IJ SOSY: 40.0000 mg | PREFILLED_SYRINGE | INTRAMUSCULAR | Status: DC

## 2022-01-22 MED ORDER — ATORVASTATIN CALCIUM 20 MG PO TABS: 20.0000 mg | ORAL_TABLET | Freq: Every day | ORAL | Status: DC

## 2022-01-22 MED ORDER — MAGNESIUM HYDROXIDE 400 MG/5ML PO SUSP: 30.0000 mL | Freq: Every day | ORAL | Status: DC | PRN

## 2022-01-22 MED ORDER — TRAZODONE HCL 50 MG PO TABS
25.0000 mg | ORAL_TABLET | Freq: Every evening | ORAL | Status: DC | PRN
  Administered 2022-01-23: 25 mg via ORAL
  Filled 2022-01-22: qty 1

## 2022-01-22 MED ORDER — METOPROLOL SUCCINATE ER 50 MG PO TB24
200.0000 mg | ORAL_TABLET | Freq: Every day | ORAL | Status: DC
  Administered 2022-01-23: 200 mg via ORAL
  Filled 2022-01-22 (×3): qty 4

## 2022-01-22 MED ORDER — METOPROLOL SUCCINATE ER 25 MG PO TB24
25.0000 mg | ORAL_TABLET | Freq: Every day | ORAL | Status: DC
Start: 2022-01-22 — End: 2022-01-22

## 2022-01-22 NOTE — ED Notes (Signed)
This Clinical research associate assisted the patient to the bathroom via wheelchair. Patient back to her assigned bed. Patient requested to be on the wheelchair for a bit and will let a staff know when she is ready to be put back in bed. Family at bedside.

## 2022-01-22 NOTE — Assessment & Plan Note (Signed)
-   We will continue Plavix. 

## 2022-01-22 NOTE — ED Notes (Signed)
Assumed care of pt. Pt resting comfortably in bed at this time. Pt denies any current needs or questions. Call light with in reach.   

## 2022-01-22 NOTE — Assessment & Plan Note (Signed)
-   We will continue statin therapy. 

## 2022-01-22 NOTE — ED Notes (Signed)
Dietary called and states they are working on sending food up for pt.

## 2022-01-22 NOTE — H&P (Signed)
[facility]   PATIENT NAME: Gina Kelly    MR#:  [id number]  DATE OF BIRTH:  August 19, 1940  DATE OF ADMISSION:  01/22/2022  PRIMARY CARE PHYSICIAN: [facility]   Patient is coming from: Home  REQUESTING/REFERRING PHYSICIAN: Chesley Noon, MD  CHIEF COMPLAINT:   Chief Complaint  Patient presents with   Dizziness    HISTORY OF PRESENT ILLNESS:  Gina Kelly is a 80 y.o. Caucasian female with medical history significant for type diabetes mellitus, hypertension and CVA, who presented to the emergency room with acute onset dizziness with diminished p.o. intake and recent diarrhea with loose bowel movements which has been intermittent lately.  She was seen in urgent care for UTI recently on 12/22 and was given Bactrim DS that she took for 4 days then advised to stop it because of worsening kidney functions.  She admits to chills without fever.  No chest pain or palpitations.  No cough or wheezing.  No fever or chills.  No nausea or vomiting or abdominal pain.  No current dysuria, oliguria or urinary frequency or urgency or flank pain.  ED Course: When she came to the ER, heart rate was 54 with otherwise normal vital signs.  Labs revealed mild hyponatremia and hyperkalemia with a potassium of 5.8, BUN of 106 with creatinine of 3.61 compared to 72/2.47 on 01/14/2022.  Magnesium was 2.7.  Lipase was 54.  CBC showed anemia close to previous levels.  Influenza antigens COVID-19 PCR and RSV PCR came back negative.  UA was remarkable for 10 WBCs with rare bacteria. EKG as reviewed by me : EKG showed sinus bradycardia with a rate of 53 with low voltage QRS and anteroseptal Q waves. Imaging: Two-view chest x-ray showed no acute cardiopulmonary disease.  The patient was given 1 L bolus of IV lactated Ringer and 500 mL of IV normal saline and 8.4 mg p.o. Veltassa.  The patient will be admitted to a medical telemetry bed for further evaluation and management. PAST MEDICAL HISTORY:    Past Medical History:  Diagnosis Date   Diabetes mellitus without complication (HCC)    Hypertension    Stroke (HCC)    2000    PAST SURGICAL HISTORY:   Past Surgical History:  Procedure Laterality Date   ABDOMINAL HYSTERECTOMY      SOCIAL HISTORY:   Social History   Tobacco Use   Smoking status: Never   Smokeless tobacco: Never  Substance Use Topics   Alcohol use: No    FAMILY HISTORY:  History reviewed. No pertinent family history. 2 sisters had CVA.  History is otherwise positive for hypertension and diabetes mellitus. DRUG ALLERGIES:   Allergies  Allergen Reactions   Metformin Diarrhea    CKD   Penicillins     REVIEW OF SYSTEMS:   ROS As per history of present illness. All pertinent systems were reviewed above. Constitutional, HEENT, cardiovascular, respiratory, GI, GU, musculoskeletal, neuro, psychiatric, endocrine, integumentary and hematologic systems were reviewed and are otherwise negative/unremarkable except for positive findings mentioned above in the HPI.   MEDICATIONS AT HOME:   Prior to Admission medications   Medication Sig Start Date End Date Taking? Authorizing Provider  albuterol (PROVENTIL HFA;VENTOLIN HFA) 108 (90 BASE) MCG/ACT inhaler Inhale 2 puffs into the lungs every 6 (six) hours as needed for wheezing or shortness of breath. 07/28/14   Menshew, Charlesetta Ivory, PA-C  atorvastatin (LIPITOR) 20 MG tablet Take 20 mg by mouth daily.  [provider]  benzonatate (TESSALON PERLES) 100 MG capsule Take 1 capsule (100 mg total) by mouth 3 (three) times daily as needed for cough (Take 1-2 per dose). 07/28/14   Menshew, Charlesetta Ivory, PA-C  cephALEXin (KEFLEX) 500 MG capsule Take 1 capsule (500 mg total) by mouth 2 (two) times daily. 12/17/17   Jene Every, MD  clopidogrel (PLAVIX) 75 MG tablet Take 75 mg by mouth daily.    [provider]  glipiZIDE (GLUCOTROL) 5 MG tablet Take by mouth daily before breakfast.    [provider]  hydrochlorothiazide (HYDRODIURIL) 25 MG tablet Take 25 mg by mouth daily.    [provider]  metFORMIN (GLUCOPHAGE) 500 MG tablet Take by mouth 2 (two) times daily with a meal.    [provider]  metoprolol succinate (TOPROL-XL) 25 MG 24 hr tablet Take 25 mg by mouth daily.    [provider]      VITAL SIGNS:  Blood pressure (!) 156/48, pulse (!) 52, temperature (!) 97.5 F (36.4 C), temperature source Oral, resp. rate 16, height 5\' 2"  (1.575 m), weight 63.5 kg, SpO2 99 %.  PHYSICAL EXAMINATION:  Physical Exam  GENERAL:  81 y.o.-year-old Caucasian female patient lying in the bed with no acute distress.  EYES: Pupils equal, round, reactive to light and accommodation. No scleral icterus. Extraocular muscles intact.  HEENT: Head atraumatic, normocephalic. Oropharynx and nasopharynx clear.  NECK:  Supple, no jugular venous distention. No thyroid enlargement, no tenderness.  LUNGS: Normal breath sounds bilaterally, no wheezing, rales,rhonchi or crepitation. No use of accessory muscles of respiration.  CARDIOVASCULAR: Regular rate and rhythm, S1, S2 normal. No murmurs, rubs, or gallops.  ABDOMEN: Soft, nondistended, nontender. Bowel sounds present. No organomegaly or mass.  EXTREMITIES: No pedal edema, cyanosis, or clubbing.  NEUROLOGIC: Cranial nerves II through XII are intact. Muscle strength 5/5 in all extremities. Sensation intact. Gait not checked.  PSYCHIATRIC: The patient is alert and oriented x 3.  Normal affect and good eye contact. SKIN: No obvious rash, lesion, or ulcer.   LABORATORY PANEL:   CBC Recent Labs  Lab 01/22/22 0450  WBC 7.1  HGB 10.4*  HCT 31.3*  PLT 162   ------------------------------------------------------------------------------------------------------------------  Chemistries  Recent Labs  Lab 01/21/22 1856 01/22/22 0450  NA 133* 134*  K 5.8* 5.4*  CL 108 114*  CO2 16* 13*  GLUCOSE 252* 138*  BUN  106* 93*  CREATININE 3.61* 2.88*  CALCIUM 8.3* 8.3*  MG 2.7*  --   AST 22  --   ALT 22  --   ALKPHOS 47  --   BILITOT 0.4  --    ------------------------------------------------------------------------------------------------------------------  Cardiac Enzymes No results for input(s): "TROPONINI" in the last 168 hours. ------------------------------------------------------------------------------------------------------------------  RADIOLOGY:  DG Chest 2 View  Result Date: 01/22/2022 CLINICAL DATA:  Weakness EXAM: CHEST - 2 VIEW COMPARISON:  09/12/2019 FINDINGS: The heart size and mediastinal contours are within normal limits. Both lungs are clear. The visualized skeletal structures are unremarkable. IMPRESSION: No active cardiopulmonary disease. Electronically Signed   By: 09/14/2019 M.D.   On: 01/22/2022 01:08      IMPRESSION AND PLAN:  Assessment and Plan: * Acute kidney injury superimposed on chronic kidney disease (HCC) - The patient was admitted to a medical telemetry bed. - We will continue hydration with IV normal saline. - We will follow BMPs. - Nephrology consult will be obtained. - I notified Dr. 01/24/2022 about the patient.  Dyslipidemia - We  will continue statin therapy.  Essential hypertension - We will continue Toprol-XL and hold lisinopril HCT given acute kidney injury.  Hyperkalemia - The patient was given p.o. Veltassa and potassium will be followed.   DVT prophylaxis: Lovenox.  Advanced Care Planning:  Code Status: full code.  Family Communication:  The plan of care was discussed in details with the patient (and family). I answered all questions. The patient agreed to proceed with the above mentioned plan. Further management will depend upon hospital course. Disposition Plan: Back to previous home environment Consults called: Nephrology All the records are reviewed and case discussed with ED provider.  Status is: Inpatient   At the time of the  admission, it appears that the appropriate admission status for this patient is inpatient.  This is judged to be reasonable and necessary in order to provide the required intensity of service to ensure the patient's safety given the presenting symptoms, physical exam findings and initial radiographic and laboratory data in the context of comorbid conditions.  The patient requires inpatient status due to high intensity of service, high risk of further deterioration and high frequency of surveillance required.  I certify that at the time of admission, it is my clinical judgment that the patient will require inpatient hospital care extending more than 2 midnights.                            Dispo: The patient is from: Home              Anticipated d/c is to: Home              Patient currently is not medically stable to d/c.              Difficult to place patient: No  Hannah Beat M.D on 01/22/2022 at 5:15 AM  [facility]   From 7 PM-7 AM, contact night-coverage [email or web address]  CC: Primary care physician; [employer]

## 2022-01-22 NOTE — ED Notes (Signed)
2nd Breakfast tray has been delivered at this time. Was also informed by RN Dorene Grebe that a breakfast tray had already been delivered earlier this am and family did not want it.

## 2022-01-22 NOTE — Progress Notes (Addendum)
PROGRESS NOTE    Gina Kelly  [id number] DOB: 1940-04-11 DOA: 01/22/2022 PCP: [facility]    Brief Narrative:   Antinique Przybyszewski is a 81 y.o. female with past medical history significant for type 2 diabetes mellitus, CKD stage IIIb/IV, essential hypertension, hypercholesterolemia, history of chronic diarrhea, Hx CVA who presented to [facility] ED on 12/29 with acute onset dizziness, diminished oral intake, recent diarrhea.  Patient was seen in urgent care for UTI recently on 12/22 and prescribed Bactrim DS in which she took for 4 days and then was advised to discontinue due to worsening renal function.  Patient reports chills without fever.  Denies chest pain, no palpitations, no cough/congestion, no wheezing, no nausea/vomiting, no abdominal pain, no current urinary symptoms.  In the ED, temperature 7.8 F, HR 54, RR 16, BP 125/49, SpO2 98% on room air.  Sodium 133, potassium 5.8, chloride 108, CO2 16, glucose 252, BUN 106, creatinine 3.61.  Lipase 54.  AST 22, ALT 22, total bilirubin 0.4.  WBC 7.1, hemoglobin 10.4, platelets 177.  Cova-19 PCR negative.  Influenza A/B PCR negative.  Urinalysis with small leukocytes, negative nitrite, rare bacteria, 6-10 WBCs.  EKG with sinus bradycardia, rate 53 with low voltage QRS and anteroseptal Q waves.  X-ray chest with no acute cardiopulmonary disease process.  Patient was given 1 L bolus IV LR, 500 mL IV normal saline and 8.4 mg p.o. Veltassa.  TRH consulted for admission for further evaluation management of acute renal failure on CKD stage IIIb and hyperkalemia.  Assessment & Plan:   Acute renal failure on CKD stage IIIb Baseline creatinine 1.4-1.6 with GFR 35.  Creatinine notably elevated to 3.61 on 12/29 with a BUN of 106.  Patient with reported acute on chronic diarrhea also with con commitment Bactrim/HCTZ use is likely confounding factor.  Renal ultrasound with increased cortical echogenicity with no hydronephrosis likely secondary to  chronic medical renal disease. -- Nephrology following, appreciate assistance -- Cr 3.61>2.88 (1.4 -1.6 baseline) -- NS at 100 mL/h -- Hold home HCTZ -- Avoid nephrotoxins, renal dose all medications -- Strict I's and O's -- Repeat BMP in a.m.  Hyperkalemia: Resolved Potassium 5.8 on admission, likely secondary to acute renal failure.  Received Patiromer 8.6g oral x1. -- K 5.8>5.4>4.7 -- Repeat BMP in a.m.  Acute on chronic diarrhea Patient with history of intermittent diarrhea, acutely worsened recently per family.  Recent use of antibiotics with Bactrim. -- Check C. difficile PCR and GI PCR panel -- Enteric precautions -- Strict I's and O's  Essential hypertension -- Metoprolol succinate 25 mg p.o. daily -- Holding home HCTZ due to renal failure as above  Hypercholesterolemia -- Atorvastatin 20 mg p.o. daily  Type 2 diabetes mellitus Hemoglobin A1c 6.4 10/21/2021, well-controlled.  Home regimen includes metformin 500 mg p.o. twice daily, and glipizide 5 mg p.o. daily. -- Hold oral hypoglycemics while inpatient -- SSI for coverage -- CBGs qAC/HS  Hx CVA -- Plavix 75 mg p.o. daily  Weakness/debility/deconditioning: Family reports recent falls at home.  Lives with daughter. --PT/OT evaluation   DVT prophylaxis: heparin injection 5,000 Units Start: 01/22/22 0600    Code Status: Full Code Family Communication: Updated 2 daughters present at bedside this morning  Disposition Plan:  Level of care: Telemetry Medical Status is: Inpatient Remains inpatient appropriate because: IV fluid hydration, needs improvement in renal function before ready for discharge home, anticipate 1-2 days    Consultants:  Nephrology, Dr. Holley Raring  Procedures:  None  Antimicrobials:  None  Subjective: Patient seen examined bedside, resting comfortably.  Lying in bed.  Remains in ED holding area in the hallway.  2 daughters present and updated.  Daughter is upset that patient remains in  the hallway and that she has not received breakfast this morning.  Discussed plan of care and that likely symptoms of dizziness related to acute on chronic renal failure with uremia likely stemming from recent Bactrim use on top of underlying HCTZ use for blood pressure and acute on chronic diarrhea.  Also discussed concern about acute worsening diarrhea in the setting of recent antibiotic use concerning for infectious etiology such as C. difficile.  No other questions or concerns at this time.  Patient denies headache, no fever/chills/night sweats, no nausea/vomiting, no chest pain, palpitations, no shortness of breath, no abdominal pain, no focal weakness, no fatigue, no paresthesias.  No acute events overnight per nursing staff.  Objective: Vitals:   01/22/22 0230 01/22/22 0500 01/22/22 0630 01/22/22 0800  BP: (!) 145/56 (!) 156/48 (!) 152/50 (!) 156/66  Pulse: (!) 57 (!) 52 (!) 50 (!) 57  Resp: 16 16 16 16   Temp:      TempSrc:      SpO2: 95% 99% 96% 98%  Weight:      Height:       No intake or output data in the 24 hours ending 01/22/22 1259 Filed Weights   01/21/22 1840  Weight: 63.5 kg    Examination:  Physical Exam: GEN: NAD, alert and oriented x 3, chronically ill in appearance HEENT: NCAT, PERRL, EOMI, sclera clear, MMM PULM: CTAB w/o wheezes/crackles, normal respiratory effort, on room air CV: RRR w/o M/G/R GI: abd soft, NTND, NABS, no R/G/M MSK: no peripheral edema, muscle strength globally intact 5/5 bilateral upper/lower extremities NEURO: CN II-XII intact, no focal deficits, moves all independently Integumentary: dry/intact, no rashes or wounds    Data Reviewed: I have personally reviewed following labs and imaging studies  CBC: Recent Labs  Lab 01/21/22 1856 01/22/22 0450  WBC 7.1 7.1  HGB 10.4* 10.4*  HCT 31.3* 31.3*  MCV 85.1 85.5  PLT 177 0000000   Basic Metabolic Panel: Recent Labs  Lab 01/21/22 1856 01/22/22 0450 01/22/22 1050  NA 133* 134*  --    K 5.8* 5.4* 4.7  CL 108 114*  --   CO2 16* 13*  --   GLUCOSE 252* 138*  --   BUN 106* 93*  --   CREATININE 3.61* 2.88*  --   CALCIUM 8.3* 8.3*  --   MG 2.7*  --   --    GFR: Estimated Creatinine Clearance: 13.7 mL/min (A) (by C-G formula based on SCr of 2.88 mg/dL (H)). Liver Function Tests: Recent Labs  Lab 01/21/22 1856  AST 22  ALT 22  ALKPHOS 47  BILITOT 0.4  PROT 6.8  ALBUMIN 3.5   Recent Labs  Lab 01/21/22 1856  LIPASE 54*   No results for input(s): "AMMONIA" in the last 168 hours. Coagulation Profile: No results for input(s): "INR", "PROTIME" in the last 168 hours. Cardiac Enzymes: No results for input(s): "CKTOTAL", "CKMB", "CKMBINDEX", "TROPONINI" in the last 168 hours. BNP (last 3 results) No results for input(s): "PROBNP" in the last 8760 hours. HbA1C: No results for input(s): "HGBA1C" in the last 72 hours. CBG: Recent Labs  Lab 01/22/22 0754 01/22/22 1216  GLUCAP 92 145*   Lipid Profile: No results for input(s): "CHOL", "HDL", "LDLCALC", "TRIG", "CHOLHDL", "LDLDIRECT" in the last 72 hours. Thyroid Function Tests:  No results for input(s): "TSH", "T4TOTAL", "FREET4", "T3FREE", "THYROIDAB" in the last 72 hours. Anemia Panel: No results for input(s): "VITAMINB12", "FOLATE", "FERRITIN", "TIBC", "IRON", "RETICCTPCT" in the last 72 hours. Sepsis Labs: No results for input(s): "PROCALCITON", "LATICACIDVEN" in the last 168 hours.  Recent Results (from the past 240 hour(s))  Urine Culture     Status: Abnormal   Collection Time: 01/14/22  5:18 PM   Specimen: Urine, Clean Catch  Result Value Ref Range Status   Specimen Description URINE, CLEAN CATCH  Final   Special Requests   Final    NONE Performed at [facility], [address] [place], [place] [address]    Culture >=100,000 COLONIES/mL ESCHERICHIA COLI (A)  Final   Report Status 01/17/2022 FINAL  Final   Organism ID, Bacteria ESCHERICHIA COLI (A)  Final      Susceptibility   Escherichia  coli - MIC*    AMPICILLIN 8 SENSITIVE Sensitive     CEFAZOLIN <=4 SENSITIVE Sensitive     CEFEPIME <=0.12 SENSITIVE Sensitive     CEFTRIAXONE <=0.25 SENSITIVE Sensitive     CIPROFLOXACIN <=0.25 SENSITIVE Sensitive     GENTAMICIN <=1 SENSITIVE Sensitive     IMIPENEM <=0.25 SENSITIVE Sensitive     NITROFURANTOIN <=16 SENSITIVE Sensitive     TRIMETH/SULFA <=20 SENSITIVE Sensitive     AMPICILLIN/SULBACTAM 4 SENSITIVE Sensitive     PIP/TAZO <=4 SENSITIVE Sensitive     * >=100,000 COLONIES/mL ESCHERICHIA COLI  Resp panel by RT-PCR (RSV, Flu A&B, Covid) Anterior Nasal Swab     Status: None   Collection Time: 01/22/22 12:35 AM   Specimen: Anterior Nasal Swab  Result Value Ref Range Status   SARS Coronavirus 2 by RT PCR NEGATIVE NEGATIVE Final    Comment: (NOTE) SARS-CoV-2 target nucleic acids are NOT DETECTED.  The SARS-CoV-2 RNA is generally detectable in upper respiratory specimens during the acute phase of infection. The lowest concentration of SARS-CoV-2 viral copies this assay can detect is 138 copies/mL. A negative result does not preclude SARS-Cov-2 infection and should not be used as the sole basis for treatment or other patient management decisions. A negative result may occur with  improper specimen collection/handling, submission of specimen other than nasopharyngeal swab, presence of viral mutation(s) within the areas targeted by this assay, and inadequate number of viral copies(<138 copies/mL). A negative result must be combined with clinical observations, patient history, and epidemiological information. The expected result is Negative.  Fact Sheet for Patients:  EntrepreneurPulse.com.au  Fact Sheet for Healthcare Providers:  IncredibleEmployment.be  This test is no t yet approved or cleared by the [place] FDA and  has been authorized for detection and/or diagnosis of SARS-CoV-2 by FDA under an Emergency Use Authorization (EUA).  This EUA will remain  in effect (meaning this test can be used) for the duration of the COVID-19 declaration under Section 564(b)(1) of the Act, 21 U.S.C.section 360bbb-3(b)(1), unless the authorization is terminated  or revoked sooner.       Influenza A by PCR NEGATIVE NEGATIVE Final   Influenza B by PCR NEGATIVE NEGATIVE Final    Comment: (NOTE) The Xpert Xpress SARS-CoV-2/FLU/RSV plus assay is intended as an aid in the diagnosis of influenza from Nasopharyngeal swab specimens and should not be used as a sole basis for treatment. Nasal washings and aspirates are unacceptable for Xpert Xpress SARS-CoV-2/FLU/RSV testing.  Fact Sheet for Patients: EntrepreneurPulse.com.au  Fact Sheet for Healthcare Providers: IncredibleEmployment.be  This test is not yet approved or cleared  by the [place] and has been authorized for detection and/or diagnosis of SARS-CoV-2 by FDA under an Emergency Use Authorization (EUA). This EUA will remain in effect (meaning this test can be used) for the duration of the COVID-19 declaration under Section 564(b)(1) of the Act, 21 U.S.C. section 360bbb-3(b)(1), unless the authorization is terminated or revoked.     Resp Syncytial Virus by PCR NEGATIVE NEGATIVE Final    Comment: (NOTE) Fact Sheet for Patients: BloggerCourse.com  Fact Sheet for Healthcare Providers: SeriousBroker.it  This test is not yet approved or cleared by the [place] FDA and has been authorized for detection and/or diagnosis of SARS-CoV-2 by FDA under an Emergency Use Authorization (EUA). This EUA will remain in effect (meaning this test can be used) for the duration of the COVID-19 declaration under Section 564(b)(1) of the Act, 21 U.S.C. section 360bbb-3(b)(1), unless the authorization is terminated or revoked.  Performed at [facility], [address]  [place], [place] [address]          Radiology Studies: US RENAL  Result Date: 01/22/2022 CLINICAL DATA:  Acute renal failure. EXAM: RENAL / URINARY TRACT ULTRASOUND COMPLETE COMPARISON:  Renal ultrasound 03/30/2014; CT abdomen and pelvis without contrast 03/19/2014 FINDINGS: Right Kidney: Renal measurements: 10.1 x 4.9 x 4.9 cm = volume: 129 mL. Increased cortical echogenicity. The cortical thickness measures 0.8 cm. Within the mid to upper pole there is a partially exophytic anechoic benign simple cyst measuring 7 x 6 x 6 mm. There are some nonshadowing echogenic foci within the parapelvic fat measuring up to 6 mm, 5 mm, and 6 mm that are favored represent foci of fat. Nonobstructing stones are felt less likely. No hydronephrosis. Left Kidney: Renal measurements: 10.2 x 5.1 x 4.8 cm = volume: 130 mL. Increased cortical echogenicity. The cortical thickness measures 1.1 cm. Avascular benign parapelvic simple cysts are seen measuring up to 1.2 cm within the mid to upper pole and 0.9 cm within the midpole. Bladder: Appears normal for degree of bladder distention. Other: None. IMPRESSION: 1. Increased cortical echogenicity of the bilateral kidneys suggestive of medical renal disease. 2. No hydronephrosis. 3. There are some nonshadowing echogenic foci within the parapelvic fat measuring up to 6 mm, 5 mm, and 6 mm that are favored represent normal focal fat. Nonobstructing stones are possible but felt less likely. 4. Bilateral benign simple renal cysts. No follow-up imaging is recommended. Electronically Signed   By: Neita Garnet M.D.   On: 01/22/2022 11:57   DG Chest 2 View  Result Date: 01/22/2022 CLINICAL DATA:  Weakness EXAM: CHEST - 2 VIEW COMPARISON:  09/12/2019 FINDINGS: The heart size and mediastinal contours are within normal limits. Both lungs are clear. The visualized skeletal structures are unremarkable. IMPRESSION: No active cardiopulmonary disease. Electronically Signed   By: Deatra Robinson M.D.    On: 01/22/2022 01:08        Scheduled Meds:  atorvastatin  20 mg Oral Daily   clopidogrel  75 mg Oral Daily   heparin injection (subcutaneous)  5,000 Units Subcutaneous Q8H   insulin aspart  0-5 Units Subcutaneous QHS   insulin aspart  0-9 Units Subcutaneous TID WC   metoprolol succinate  25 mg Oral Daily   Continuous Infusions:  sodium chloride 100 mL/hr at 01/22/22 0458     LOS: 0 days    Time spent: 52 minutes spent on chart review, discussion with nursing staff, consultants, updating family and interview/physical exam; more than 50% of that time was  spent in counseling and/or coordination of care.    Hazell Siwik J [place] DO [facility] Available via Epic secure chat 7am-7pm After these hours, please refer to coverage provider listed on [email or web address] 01/22/2022, 12:59 PM

## 2022-01-22 NOTE — Assessment & Plan Note (Signed)
-   We will continue Toprol-XL and hold lisinopril HCT given acute kidney injury.

## 2022-01-22 NOTE — ED Notes (Signed)
Pt went to restroom in wheelchair. Pt sitting in wheelchair with family at this time.

## 2022-01-22 NOTE — ED Provider Notes (Signed)
[facility] Provider Note    Event Date/Time   First MD Initiated Contact with Patient 01/22/22 0009     (approximate)   History   Chief Complaint Dizziness   HPI  Gina Kelly is a 81 y.o. female with past medical history of hypertension, diabetes, and stroke who presents to the ED complaining of weakness and dizziness.  Per daughter, patient initially started "feeling bad" about 1 week ago with some foul-smelling urine.  She was seen at urgent care for this and prescribed Bactrim, followed up with her PCP 3 days ago where there was concern for worsening renal function and Bactrim was stopped.  Since then, patient has continued to feel weak with pain in her abdomen, nausea, and diarrhea.  She has not had any fevers or cough, but does states she has felt slightly short of breath recently.  Family is not aware of any sick contacts.     Physical Exam   Triage Vital Signs: ED Triage Vitals [01/21/22 1840]  Enc Vitals Group     BP (!) 125/49     Pulse Rate (!) 54     Resp 16     Temp 97.8 F (36.6 C)     Temp Source Oral     SpO2 98 %     Weight 140 lb (63.5 kg)     Height 5\' 2"  (1.575 m)     Head Circumference      Peak Flow      Pain Score 4     Pain Loc      Pain Edu?      Excl. in GC?     Most recent vital signs: Vitals:   01/22/22 0200 01/22/22 0230  BP: (!) 160/56 (!) 145/56  Pulse: (!) 55 (!) 57  Resp: 16 16  Temp:    SpO2: 97% 95%    Constitutional: Alert and oriented. Eyes: Conjunctivae are normal. Head: Atraumatic. Nose: No congestion/rhinnorhea. Mouth/Throat: Mucous membranes are dry. Cardiovascular: Normal rate, regular rhythm. Grossly normal heart sounds.  2+ radial pulses bilaterally. Respiratory: Normal respiratory effort.  No retractions. Lungs CTAB. Gastrointestinal: Soft and nontender. No distention. Musculoskeletal: No lower extremity tenderness nor edema.  Neurologic:  Normal speech and language. No gross focal  neurologic deficits are appreciated.    ED Results / Procedures / Treatments   Labs (all labs ordered are listed, but only abnormal results are displayed) Labs Reviewed  BASIC METABOLIC PANEL - Abnormal; Notable for the following components:      Result Value   Sodium 133 (*)    Potassium 5.8 (*)    CO2 16 (*)    Glucose, Bld 252 (*)    BUN 106 (*)    Creatinine, Ser 3.61 (*)    Calcium 8.3 (*)    GFR, Estimated 12 (*)    All other components within normal limits  CBC - Abnormal; Notable for the following components:   RBC 3.68 (*)    Hemoglobin 10.4 (*)    HCT 31.3 (*)    All other components within normal limits  URINALYSIS, ROUTINE W REFLEX MICROSCOPIC - Abnormal; Notable for the following components:   Color, Urine YELLOW (*)    APPearance CLEAR (*)    Leukocytes,Ua SMALL (*)    Bacteria, UA RARE (*)    All other components within normal limits  RESP PANEL BY RT-PCR (RSV, FLU A&B, COVID)  [id number]  MAGNESIUM  HEPATIC FUNCTION PANEL  LIPASE, BLOOD  EKG  ED ECG REPORT I, Chesley Noon, the attending physician, personally viewed and interpreted this ECG.   Date: 01/22/2022  EKG Time: 18:46  Rate: 53  Rhythm: sinus bradycardia  Axis: Normal  Intervals:none  ST&T Change: None  RADIOLOGY Chest x-ray reviewed and interpreted by me with no infiltrate, edema, or effusion.  PROCEDURES:  Critical Care performed: Yes, see critical care procedure note(s)  .Critical Care  Performed by: Chesley Noon, MD Authorized by: Chesley Noon, MD   Critical care provider statement:    Critical care time (minutes):  30   Critical care time was exclusive of:  Separately billable procedures and treating other patients and teaching time   Critical care was necessary to treat or prevent imminent or life-threatening deterioration of the following conditions:  Renal failure   Critical care was time spent personally by me on the following activities:  Development of  treatment plan with patient or surrogate, discussions with consultants, evaluation of patient's response to treatment, examination of patient, ordering and review of laboratory studies, ordering and review of radiographic studies, ordering and performing treatments and interventions, pulse oximetry, re-evaluation of patient's condition and review of old charts   I assumed direction of critical care for this patient from another provider in my specialty: no     Care discussed with: admitting provider      MEDICATIONS ORDERED IN ED: Medications  sodium chloride 0.9 % bolus 500 mL (0 mLs Intravenous Stopped 01/22/22 0023)  sodium chloride 0.9 % bolus 500 mL (0 mLs Intravenous Stopped 01/22/22 0053)  lactated ringers bolus 1,000 mL (0 mLs Intravenous Stopped 01/22/22 0239)  patiromer Lelon Perla) packet 8.4 g (8.4 g Oral Given 01/22/22 0051)     IMPRESSION / MDM / ASSESSMENT AND PLAN / ED COURSE  I reviewed the triage vital signs and the nursing notes.                              81 y.o. female with past medical history of hypertension, diabetes, and stroke who presents to the ED complaining of 1 week of generalized weakness, nausea, malaise, and diarrhea.  Patient's presentation is most consistent with acute presentation with potential threat to life or bodily function.  Differential diagnosis includes, but is not limited to, dehydration, electrolyte abnormality, AKI, anemia, UTI, gastroenteritis, appendicitis, cholecystitis, diverticulitis, pneumonia, COVID-19, influenza.  Patient nontoxic-appearing and in no acute distress, vital signs remarkable for bradycardia but otherwise reassuring with stable blood pressure.  EKG shows sinus bradycardia with no ischemic changes, labs from triage also remarkable for worsening AKI with mild associated hyperkalemia.  No related EKG changes noted, will hydrate with IV fluids and treat with dose of Veltassa.  No significant anemia or leukocytosis noted, will  add on LFTs and lipase given her abdominal pain but no tenderness to necessitate CT imaging.  Urinalysis is pending, will also check chest x-ray and viral panel, anticipate admission to the hospital service.  Chest x-ray is unremarkable, COVID and flu testing is negative.  Urinalysis shows no evidence of ongoing UTI.  Case discussed with hospitalist for admission.      FINAL CLINICAL IMPRESSION(S) / ED DIAGNOSES   Final diagnoses:  Acute renal failure, unspecified acute renal failure type (HCC)  Generalized weakness     Rx / DC Orders   ED Discharge Orders     None        Note:  This document was prepared using Dragon  voice recognition software and may include unintentional dictation errors.   Chesley Noon, MD 01/22/22 [phone]

## 2022-01-22 NOTE — ED Notes (Signed)
Family informed by this RN that I called dietary and that they are working on the tray. Family became ugly with this RN and stated that she didn't need to speak to me and that I could go away. This RN stated to family that she didn't need to be ugly and that we are doing to the best we can. The family states that we don't need to treat their mom this way. This RN states to family that we are not treating her mom anyway and that we are trying to get her breakfast. Family continues to be ugly with staff. AC called and security informed of situation.

## 2022-01-22 NOTE — Consult Note (Signed)
[facility]  CONSULT NOTE    Date: 01/22/2022                  Patient Name:  Gina Kelly  MRN: [id number]  DOB: 18-Sep-1940  Age / Sex: 81 y.o., female         PCP: [facility]                 Service Requesting Consult: TRH                 Reason for Consult: Acute kidney injury            History of Present Illness: Gina Kelly is a 81 y.o.  female with past medical history diabetes, hypertension, CVA and UTI, who was admitted to [facility] on 01/22/2022 for Acute kidney injury superimposed on chronic kidney disease (HCC) [N17.9, N18.9]  Patient  states she has not felt well for a few days. According to chart review, patient was recently treated for UTI and completed antibiotic therapy. Daughter stated on admission that patient has maintained poor appetite. No lower extremity edema and remains on room air.   Labs on ED arrival include sodium 133, potassium 5.8, glucose 252, BUN 106, creatinine 3.61 and GFR 12. Hgb 10.4.  Respiratory panel negative for influenza, COVID-19, and RSV.  UA appears clear with some bacteria.  No leukocytes.  Chest x-ray negative for acute findings.  Medications: Outpatient medications: (Not in a hospital admission)   Current medications: Current Facility-Administered Medications  Medication Dose Route Frequency Provider Last Rate Last Admin   0.9 %  sodium chloride infusion   Intravenous Continuous Mansy, Jan A, MD 100 mL/hr at 01/22/22 0458 New Bag at 01/22/22 0458   acetaminophen (TYLENOL) tablet 650 mg  650 mg Oral Q6H PRN Mansy, Jan A, MD       Or   acetaminophen (TYLENOL) suppository 650 mg  650 mg Rectal Q6H PRN Mansy, Jan A, MD       albuterol (PROVENTIL) (2.5 MG/3ML) 0.083% nebulizer solution 2.5 mg  2.5 mg Nebulization Q6H PRN Otelia Sergeant, RPH       atorvastatin (LIPITOR) tablet 20 mg  20 mg Oral Daily Mansy, Jan A, MD       benzonatate (TESSALON) capsule 100 mg  100 mg Oral TID PRN Mansy, Jan A,  MD       clopidogrel (PLAVIX) tablet 75 mg  75 mg Oral Daily Mansy, Jan A, MD       heparin injection 5,000 Units  5,000 Units Subcutaneous Q8H Otelia Sergeant, RPH   5,000 Units at 01/22/22 0515   insulin aspart (novoLOG) injection 0-5 Units  0-5 Units Subcutaneous QHS [place], Eric J, DO       insulin aspart (novoLOG) injection 0-9 Units  0-9 Units Subcutaneous TID WC [place], Eric J, DO       magnesium hydroxide (MILK OF MAGNESIA) suspension 30 mL  30 mL Oral Daily PRN Mansy, Jan A, MD       metoprolol succinate (TOPROL-XL) 24 hr tablet 25 mg  25 mg Oral Daily Mansy, Jan A, MD       ondansetron [facility]) tablet 4 mg  4 mg Oral Q6H PRN Mansy, Jan A, MD       Or   ondansetron [facility]) injection 4 mg  4 mg Intravenous Q6H PRN Mansy, Jan A, MD       traZODone (DESYREL) tablet 25 mg  25 mg Oral  QHS PRN Mansy, Arvella Merles, MD       Current Outpatient Medications  Medication Sig Dispense Refill   albuterol (PROVENTIL HFA;VENTOLIN HFA) 108 (90 BASE) MCG/ACT inhaler Inhale 2 puffs into the lungs every 6 (six) hours as needed for wheezing or shortness of breath. 1 Inhaler 0   atorvastatin (LIPITOR) 20 MG tablet Take 20 mg by mouth daily.     benzonatate (TESSALON PERLES) 100 MG capsule Take 1 capsule (100 mg total) by mouth 3 (three) times daily as needed for cough (Take 1-2 per dose). 30 capsule 0   cephALEXin (KEFLEX) 500 MG capsule Take 1 capsule (500 mg total) by mouth 2 (two) times daily. 14 capsule 0   clopidogrel (PLAVIX) 75 MG tablet Take 75 mg by mouth daily.     glipiZIDE (GLUCOTROL) 5 MG tablet Take by mouth daily before breakfast.     hydrochlorothiazide (HYDRODIURIL) 25 MG tablet Take 25 mg by mouth daily.     metFORMIN (GLUCOPHAGE) 500 MG tablet Take by mouth 2 (two) times daily with a meal.     metoprolol succinate (TOPROL-XL) 25 MG 24 hr tablet Take 25 mg by mouth daily.        Allergies: Allergies  Allergen Reactions   Metformin Diarrhea    CKD   Penicillins       Past Medical  History: Past Medical History:  Diagnosis Date   Diabetes mellitus without complication ([place])    Hypertension    Stroke ([place])    2000     Past Surgical History: Past Surgical History:  Procedure Laterality Date   ABDOMINAL HYSTERECTOMY       Family History: History reviewed. No pertinent family history.   Social History: Social History   Socioeconomic History   Marital status: Widowed    Spouse name: Not on file   Number of children: Not on file   Years of education: Not on file   Highest education level: Not on file  Occupational History   Not on file  Tobacco Use   Smoking status: Never   Smokeless tobacco: Never  Substance and Sexual Activity   Alcohol use: No   Drug use: No   Sexual activity: Not on file  Other Topics Concern   Not on file  Social History Narrative   Not on file   Social Determinants of Health   Financial Resource Strain: Not on file  Food Insecurity: Not on file  Transportation Needs: Not on file  Physical Activity: Not on file  Stress: Not on file  Social Connections: Not on file  Intimate Partner Violence: Not on file     Review of Systems: Review of Systems  Constitutional:  Negative for chills, fever and malaise/fatigue.  HENT:  Negative for congestion, sore throat and tinnitus.   Eyes:  Negative for blurred vision and redness.  Respiratory:  Negative for cough, shortness of breath and wheezing.   Cardiovascular:  Negative for chest pain, palpitations, claudication and leg swelling.  Gastrointestinal:  Negative for abdominal pain, blood in stool, diarrhea, nausea and vomiting.  Genitourinary:  Negative for flank pain, frequency and hematuria.  Musculoskeletal:  Negative for back pain, falls and myalgias.  Skin:  Negative for rash.  Neurological:  Positive for dizziness. Negative for weakness and headaches.  Endo/Heme/Allergies:  Does not bruise/bleed easily.  Psychiatric/Behavioral:  Negative for depression. The patient is  not nervous/anxious and does not have insomnia.     Vital Signs: Blood pressure (!) 156/66, pulse (!) 57,  temperature (!) 97.5 F (36.4 C), temperature source Oral, resp. rate 16, height 5\' 2"  (1.575 m), weight 63.5 kg, SpO2 98 %.  Weight trends: Filed Weights   01/21/22 1840  Weight: 63.5 kg    Physical Exam: General: NAD  Head: Normocephalic, atraumatic. Moist oral mucosal membranes  Eyes: Anicteric  Lungs:  Clear to auscultation, normal effort, room air  Heart: Regular rate and rhythm  Abdomen:  Soft, nontender  Extremities:  No peripheral edema.  Neurologic: Nonfocal, moving all four extremities  Skin: No lesions  Access: None     Lab results: Basic Metabolic Panel: Recent Labs  Lab 01/21/22 1856 01/22/22 0450 01/22/22 1050  NA 133* 134*  --   K 5.8* 5.4* 4.7  CL 108 114*  --   CO2 16* 13*  --   GLUCOSE 252* 138*  --   BUN 106* 93*  --   CREATININE 3.61* 2.88*  --   CALCIUM 8.3* 8.3*  --   MG 2.7*  --   --     Liver Function Tests: Recent Labs  Lab 01/21/22 1856  AST 22  ALT 22  ALKPHOS 47  BILITOT 0.4  PROT 6.8  ALBUMIN 3.5   Recent Labs  Lab 01/21/22 1856  LIPASE 54*   No results for input(s): "AMMONIA" in the last 168 hours.  CBC: Recent Labs  Lab 01/21/22 1856 01/22/22 0450  WBC 7.1 7.1  HGB 10.4* 10.4*  HCT 31.3* 31.3*  MCV 85.1 85.5  PLT 177 162    Cardiac Enzymes: No results for input(s): "CKTOTAL", "CKMB", "CKMBINDEX", "TROPONINI" in the last 168 hours.  BNP: Invalid input(s): "POCBNP"  CBG: Recent Labs  Lab 01/22/22 0754 01/22/22 1216  GLUCAP 92 145*    Microbiology: Results for orders placed or performed during the hospital encounter of 01/22/22  Resp panel by RT-PCR (RSV, Flu A&B, Covid) Anterior Nasal Swab     Status: None   Collection Time: 01/22/22 12:35 AM   Specimen: Anterior Nasal Swab  Result Value Ref Range Status   SARS Coronavirus 2 by RT PCR NEGATIVE NEGATIVE Final    Comment: (NOTE) SARS-CoV-2  target nucleic acids are NOT DETECTED.  The SARS-CoV-2 RNA is generally detectable in upper respiratory specimens during the acute phase of infection. The lowest concentration of SARS-CoV-2 viral copies this assay can detect is 138 copies/mL. A negative result does not preclude SARS-Cov-2 infection and should not be used as the sole basis for treatment or other patient management decisions. A negative result may occur with  improper specimen collection/handling, submission of specimen other than nasopharyngeal swab, presence of viral mutation(s) within the areas targeted by this assay, and inadequate number of viral copies(<138 copies/mL). A negative result must be combined with clinical observations, patient history, and epidemiological information. The expected result is Negative.  Fact Sheet for Patients:  EntrepreneurPulse.com.au  Fact Sheet for Healthcare Providers:  IncredibleEmployment.be  This test is no t yet approved or cleared by the [place] FDA and  has been authorized for detection and/or diagnosis of SARS-CoV-2 by FDA under an Emergency Use Authorization (EUA). This EUA will remain  in effect (meaning this test can be used) for the duration of the COVID-19 declaration under Section 564(b)(1) of the Act, 21 U.S.C.section 360bbb-3(b)(1), unless the authorization is terminated  or revoked sooner.       Influenza A by PCR NEGATIVE NEGATIVE Final   Influenza B by PCR NEGATIVE NEGATIVE Final    Comment: (NOTE) The Xpert Xpress  SARS-CoV-2/FLU/RSV plus assay is intended as an aid in the diagnosis of influenza from Nasopharyngeal swab specimens and should not be used as a sole basis for treatment. Nasal washings and aspirates are unacceptable for Xpert Xpress SARS-CoV-2/FLU/RSV testing.  Fact Sheet for Patients: BloggerCourse.com  Fact Sheet for Healthcare  Providers: SeriousBroker.it  This test is not yet approved or cleared by the [place] FDA and has been authorized for detection and/or diagnosis of SARS-CoV-2 by FDA under an Emergency Use Authorization (EUA). This EUA will remain in effect (meaning this test can be used) for the duration of the COVID-19 declaration under Section 564(b)(1) of the Act, 21 U.S.C. section 360bbb-3(b)(1), unless the authorization is terminated or revoked.     Resp Syncytial Virus by PCR NEGATIVE NEGATIVE Final    Comment: (NOTE) Fact Sheet for Patients: BloggerCourse.com  Fact Sheet for Healthcare Providers: SeriousBroker.it  This test is not yet approved or cleared by the [place] FDA and has been authorized for detection and/or diagnosis of SARS-CoV-2 by FDA under an Emergency Use Authorization (EUA). This EUA will remain in effect (meaning this test can be used) for the duration of the COVID-19 declaration under Section 564(b)(1) of the Act, 21 U.S.C. section 360bbb-3(b)(1), unless the authorization is terminated or revoked.  Performed at [facility], [address] [place], [place] [address]     Coagulation Studies: No results for input(s): "LABPROT", "INR" in the last 72 hours.  Urinalysis: Recent Labs    01/22/22 0200  COLORURINE YELLOW*  LABSPEC 1.011  PHURINE 5.0  GLUCOSEU NEGATIVE  HGBUR NEGATIVE  BILIRUBINUR NEGATIVE  KETONESUR NEGATIVE  PROTEINUR NEGATIVE  NITRITE NEGATIVE  LEUKOCYTESUR SMALL*      Imaging: US RENAL  Result Date: 01/22/2022 CLINICAL DATA:  Acute renal failure. EXAM: RENAL / URINARY TRACT ULTRASOUND COMPLETE COMPARISON:  Renal ultrasound 03/30/2014; CT abdomen and pelvis without contrast 03/19/2014 FINDINGS: Right Kidney: Renal measurements: 10.1 x 4.9 x 4.9 cm = volume: 129 mL. Increased cortical echogenicity. The cortical thickness measures 0.8 cm. Within  the mid to upper pole there is a partially exophytic anechoic benign simple cyst measuring 7 x 6 x 6 mm. There are some nonshadowing echogenic foci within the parapelvic fat measuring up to 6 mm, 5 mm, and 6 mm that are favored represent foci of fat. Nonobstructing stones are felt less likely. No hydronephrosis. Left Kidney: Renal measurements: 10.2 x 5.1 x 4.8 cm = volume: 130 mL. Increased cortical echogenicity. The cortical thickness measures 1.1 cm. Avascular benign parapelvic simple cysts are seen measuring up to 1.2 cm within the mid to upper pole and 0.9 cm within the midpole. Bladder: Appears normal for degree of bladder distention. Other: None. IMPRESSION: 1. Increased cortical echogenicity of the bilateral kidneys suggestive of medical renal disease. 2. No hydronephrosis. 3. There are some nonshadowing echogenic foci within the parapelvic fat measuring up to 6 mm, 5 mm, and 6 mm that are favored represent normal focal fat. Nonobstructing stones are possible but felt less likely. 4. Bilateral benign simple renal cysts. No follow-up imaging is recommended. Electronically Signed   By: Neita Garnet M.D.   On: 01/22/2022 11:57   DG Chest 2 View  Result Date: 01/22/2022 CLINICAL DATA:  Weakness EXAM: CHEST - 2 VIEW COMPARISON:  09/12/2019 FINDINGS: The heart size and mediastinal contours are within normal limits. Both lungs are clear. The visualized skeletal structures are unremarkable. IMPRESSION: No active cardiopulmonary disease. Electronically Signed   By: Chrisandra Netters.D.  On: 01/22/2022 01:08     Assessment & Plan: Gina Kelly is a 82 y.o.  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  Baseline creatinine appears to be 1.55 with GFR 34 on 08/06/2021.  Acute kidney injury appears  secondary to hypovolemia and decreased oral intake.  Chronic kidney disease likely secondary to hypertension and diabetes.  Renal ultrasound ordered to evaluate obstruction.  Nonobstructive renal cyst noted.  Agree with IV fluids for now and patient encouraged to increase oral intake.  No acute indication for dialysis at this time.  Will continue to monitor labs and urine output.  2. Anemia of chronic kidney disease Lab Results  Component Value Date   HGB 10.4 (L) 01/22/2022    Hemoglobin above desired target.  Will consider ESA's if below 10.  3. Diabetes mellitus type II with chronic kidney disease/renal manifestations: noninsulin dependent. Home regimen includes metformin and glipizide.  . 4. Hypertension with chronic kidney disease.  Currently with hydrochlorothiazide and metoprolol outpatient.  Only receiving metoprolol at this time, hydralazine available as needed.  LOS: 0 [place] Fors 12/30/20231:18 PM

## 2022-01-22 NOTE — Assessment & Plan Note (Signed)
-   The patient was given p.o. Veltassa and potassium will be followed.

## 2022-01-22 NOTE — Assessment & Plan Note (Signed)
-   The patient was admitted to a medical telemetry bed. - We will continue hydration with IV normal saline. - We will follow BMPs. - Nephrology consult will be obtained. - I notified Dr. Cherylann Ratel about the patient.

## 2022-01-22 NOTE — ED Notes (Addendum)
Family approached this RN regarding breakfast tray for pt. Family informed that this RN didn't think trays had come around yet. Family states that she has seen several come up and she can just go home and fix food or go downstairs and do their job for them. Family informed that this RN would call dietary and check on food.

## 2022-01-23 DIAGNOSIS — N189 Chronic kidney disease, unspecified: Secondary | ICD-10-CM | POA: Diagnosis not present

## 2022-01-23 DIAGNOSIS — N179 Acute kidney failure, unspecified: Secondary | ICD-10-CM | POA: Diagnosis not present

## 2022-01-23 LAB — GLUCOSE, CAPILLARY
Glucose-Capillary: 150 mg/dL — ABNORMAL HIGH (ref 70–99)
Glucose-Capillary: 144 mg/dL — ABNORMAL HIGH (ref 70–99)
Glucose-Capillary: 183 mg/dL — ABNORMAL HIGH (ref 70–99)
Glucose-Capillary: 177 mg/dL — ABNORMAL HIGH (ref 70–99)

## 2022-01-23 LAB — BASIC METABOLIC PANEL
Anion gap: 3 — ABNORMAL LOW (ref 5–15)
BUN: 71 mg/dL — ABNORMAL HIGH (ref 8–23)
CO2: 13 mmol/L — ABNORMAL LOW (ref 22–32)
Calcium: 7.9 mg/dL — ABNORMAL LOW (ref 8.9–10.3)
Chloride: 123 mmol/L — ABNORMAL HIGH (ref 98–111)
Creatinine, Ser: 2.07 mg/dL — ABNORMAL HIGH (ref 0.44–1.00)
GFR, Estimated: 24 mL/min — ABNORMAL LOW (ref >60)
Glucose, Bld: 151 mg/dL — ABNORMAL HIGH (ref 70–99)
Potassium: 4.7 mmol/L (ref 3.5–5.1)
Sodium: 139 mmol/L (ref 135–145)

## 2022-01-23 MED ORDER — SODIUM BICARBONATE 8.4 % IV SOLN
INTRAVENOUS | Status: DC
  Filled 2022-01-23: qty 150
  Filled 2022-01-23 (×2): qty 1000

## 2022-01-23 NOTE — Progress Notes (Signed)
[facility]  ROUNDING NOTE   Subjective:   Patient seen resting quietly, alert and oriented Daughter at bedside Daughter states appetite remains decreased however has improved since ED arrival No lower extremity edema Denies nausea and vomiting  Objective:  Vital signs in last 24 hours:  Temp:  [97.7 F (36.5 C)-98.6 F (37 C)] 98.4 F (36.9 C) (12/31 0850) Pulse Rate:  [57-61] 61 (12/31 0850) Resp:  [15-18] 18 (12/31 0850) BP: (149-185)/(44-74) 149/74 (12/31 0850) SpO2:  [95 %-99 %] 95 % (12/31 0850)  Weight change:  Filed Weights   01/21/22 1840  Weight: 63.5 kg    Intake/Output: I/O last 3 completed shifts: In: 999.6 [I.V.:999.6] Out: -    Intake/Output this shift:  No intake/output data recorded.  Physical Exam: General: NAD  Head: Normocephalic, atraumatic. Moist oral mucosal membranes  Eyes: Anicteric  Lungs:  Clear to auscultation, normal effort, room air  Heart: Regular rate and rhythm  Abdomen:  Soft, nontender,   Extremities: No peripheral edema.  Neurologic: Alert and oriented, moving all four extremities  Skin: No lesions  Access: None    Basic Metabolic Panel: Recent Labs  Lab 01/21/22 1856 01/22/22 0450 01/22/22 1050 01/23/22 0531  NA 133* 134*  --  139  K 5.8* 5.4* 4.7 4.7  CL 108 114*  --  123*  CO2 16* 13*  --  13*  GLUCOSE 252* 138*  --  151*  BUN 106* 93*  --  71*  CREATININE 3.61* 2.88*  --  2.07*  CALCIUM 8.3* 8.3*  --  7.9*  MG 2.7*  --   --   --     Liver Function Tests: Recent Labs  Lab 01/21/22 1856  AST 22  ALT 22  ALKPHOS 47  BILITOT 0.4  PROT 6.8  ALBUMIN 3.5   Recent Labs  Lab 01/21/22 1856  LIPASE 54*   No results for input(s): "AMMONIA" in the last 168 hours.  CBC: Recent Labs  Lab 01/21/22 1856 01/22/22 0450  WBC 7.1 7.1  HGB 10.4* 10.4*  HCT 31.3* 31.3*  MCV 85.1 85.5  PLT 177 162    Cardiac Enzymes: No results for input(s): "CKTOTAL", "CKMB", "CKMBINDEX", "TROPONINI" in  the last 168 hours.  BNP: Invalid input(s): "POCBNP"  CBG: Recent Labs  Lab 01/22/22 1216 01/22/22 1639 01/22/22 2100 01/23/22 0845 01/23/22 1201  GLUCAP 145* 179* 180* 150* 144*    Microbiology: Results for orders placed or performed during the hospital encounter of 01/22/22  Resp panel by RT-PCR (RSV, Flu A&B, Covid) Anterior Nasal Swab     Status: None   Collection Time: 01/22/22 12:35 AM   Specimen: Anterior Nasal Swab  Result Value Ref Range Status   SARS Coronavirus 2 by RT PCR NEGATIVE NEGATIVE Final    Comment: (NOTE) SARS-CoV-2 target nucleic acids are NOT DETECTED.  The SARS-CoV-2 RNA is generally detectable in upper respiratory specimens during the acute phase of infection. The lowest concentration of SARS-CoV-2 viral copies this assay can detect is 138 copies/mL. A negative result does not preclude SARS-Cov-2 infection and should not be used as the sole basis for treatment or other patient management decisions. A negative result may occur with  improper specimen collection/handling, submission of specimen other than nasopharyngeal swab, presence of viral mutation(s) within the areas targeted by this assay, and inadequate number of viral copies(<138 copies/mL). A negative result must be combined with clinical observations, patient history, and epidemiological information. The expected result is Negative.  Fact Sheet  for Patients:  BloggerCourse.com  Fact Sheet for Healthcare Providers:  SeriousBroker.it  This test is no t yet approved or cleared by the [place] FDA and  has been authorized for detection and/or diagnosis of SARS-CoV-2 by FDA under an Emergency Use Authorization (EUA). This EUA will remain  in effect (meaning this test can be used) for the duration of the COVID-19 declaration under Section 564(b)(1) of the Act, 21 U.S.C.section 360bbb-3(b)(1), unless the authorization is terminated  or  revoked sooner.       Influenza A by PCR NEGATIVE NEGATIVE Final   Influenza B by PCR NEGATIVE NEGATIVE Final    Comment: (NOTE) The Xpert Xpress SARS-CoV-2/FLU/RSV plus assay is intended as an aid in the diagnosis of influenza from Nasopharyngeal swab specimens and should not be used as a sole basis for treatment. Nasal washings and aspirates are unacceptable for Xpert Xpress SARS-CoV-2/FLU/RSV testing.  Fact Sheet for Patients: BloggerCourse.com  Fact Sheet for Healthcare Providers: SeriousBroker.it  This test is not yet approved or cleared by the [place] FDA and has been authorized for detection and/or diagnosis of SARS-CoV-2 by FDA under an Emergency Use Authorization (EUA). This EUA will remain in effect (meaning this test can be used) for the duration of the COVID-19 declaration under Section 564(b)(1) of the Act, 21 U.S.C. section 360bbb-3(b)(1), unless the authorization is terminated or revoked.     Resp Syncytial Virus by PCR NEGATIVE NEGATIVE Final    Comment: (NOTE) Fact Sheet for Patients: BloggerCourse.com  Fact Sheet for Healthcare Providers: SeriousBroker.it  This test is not yet approved or cleared by the [place] FDA and has been authorized for detection and/or diagnosis of SARS-CoV-2 by FDA under an Emergency Use Authorization (EUA). This EUA will remain in effect (meaning this test can be used) for the duration of the COVID-19 declaration under Section 564(b)(1) of the Act, 21 U.S.C. section 360bbb-3(b)(1), unless the authorization is terminated or revoked.  Performed at [facility], [address] [place], [place] [address]     Coagulation Studies: No results for input(s): "LABPROT", "INR" in the last 72 hours.  Urinalysis: Recent Labs    01/22/22 0200  COLORURINE YELLOW*  LABSPEC 1.011  PHURINE 5.0  GLUCOSEU NEGATIVE   HGBUR NEGATIVE  BILIRUBINUR NEGATIVE  KETONESUR NEGATIVE  PROTEINUR NEGATIVE  NITRITE NEGATIVE  LEUKOCYTESUR SMALL*      Imaging: US RENAL  Result Date: 01/22/2022 CLINICAL DATA:  Acute renal failure. EXAM: RENAL / URINARY TRACT ULTRASOUND COMPLETE COMPARISON:  Renal ultrasound 03/30/2014; CT abdomen and pelvis without contrast 03/19/2014 FINDINGS: Right Kidney: Renal measurements: 10.1 x 4.9 x 4.9 cm = volume: 129 mL. Increased cortical echogenicity. The cortical thickness measures 0.8 cm. Within the mid to upper pole there is a partially exophytic anechoic benign simple cyst measuring 7 x 6 x 6 mm. There are some nonshadowing echogenic foci within the parapelvic fat measuring up to 6 mm, 5 mm, and 6 mm that are favored represent foci of fat. Nonobstructing stones are felt less likely. No hydronephrosis. Left Kidney: Renal measurements: 10.2 x 5.1 x 4.8 cm = volume: 130 mL. Increased cortical echogenicity. The cortical thickness measures 1.1 cm. Avascular benign parapelvic simple cysts are seen measuring up to 1.2 cm within the mid to upper pole and 0.9 cm within the midpole. Bladder: Appears normal for degree of bladder distention. Other: None. IMPRESSION: 1. Increased cortical echogenicity of the bilateral kidneys suggestive of medical renal disease. 2. No hydronephrosis. 3. There are some nonshadowing echogenic  foci within the parapelvic fat measuring up to 6 mm, 5 mm, and 6 mm that are favored represent normal focal fat. Nonobstructing stones are possible but felt less likely. 4. Bilateral benign simple renal cysts. No follow-up imaging is recommended. Electronically Signed   By: Neita Garnet M.D.   On: 01/22/2022 11:57   DG Chest 2 View  Result Date: 01/22/2022 CLINICAL DATA:  Weakness EXAM: CHEST - 2 VIEW COMPARISON:  09/12/2019 FINDINGS: The heart size and mediastinal contours are within normal limits. Both lungs are clear. The visualized skeletal structures are unremarkable. IMPRESSION:  No active cardiopulmonary disease. Electronically Signed   By: Deatra Robinson M.D.   On: 01/22/2022 01:08     Medications:    sodium bicarbonate 150 mEq in dextrose 5 % 1,150 mL infusion      atorvastatin  40 mg Oral Daily   clopidogrel  75 mg Oral Daily   heparin injection (subcutaneous)  5,000 Units Subcutaneous Q8H   insulin aspart  0-5 Units Subcutaneous QHS   insulin aspart  0-9 Units Subcutaneous TID WC   metoprolol succinate  200 mg Oral Daily   acetaminophen **OR** acetaminophen, albuterol, benzonatate, hydrALAZINE, magnesium hydroxide, ondansetron **OR** ondansetron (ZOFRAN) IV, traZODone  Assessment/ Plan:  [name] is a 81 y.o.  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  Baseline creatinine appears to be 1.55 with GFR 34 on 08/06/2021.  Acute kidney injury appears secondary to hypovolemia and decreased oral intake.  Chronic kidney disease likely secondary to hypertension and diabetes.  Renal ultrasound shows nonobstructive renal cyst.    Creatinine has improved with IV fluids.  No urine output recorded.  Patient states she is voiding well.  Will continue fluids for now.  No acute need for dialysis.  Lab Results  Component Value Date   CREATININE 2.07 (H) 01/23/2022   CREATININE 2.88 (H) 01/22/2022   CREATININE 3.61 (H) 01/21/2022    Intake/Output Summary (Last 24 hours) at 01/23/2022 1204 Last data filed at 01/23/2022 0647 Gross per 24 hour  Intake 999.57 ml  Output --  Net 999.57 ml   2. Anemia of chronic kidney disease  Lab Results  Component Value Date   HGB 10.4 (L) 01/22/2022   Hemoglobin above desired target.  Will continue to monitor.  3.  Diabetes mellitus type II with chronic kidney disease/renal manifestations: noninsulin dependent. Home regimen includes metformin and glipizide.  Glucose well-controlled with scheduled sliding scale insulin. . 4. Hypertension with chronic kidney disease.  Currently with hydrochlorothiazide and metoprolol outpatient.  Only receiving metoprolol.  Blood pressure acceptable for this patient.  5.  Acute metabolic acidosis.  Serum bicarb 13 today.  Will change IV fluids to sodium bicarb at 75 mL/h.    LOS: 1 Symphani Eckstrom 12/31/202312:04 PM

## 2022-01-23 NOTE — Evaluation (Signed)
Physical Therapy Evaluation Patient Details Name: Gina Kelly MRN: [id number] DOB: 01/17/1941 Today's Date: 01/23/2022  History of Present Illness  81 y.o. female with past medical history significant for type 2 diabetes mellitus, CKD stage IIIb/IV, essential hypertension, hypercholesterolemia, history of chronic diarrhea, Hx CVA who presented to [facility] ED on 12/29 with acute onset dizziness, diminished oral intake, recent diarrhea.  Patient was seen in urgent care for UTI recently on 12/22 and prescribed Bactrim DS in which she took for 4 days and then was advised to discontinue due to worsening renal function.  Clinical Impression  Pt did well with multiple loops of in-room ambulation and showed good overall confidence and safety with mobility, etc.  She uses 4WW most of the time at home, discussed pros and cons of 4WW vs FWW and appropriate settings and strategies.  Pt is safe to return home with family when medically cleared, recommending continued PT here and HHPT at home to address functional limitations and facilitate return to [facility].      Recommendations for follow up therapy are one component of a multi-disciplinary discharge planning process, led by the attending physician.  Recommendations may be updated based on patient status, additional functional criteria and insurance authorization.  Follow Up Recommendations Home health PT      Assistance Recommended at Discharge PRN  Patient can return home with the following  Assist for transportation;Assistance with cooking/housework    Equipment Recommendations None recommended by PT  Recommendations for Other Services       Functional Status Assessment Patient has had a recent decline in their functional status and demonstrates the ability to make significant improvements in function in a reasonable and predictable amount of time.     Precautions / Restrictions Precautions Precautions: Fall Restrictions Weight Bearing Restrictions:  No      Mobility  Bed Mobility Overal bed mobility: Modified Independent             General bed mobility comments: Pt was able to rise to sitting EOB w/o direct assist    Transfers Overall transfer level: Modified independent Equipment used: Rolling walker (2 wheels)               General transfer comment: Pt able to easily get to standing with appropriate UE use and weight shift    Ambulation/Gait Ambulation/Gait assistance: Min guard Gait Distance (Feet): 105 Feet Assistive device: Rolling walker (2 wheels)         General Gait Details: Multiple loops in the room with walker.  Pt able to manage walker, maintain appropriate vitals, showed no LOBs or overt safety issues and reported only minimal fatigue.  Stairs            Wheelchair Mobility    Modified Rankin (Stroke Patients Only)       Balance Overall balance assessment: No apparent balance deficits (not formally assessed)                                           Pertinent Vitals/Pain Pain Assessment Pain Assessment: No/denies pain    Home Living Family/patient expects to be discharged to:: Private residence Living Arrangements: Children Available Help at Discharge: Available 24 hours/day   Home Access: Ramped entrance         Home Equipment: Agricultural consultant (2 wheels);Rollator (4 wheels)      Prior Function Prior Level of Function :  Independent/Modified Independent             Mobility Comments: Pt able to get up move around well in the home, out of the house with some regularity       Hand Dominance        Extremity/Trunk Assessment   Upper Extremity Assessment Upper Extremity Assessment: Overall WFL for tasks assessed;Generalized weakness    Lower Extremity Assessment Lower Extremity Assessment: Overall WFL for tasks assessed;Generalized weakness       Communication   Communication: No difficulties  Cognition Arousal/Alertness:  Awake/alert Behavior During Therapy: WFL for tasks assessed/performed Overall Cognitive Status: History of cognitive impairments - at baseline                                          General Comments General comments (skin integrity, edema, etc.): Pt is weaker and more limited than her baseline, but ultimately moved quite well in the room and showed appropriate safety with "prolonged" ambulation using walker    Exercises     Assessment/Plan    PT Assessment Patient needs continued PT services  PT Problem List Decreased strength;Decreased range of motion;Decreased activity tolerance;Decreased balance;Decreased mobility;Decreased coordination;Decreased safety awareness;Decreased cognition;Cardiopulmonary status limiting activity       PT Treatment Interventions DME instruction;Gait training;Functional mobility training;Therapeutic activities;Therapeutic exercise;Balance training;Patient/family education    PT Goals (Current goals can be found in the Care Plan section)  Acute Rehab PT Goals Patient Stated Goal: go home PT Goal Formulation: With patient Time For Goal Achievement: 02/04/22 Potential to Achieve Goals: Good    Frequency Min 2X/week     Co-evaluation               AM-PAC PT "6 Clicks" Mobility  Outcome Measure Help needed turning from your back to your side while in a flat bed without using bedrails?: None Help needed moving from lying on your back to sitting on the side of a flat bed without using bedrails?: None Help needed moving to and from a bed to a chair (including a wheelchair)?: None Help needed standing up from a chair using your arms (e.g., wheelchair or bedside chair)?: A Little Help needed to walk in hospital room?: A Little Help needed climbing 3-5 steps with a railing? : A Little 6 Click Score: 21    End of Session   Activity Tolerance: Patient tolerated treatment well Patient left: with chair alarm set;with call  bell/phone within reach;with family/visitor present Nurse Communication: Mobility status PT Visit Diagnosis: Muscle weakness (generalized) (M62.81);Difficulty in walking, not elsewhere classified (R26.2)    Time: 5093-2671 PT Time Calculation (min) (ACUTE ONLY): 18 min   Charges:   PT Evaluation $PT Eval Low Complexity: 1 Low PT Treatments $Gait Training: 8-22 mins        Malachi Pro, DPT 01/23/2022, 10:07 AM

## 2022-01-23 NOTE — Evaluation (Signed)
Occupational Therapy Evaluation Patient Details Name: Gina Kelly MRN: [id number] DOB: 08-14-1940 Today's Date: 01/23/2022   History of Present Illness 81 y.o. female with past medical history significant for type 2 diabetes mellitus, CKD stage IIIb/IV, essential hypertension, hypercholesterolemia, history of chronic diarrhea, Hx CVA who presented to [facility] ED on 12/29 with acute onset dizziness, diminished oral intake, recent diarrhea.  Patient was seen in urgent care for UTI recently on 12/22 and prescribed Bactrim DS in which she took for 4 days and then was advised to discontinue due to worsening renal function.   Clinical Impression   [name] is able to perform bed mobility, transfers, ambulation, toileting, w/ RW and Mod I, no LOB. She reports feeling at or near her baseline level of fxl mobility. She lives with 4 generations of family in a single-story house, is largely IND in ADL, although her daughter assists her with bathing. Family members assist with transportation, shopping, cooking and cleaning. Pt reports that she is eager to do more around her home, e.g., dishwashing, folding clothes. Provided educ to daughter re: importance of pt engaging in regular mobility, assisting with household tasks as pt is able to do safely. Pt denies falls hx, ambulates w/ RW at home. No additional OT services required at this time. Pt and daughter in agreement.     Recommendations for follow up therapy are one component of a multi-disciplinary discharge planning process, led by the attending physician.  Recommendations may be updated based on patient status, additional functional criteria and insurance authorization.   Follow Up Recommendations  No OT follow up     Assistance Recommended at Discharge PRN  Patient can return home with the following A little help with bathing/dressing/bathroom;Assistance with cooking/housework;Assist for transportation    Functional Status Assessment  Patient has  had a recent decline in their functional status and demonstrates the ability to make significant improvements in function in a reasonable and predictable amount of time.  Equipment Recommendations  None recommended by OT    Recommendations for Other Services       Precautions / Restrictions Precautions Precautions: Fall Precaution Comments: Mod fall risk Restrictions Weight Bearing Restrictions: No      Mobility Bed Mobility Overal bed mobility: Modified Independent                  Transfers Overall transfer level: Modified independent Equipment used: Rolling walker (2 wheels)               General transfer comment: Able to come into standing w/ RW smoothly and quickly, no assistance needed      Balance Overall balance assessment: No apparent balance deficits (not formally assessed)                                         ADL either performed or assessed with clinical judgement   ADL Overall ADL's : At baseline                                             Vision         Perception     Praxis      Pertinent Vitals/Pain Pain Assessment Pain Assessment: No/denies pain     Hand Dominance     Extremity/Trunk Assessment  Upper Extremity Assessment Upper Extremity Assessment: Overall WFL for tasks assessed   Lower Extremity Assessment Lower Extremity Assessment: Overall WFL for tasks assessed   Cervical / Trunk Assessment Cervical / Trunk Assessment: Normal   Communication Communication Communication: No difficulties   Cognition Arousal/Alertness: Awake/alert Behavior During Therapy: WFL for tasks assessed/performed Overall Cognitive Status: Within Functional Limits for tasks assessed                                       General Comments  Pt is weaker and more limited than her baseline, but ultimately moved quite well in the room and showed appropriate safety with "prolonged" ambulation  using walker    Exercises Other Exercises Other Exercises: Educ re: increased participation in housework   Shoulder Instructions      Home Living Family/patient expects to be discharged to:: Private residence Living Arrangements: Children Available Help at Discharge: Available PRN/intermittently Type of Home: Mobile home Home Access: Ramped entrance     Home Layout: One level     Bathroom Shower/Tub: Producer, television/film/video: Standard     Home Equipment: Pharmacist, hospital (2 wheels);Rollator (4 wheels)          Prior Functioning/Environment Prior Level of Function : Independent/Modified Independent             Mobility Comments: Walks with RW or 4WW, reports no falls history ADLs Comments: Daughter and granddaughter help pt with showering. Pt performs other BADL INDly. Does not drive but rides with daughter to grocery store and other community outings.        OT Problem List: Decreased activity tolerance;Decreased strength      OT Treatment/Interventions:      OT Goals(Current goals can be found in the care plan section) Acute Rehab OT Goals Patient Stated Goal: to do more around the house OT Goal Formulation: With patient Time For Goal Achievement: 02/06/22 Potential to Achieve Goals: Good  OT Frequency:      Co-evaluation              AM-PAC OT "6 Clicks" Daily Activity     Outcome Measure Help from another person eating meals?: None Help from another person taking care of personal grooming?: None Help from another person toileting, which includes using toliet, bedpan, or urinal?: None Help from another person bathing (including washing, rinsing, drying)?: A Little Help from another person to put on and taking off regular upper body clothing?: None Help from another person to put on and taking off regular lower body clothing?: None 6 Click Score: 23   End of Session Equipment Utilized During Treatment: Rolling walker (2  wheels)  Activity Tolerance: Patient tolerated treatment well Patient left: in chair;with call bell/phone within reach;with chair alarm set;with family/visitor present  OT Visit Diagnosis: Muscle weakness (generalized) (M62.81)                Time: 1696-7893 OT Time Calculation (min): 17 min Charges:  OT General Charges $OT Visit: 1 Visit OT Evaluation $OT Eval Low Complexity: 1 Low OT Treatments $Self Care/Home Management : 8-22 mins Latina Craver, PhD, MS, OTR/L 01/23/22, 1:15 PM

## 2022-01-23 NOTE — Progress Notes (Signed)
PROGRESS NOTE    Thara Searing  KDT:[id number] DOB: 1940/04/21 DOA: 01/22/2022 PCP: [facility]    Brief Narrative:   Gina Kelly is a 81 y.o. female with past medical history significant for type 2 diabetes mellitus, CKD stage IIIb/IV, essential hypertension, hypercholesterolemia, history of chronic diarrhea, Hx CVA who presented to [facility] ED on 12/29 with acute onset dizziness, diminished oral intake, recent diarrhea.  Patient was seen in urgent care for UTI recently on 12/22 and prescribed Bactrim DS in which she took for 4 days and then was advised to discontinue due to worsening renal function.  Patient reports chills without fever.  Denies chest pain, no palpitations, no cough/congestion, no wheezing, no nausea/vomiting, no abdominal pain, no current urinary symptoms.  In the ED, temperature 7.8 F, HR 54, RR 16, BP 125/49, SpO2 98% on room air.  Sodium 133, potassium 5.8, chloride 108, CO2 16, glucose 252, BUN 106, creatinine 3.61.  Lipase 54.  AST 22, ALT 22, total bilirubin 0.4.  WBC 7.1, hemoglobin 10.4, platelets 177.  Cova-19 PCR negative.  Influenza A/B PCR negative.  Urinalysis with small leukocytes, negative nitrite, rare bacteria, 6-10 WBCs.  EKG with sinus bradycardia, rate 53 with low voltage QRS and anteroseptal Q waves.  X-ray chest with no acute cardiopulmonary disease process.  Patient was given 1 L bolus IV LR, 500 mL IV normal saline and 8.4 mg p.o. [place].  TRH consulted for admission for further evaluation management of acute renal failure on CKD stage IIIb and hyperkalemia.  Assessment & Plan:   Acute renal failure on CKD stage IIIb Baseline creatinine 1.4-1.6 with GFR 35.  Creatinine notably elevated to 3.61 on 12/29 with a BUN of 106.  Patient with reported acute on chronic diarrhea also with con commitment Bactrim/HCTZ use is likely confounding factor.  Renal ultrasound with increased cortical echogenicity with no hydronephrosis likely secondary to  chronic medical renal disease. -- Nephrology following, appreciate assistance -- Cr 3.61>2.88>2.07 (1.4 -1.6 baseline) -- Sodium bicarbonates at 75 mL/h -- Hold home HCTZ -- Avoid nephrotoxins, renal dose all medications -- Strict I's and O's -- Repeat BMP in a.m.  Hyperkalemia: Resolved Potassium 5.8 on admission, likely secondary to acute renal failure.  Received Patiromer 8.6g oral x1. -- K 5.8>5.4>4.7>4.7 -- Repeat BMP in a.m.  Acute on chronic diarrhea Patient with history of intermittent diarrhea, acutely worsened recently per family.  Recent use of antibiotics with Bactrim.  Patient has not had a bowel movement since the hospitalization and patient remains afebrile without leukocytosis.  Will discontinue C. difficile/GI PCR panel and enteric precautions. -- Supportive care  Essential hypertension -- Metoprolol succinate 200 mg p.o. daily -- Holding home HCTZ due to renal failure as above  Hypercholesterolemia -- Atorvastatin 20 mg p.o. daily  Type 2 diabetes mellitus Hemoglobin A1c 6.4 10/21/2021, well-controlled.  Home regimen includes metformin 500 mg p.o. twice daily, and glipizide 5 mg p.o. daily. -- Hold oral hypoglycemics while inpatient -- SSI for coverage -- CBGs qAC/HS  Hx CVA -- Plavix 75 mg p.o. daily  Weakness/debility/deconditioning: Family reports recent falls at home.  Lives with daughter.  PT recommending home health. -- OT evaluation: Pending   DVT prophylaxis: heparin injection 5,000 Units Start: 01/22/22 0600    Code Status: Full Code Family Communication: Updated 2 daughters present at bedside this morning  Disposition Plan:  Level of care: Telemetry Medical Status is: Inpatient Remains inpatient appropriate because: IV fluid hydration, needs improvement in renal function before ready for  discharge home, anticipate 1-2 days    Consultants:  Nephrology, Dr. Cherylann Ratel  Procedures:  None  Antimicrobials:  None   Subjective: Patient seen  examined bedside, resting comfortably.  Lying in bed.  Daughter present at bedside.  No specific complaints this morning.  Creatinine improved to 2.07.  Reports good urine output, unfortunately no output has been reported by nursing staff overnight.  Nephrology changing IV fluids to sodium bicarbonate drip today.  Patient denies any bowel movements or diarrhea since hospitalization.  No other questions or concerns at this time.  Patient denies headache, no fever/chills/night sweats, no nausea/vomiting, no chest pain, palpitations, no shortness of breath, no abdominal pain, no focal weakness, no fatigue, no paresthesias.  No acute events overnight per nursing staff.  Objective: Vitals:   01/22/22 1624 01/22/22 2030 01/22/22 2134 01/23/22 0850  BP: (!) 185/48 (!) 163/53 (!) 183/57 (!) 149/74  Pulse: (!) 58 (!) 58 (!) 57 61  Resp: 15 16 17 18   Temp: 97.7 F (36.5 C) 98.6 F (37 C) 98.6 F (37 C) 98.4 F (36.9 C)  TempSrc:    Oral  SpO2: 99% 99% 98% 95%  Weight:      Height:        Intake/Output Summary (Last 24 hours) at 01/23/2022 1214 Last data filed at 01/23/2022 01/25/2022 Gross per 24 hour  Intake 999.57 ml  Output --  Net 999.57 ml   Filed Weights   01/21/22 1840  Weight: 63.5 kg    Examination:  Physical Exam: GEN: NAD, alert and oriented x 3, chronically ill in appearance HEENT: NCAT, PERRL, EOMI, sclera clear, MMM PULM: CTAB w/o wheezes/crackles, normal respiratory effort, on room air CV: RRR w/o M/G/R GI: abd soft, NTND, NABS, no R/G/M MSK: no peripheral edema, muscle strength globally intact 5/5 bilateral upper/lower extremities NEURO: CN II-XII intact, no focal deficits, moves all independently Integumentary: dry/intact, no rashes or wounds    Data Reviewed: I have personally reviewed following labs and imaging studies  CBC: Recent Labs  Lab 01/21/22 1856 01/22/22 0450  WBC 7.1 7.1  HGB 10.4* 10.4*  HCT 31.3* 31.3*  MCV 85.1 85.5  PLT 177 162   Basic  Metabolic Panel: Recent Labs  Lab 01/21/22 1856 01/22/22 0450 01/22/22 1050 01/23/22 0531  NA 133* 134*  --  139  K 5.8* 5.4* 4.7 4.7  CL 108 114*  --  123*  CO2 16* 13*  --  13*  GLUCOSE 252* 138*  --  151*  BUN 106* 93*  --  71*  CREATININE 3.61* 2.88*  --  2.07*  CALCIUM 8.3* 8.3*  --  7.9*  MG 2.7*  --   --   --    GFR: Estimated Creatinine Clearance: 19 mL/min (A) (by C-G formula based on SCr of 2.07 mg/dL (H)). Liver Function Tests: Recent Labs  Lab 01/21/22 1856  AST 22  ALT 22  ALKPHOS 47  BILITOT 0.4  PROT 6.8  ALBUMIN 3.5   Recent Labs  Lab 01/21/22 1856  LIPASE 54*   No results for input(s): "AMMONIA" in the last 168 hours. Coagulation Profile: No results for input(s): "INR", "PROTIME" in the last 168 hours. Cardiac Enzymes: No results for input(s): "CKTOTAL", "CKMB", "CKMBINDEX", "TROPONINI" in the last 168 hours. BNP (last 3 results) No results for input(s): "PROBNP" in the last 8760 hours. HbA1C: No results for input(s): "HGBA1C" in the last 72 hours. CBG: Recent Labs  Lab 01/22/22 1216 01/22/22 1639 01/22/22 2100 01/23/22 0845 01/23/22  1201  GLUCAP 145* 179* 180* 150* 144*   Lipid Profile: No results for input(s): "CHOL", "HDL", "LDLCALC", "TRIG", "CHOLHDL", "LDLDIRECT" in the last 72 hours. Thyroid Function Tests: No results for input(s): "TSH", "T4TOTAL", "FREET4", "T3FREE", "THYROIDAB" in the last 72 hours. Anemia Panel: No results for input(s): "VITAMINB12", "FOLATE", "FERRITIN", "TIBC", "IRON", "RETICCTPCT" in the last 72 hours. Sepsis Labs: No results for input(s): "PROCALCITON", "LATICACIDVEN" in the last 168 hours.  Recent Results (from the past 240 hour(s))  Urine Culture     Status: Abnormal   Collection Time: 01/14/22  5:18 PM   Specimen: Urine, Clean Catch  Result Value Ref Range Status   Specimen Description URINE, CLEAN CATCH  Final   Special Requests   Final    NONE Performed at [facility], [address] [place], [place] [address]    Culture >=100,000 COLONIES/mL ESCHERICHIA COLI (A)  Final   Report Status 01/17/2022 FINAL  Final   Organism ID, Bacteria ESCHERICHIA COLI (A)  Final      Susceptibility   Escherichia coli - MIC*    AMPICILLIN 8 SENSITIVE Sensitive     CEFAZOLIN <=4 SENSITIVE Sensitive     CEFEPIME <=0.12 SENSITIVE Sensitive     CEFTRIAXONE <=0.25 SENSITIVE Sensitive     CIPROFLOXACIN <=0.25 SENSITIVE Sensitive     GENTAMICIN <=1 SENSITIVE Sensitive     IMIPENEM <=0.25 SENSITIVE Sensitive     NITROFURANTOIN <=16 SENSITIVE Sensitive     TRIMETH/SULFA <=20 SENSITIVE Sensitive     AMPICILLIN/SULBACTAM 4 SENSITIVE Sensitive     PIP/TAZO <=4 SENSITIVE Sensitive     * >=100,000 COLONIES/mL ESCHERICHIA COLI  Resp panel by RT-PCR (RSV, Flu A&B, Covid) Anterior Nasal Swab     Status: None   Collection Time: 01/22/22 12:35 AM   Specimen: Anterior Nasal Swab  Result Value Ref Range Status   SARS Coronavirus 2 by RT PCR NEGATIVE NEGATIVE Final    Comment: (NOTE) SARS-CoV-2 target nucleic acids are NOT DETECTED.  The SARS-CoV-2 RNA is generally detectable in upper respiratory specimens during the acute phase of infection. The lowest concentration of SARS-CoV-2 viral copies this assay can detect is 138 copies/mL. A negative result does not preclude SARS-Cov-2 infection and should not be used as the sole basis for treatment or other patient management decisions. A negative result may occur with  improper specimen collection/handling, submission of specimen other than nasopharyngeal swab, presence of viral mutation(s) within the areas targeted by this assay, and inadequate number of viral copies(<138 copies/mL). A negative result must be combined with clinical observations, patient history, and epidemiological information. The expected result is Negative.  Fact Sheet for Patients:  BloggerCourse.com  Fact Sheet for Healthcare Providers:   SeriousBroker.it  This test is no t yet approved or cleared by the [place] FDA and  has been authorized for detection and/or diagnosis of SARS-CoV-2 by FDA under an Emergency Use Authorization (EUA). This EUA will remain  in effect (meaning this test can be used) for the duration of the COVID-19 declaration under Section 564(b)(1) of the Act, 21 U.S.C.section 360bbb-3(b)(1), unless the authorization is terminated  or revoked sooner.       Influenza A by PCR NEGATIVE NEGATIVE Final   Influenza B by PCR NEGATIVE NEGATIVE Final    Comment: (NOTE) The Xpert Xpress SARS-CoV-2/FLU/RSV plus assay is intended as an aid in the diagnosis of influenza from Nasopharyngeal swab specimens and should not be used as a sole basis for treatment. Nasal washings and  aspirates are unacceptable for Xpert Xpress SARS-CoV-2/FLU/RSV testing.  Fact Sheet for Patients: BloggerCourse.comhttps://www.fda.gov/media/152166/download  Fact Sheet for Healthcare Providers: SeriousBroker.ithttps://www.fda.gov/media/152162/download  This test is not yet approved or cleared by the [place] FDA and has been authorized for detection and/or diagnosis of SARS-CoV-2 by FDA under an Emergency Use Authorization (EUA). This EUA will remain in effect (meaning this test can be used) for the duration of the COVID-19 declaration under Section 564(b)(1) of the Act, 21 U.S.C. section 360bbb-3(b)(1), unless the authorization is terminated or revoked.     Resp Syncytial Virus by PCR NEGATIVE NEGATIVE Final    Comment: (NOTE) Fact Sheet for Patients: BloggerCourse.comhttps://www.fda.gov/media/152166/download  Fact Sheet for Healthcare Providers: SeriousBroker.ithttps://www.fda.gov/media/152162/download  This test is not yet approved or cleared by the [place] FDA and has been authorized for detection and/or diagnosis of SARS-CoV-2 by FDA under an Emergency Use Authorization (EUA). This EUA will remain in effect (meaning this test can be used) for  the duration of the COVID-19 declaration under Section 564(b)(1) of the Act, 21 U.S.C. section 360bbb-3(b)(1), unless the authorization is terminated or revoked.  Performed at [facility], [address] [place], [place] [address]          Radiology Studies: US RENAL  Result Date: 01/22/2022 CLINICAL DATA:  Acute renal failure. EXAM: RENAL / URINARY TRACT ULTRASOUND COMPLETE COMPARISON:  Renal ultrasound 03/30/2014; CT abdomen and pelvis without contrast 03/19/2014 FINDINGS: Right Kidney: Renal measurements: 10.1 x 4.9 x 4.9 cm = volume: 129 mL. Increased cortical echogenicity. The cortical thickness measures 0.8 cm. Within the mid to upper pole there is a partially exophytic anechoic benign simple cyst measuring 7 x 6 x 6 mm. There are some nonshadowing echogenic foci within the parapelvic fat measuring up to 6 mm, 5 mm, and 6 mm that are favored represent foci of fat. Nonobstructing stones are felt less likely. No hydronephrosis. Left Kidney: Renal measurements: 10.2 x 5.1 x 4.8 cm = volume: 130 mL. Increased cortical echogenicity. The cortical thickness measures 1.1 cm. Avascular benign parapelvic simple cysts are seen measuring up to 1.2 cm within the mid to upper pole and 0.9 cm within the midpole. Bladder: Appears normal for degree of bladder distention. Other: None. IMPRESSION: 1. Increased cortical echogenicity of the bilateral kidneys suggestive of medical renal disease. 2. No hydronephrosis. 3. There are some nonshadowing echogenic foci within the parapelvic fat measuring up to 6 mm, 5 mm, and 6 mm that are favored represent normal focal fat. Nonobstructing stones are possible but felt less likely. 4. Bilateral benign simple renal cysts. No follow-up imaging is recommended. Electronically Signed   By: Neita Garnetonald  Viola M.D.   On: 01/22/2022 11:57   DG Chest 2 View  Result Date: 01/22/2022 CLINICAL DATA:  Weakness EXAM: CHEST - 2 VIEW COMPARISON:  09/12/2019 FINDINGS: The  heart size and mediastinal contours are within normal limits. Both lungs are clear. The visualized skeletal structures are unremarkable. IMPRESSION: No active cardiopulmonary disease. Electronically Signed   By: Deatra RobinsonKevin  Herman M.D.   On: 01/22/2022 01:08        Scheduled Meds:  atorvastatin  40 mg Oral Daily   clopidogrel  75 mg Oral Daily   heparin injection (subcutaneous)  5,000 Units Subcutaneous Q8H   insulin aspart  0-5 Units Subcutaneous QHS   insulin aspart  0-9 Units Subcutaneous TID WC   metoprolol succinate  200 mg Oral Daily   Continuous Infusions:  sodium bicarbonate 150 mEq in dextrose 5 % 1,150 mL infusion  LOS: 1 day    Time spent: 48 minutes spent on chart review, discussion with nursing staff, consultants, updating family and interview/physical exam; more than 50% of that time was spent in counseling and/or coordination of care.    Alvira Philips [place], DO [facility] Available via Epic secure chat 7am-7pm After these hours, please refer to coverage provider listed on [email or web address] 01/23/2022, 12:14 PM

## 2022-01-23 NOTE — Plan of Care (Signed)

## 2022-01-23 NOTE — TOC Initial Note (Addendum)
Transition of Care [facility]) - Initial/Assessment Note    Patient Details  Name: Gina Kelly MRN: [id number] Date of Birth: Jun 25, 1940  [facility]) CM/SW Contact:    Gina Cline, LCSW Phone Number: 01/23/2022, 12:47 PM  Clinical Narrative:                 CSW completed assessment with patient and discussed PT recommendation for HHPT. Patient lives with her daughter Gina Kelly) who also provides transportation.  PCP is at [facility] office in [place], unsure of provider name. Pharmacy is [facility]. Patient has a RW and rollator at home.  Patient states she is agreeable to HHPT, confirmed home address. Patient denies agency preferences. Referral made to [facility] who is checking to confirm if they can accept the patient, he stated he will not know until tomorrow and will update RNCM assigned to patient tomorrow 1/1, TOC handoff updated.   Expected Discharge Plan: Home w Home Health Services Barriers to Discharge: Continued Medical Work up   Patient Goals and CMS Choice Patient states their goals for this hospitalization and ongoing recovery are:: home with home health CMS Medicare.gov Compare Post Acute Care list provided to:: Patient Choice offered to / list presented to : Patient      Expected Discharge Plan and Services       Living arrangements for the past 2 months: Single Family Home                           HH Arranged: PT          Prior Living Arrangements/Services Living arrangements for the past 2 months: Single Family Home Lives with:: Adult Children Patient language and need for interpreter reviewed:: Yes Do you feel safe going back to the place where you live?: Yes      Need for Family Participation in Patient Care: Yes (Comment) Care giver support system in place?: Yes (comment) Current home services: DME Criminal Activity/Legal Involvement Pertinent to Current Situation/Hospitalization: No - Comment as  needed  Activities of Daily Living Home Assistive Devices/Equipment: None ADL Screening (condition at time of admission) Patient's cognitive ability adequate to safely complete daily activities?: No Is the patient deaf or have difficulty hearing?: No Does the patient have difficulty seeing, even when wearing glasses/contacts?: No Does the patient have difficulty concentrating, remembering, or making decisions?: No Patient able to express need for assistance with ADLs?: No Does the patient have difficulty dressing or bathing?: No Independently performs ADLs?: Yes (appropriate for developmental age) Does the patient have difficulty walking or climbing stairs?: No Weakness of Legs: None Weakness of Arms/Hands: None  Permission Sought/Granted Permission sought to share information with : Gina Kelly granted to share information with : Yes, Verbal Permission Granted     Permission granted to share info w AGENCY: Home Health agencies  Permission granted to share info w Relationship: daughter Gina Kelly     Emotional Assessment       Orientation: : Oriented to Self, Oriented to Place, Oriented to  Time, Oriented to Situation Alcohol / Substance Use: Not Applicable Psych Involvement: No (comment)  Admission diagnosis:  Generalized weakness [R53.1] Acute renal failure, unspecified acute renal failure type (HCC) [N17.9] Acute kidney injury superimposed on chronic kidney disease (HCC) [N17.9, N18.9] Patient Active Problem List   Diagnosis Date Noted   Acute kidney injury superimposed on chronic kidney disease (HCC) 01/22/2022   Hyperkalemia 01/22/2022  Essential hypertension 01/22/2022   Type 2 diabetes mellitus with chronic kidney disease, without long-term current use of insulin ([place]) 01/22/2022   Dyslipidemia 01/22/2022   History of CVA (cerebrovascular accident) 01/22/2022   PCP:  Gina Kelly:   Gina Kelly, [place] [place] [address] Phone: [phone] Fax: [phone]  [id number] - Gina Kelly, [place] [address] [place] [address] Phone: [phone] Fax: [phone]     Social Determinants of Health (SDOH) Social History: Div[place]: No Food Insecurity (01/22/2022)  Housing: Low Risk  (01/22/2022)  Transportation Needs: No Transportation Needs (01/22/2022)  Utilities: Not At Risk (01/22/2022)  Tobacco Use: Low Risk  (01/21/2022)   SDOH Interventions:     Readmission Risk Interventions    01/23/2022   12:42 PM  Readmission Risk Prevention Plan  Transportation Screening Complete  PCP or Specialist Appt within 5-7 Days Complete  Home Care Screening Complete  Medication Review (RN CM) Complete

## 2022-01-24 ENCOUNTER — Inpatient Hospital Stay: Payer: Medicare Other

## 2022-01-24 DIAGNOSIS — N179 Acute kidney failure, unspecified: Secondary | ICD-10-CM | POA: Diagnosis not present

## 2022-01-24 DIAGNOSIS — N189 Chronic kidney disease, unspecified: Secondary | ICD-10-CM | POA: Diagnosis not present

## 2022-01-24 LAB — BLOOD GAS, ARTERIAL
Acid-base deficit: 6 mmol/L — ABNORMAL HIGH (ref 0.0–2.0)
Bicarbonate: 17.1 mmol/L — ABNORMAL LOW (ref 20.0–28.0)
O2 Content: 8 L/min
O2 Saturation: 93.9 %
Patient temperature: 37
pCO2 arterial: 27 mmHg — ABNORMAL LOW (ref 32–48)
pH, Arterial: 7.41 (ref 7.35–7.45)
pO2, Arterial: 63 mmHg — ABNORMAL LOW (ref 83–108)

## 2022-01-24 LAB — BASIC METABOLIC PANEL
Anion gap: 7 (ref 5–15)
BUN: 52 mg/dL — ABNORMAL HIGH (ref 8–23)
CO2: 17 mmol/L — ABNORMAL LOW (ref 22–32)
Calcium: 8.1 mg/dL — ABNORMAL LOW (ref 8.9–10.3)
Chloride: 114 mmol/L — ABNORMAL HIGH (ref 98–111)
Creatinine, Ser: 1.64 mg/dL — ABNORMAL HIGH (ref 0.44–1.00)
GFR, Estimated: 31 mL/min — ABNORMAL LOW (ref >60)
Glucose, Bld: 230 mg/dL — ABNORMAL HIGH (ref 70–99)
Potassium: 4.7 mmol/L (ref 3.5–5.1)
Sodium: 138 mmol/L (ref 135–145)

## 2022-01-24 LAB — GLUCOSE, CAPILLARY
Glucose-Capillary: 165 mg/dL — ABNORMAL HIGH (ref 70–99)
Glucose-Capillary: 147 mg/dL — ABNORMAL HIGH (ref 70–99)
Glucose-Capillary: 218 mg/dL — ABNORMAL HIGH (ref 70–99)
Glucose-Capillary: 276 mg/dL — ABNORMAL HIGH (ref 70–99)
Glucose-Capillary: 150 mg/dL — ABNORMAL HIGH (ref 70–99)

## 2022-01-24 MED ORDER — AMLODIPINE BESYLATE 5 MG PO TABS: 5.0000 mg | ORAL_TABLET | Freq: Every day | ORAL | Status: DC

## 2022-01-24 MED ORDER — HYDRALAZINE HCL 50 MG PO TABS
50.0000 mg | ORAL_TABLET | Freq: Four times a day (QID) | ORAL | Status: DC
  Administered 2022-01-24 (×3): 50 mg via ORAL
  Filled 2022-01-24 (×4): qty 1

## 2022-01-24 MED ORDER — FUROSEMIDE 10 MG/ML IJ SOLN
20.0000 mg | Freq: Once | INTRAMUSCULAR | Status: AC
  Administered 2022-01-24: 20 mg via INTRAVENOUS
  Filled 2022-01-24: qty 4

## 2022-01-24 MED ORDER — AMLODIPINE BESYLATE 10 MG PO TABS
10.0000 mg | ORAL_TABLET | Freq: Every day | ORAL | Status: DC
  Administered 2022-01-25 – 2022-01-26 (×2): 10 mg via ORAL
  Filled 2022-01-24 (×2): qty 1

## 2022-01-24 MED ORDER — CARVEDILOL 25 MG PO TABS
25.0000 mg | ORAL_TABLET | Freq: Two times a day (BID) | ORAL | Status: DC
  Administered 2022-01-24 – 2022-01-26 (×5): 25 mg via ORAL
  Filled 2022-01-24 (×5): qty 1

## 2022-01-24 MED ORDER — LEVOTHYROXINE SODIUM 50 MCG PO TABS
75.0000 ug | ORAL_TABLET | Freq: Every day | ORAL | Status: DC
  Administered 2022-01-24 – 2022-01-26 (×3): 75 ug via ORAL
  Filled 2022-01-24 (×3): qty 1

## 2022-01-24 MED ORDER — AMLODIPINE BESYLATE 5 MG PO TABS
5.0000 mg | ORAL_TABLET | Freq: Every day | ORAL | Status: DC
  Filled 2022-01-24 (×2): qty 1

## 2022-01-24 NOTE — Progress Notes (Signed)
PROGRESS NOTE    Gina Kelly  [id number] DOB: 06/28/1940 DOA: 01/22/2022 PCP: [place]    Brief Narrative:   Gina Kelly is a 82 y.o. female with past medical history significant for type 2 diabetes mellitus, CKD stage IIIb/IV, essential hypertension, hypercholesterolemia, history of chronic diarrhea, Hx CVA who presented to [facility] ED on 12/29 with acute onset dizziness, diminished oral intake, recent diarrhea.  Patient was seen in urgent care for UTI recently on 12/22 and prescribed Bactrim DS in which she took for 4 days and then was advised to discontinue due to worsening renal function.  Patient reports chills without fever.  Denies chest pain, no palpitations, no cough/congestion, no wheezing, no nausea/vomiting, no abdominal pain, no current urinary symptoms.  In the ED, temperature 7.8 F, HR 54, RR 16, BP 125/49, SpO2 98% on room air.  Sodium 133, potassium 5.8, chloride 108, CO2 16, glucose 252, BUN 106, creatinine 3.61.  Lipase 54.  AST 22, ALT 22, total bilirubin 0.4.  WBC 7.1, hemoglobin 10.4, platelets 177.  Cova-19 PCR negative.  Influenza A/B PCR negative.  Urinalysis with small leukocytes, negative nitrite, rare bacteria, 6-10 WBCs.  EKG with sinus bradycardia, rate 53 with low voltage QRS and anteroseptal Q waves.  X-ray chest with no acute cardiopulmonary disease process.  Patient was given 1 L bolus IV LR, 500 mL IV normal saline and 8.4 mg p.o. [place].  TRH consulted for admission for further evaluation management of acute renal failure on CKD stage IIIb and hyperkalemia.  Assessment & Plan:   Acute renal failure on CKD stage IIIb Baseline creatinine 1.4-1.6 with GFR 35.  Creatinine notably elevated to 3.61 on 12/29 with a BUN of 106.  Patient with reported acute on chronic diarrhea also with con commitment Bactrim/HCTZ use is likely confounding factor.  Renal ultrasound with increased cortical echogenicity with no hydronephrosis likely secondary to  chronic medical renal disease. -- Nephrology following, appreciate assistance -- Cr 3.61>2.88>2.07>1.64 (1.4 -1.6 baseline) -- Stop IV fluids secondary to respiratory failure/shortness of breath -- Hold home HCTZ lisinopril -- Avoid nephrotoxins, renal dose all medications -- Strict I's and O's -- Repeat BMP in a.m.  Acute hypoxic respiratory failure Overnight, patient complains of shortness of breath, now requiring supplemental oxygen.  Chest x-ray with diffuse vascular congestion with interstitial and parenchymal edema with no focal infiltrate seen.  Patient received furosemide 20 mg IV x 1 this morning. -- Continue supplemental oxygen, maintain SpO2 greater than 92%, currently on 8L HFNC  Hyperkalemia: Resolved Potassium 5.8 on admission, likely secondary to acute renal failure.  Received Patiromer 8.6g oral x1. -- K 5.8>5.4>4.7>4.7 -- Repeat BMP in a.m.  Acute on chronic diarrhea Patient with history of intermittent diarrhea, acutely worsened recently per family.  Recent use of antibiotics with Bactrim.  Patient has not had a bowel movement since the hospitalization and patient remains afebrile without leukocytosis.  Will discontinue C. difficile/GI PCR panel and enteric precautions. -- Supportive care  Essential hypertension -- Carvedilol 25 mg p.o. twice daily -- Amlodipine 10 mg p.o. daily -- Hydralazine 50 mg p.o. 4 times daily -- Holding home HCTZ/lisinopril due to renal failure as above -- Continue monitor BP closely  Hypercholesterolemia -- Atorvastatin 20 mg p.o. daily  Type 2 diabetes mellitus Hemoglobin A1c 6.4 10/21/2021, well-controlled.  Home regimen includes metformin 500 mg p.o. twice daily, and glipizide 5 mg p.o. daily. -- Hold oral hypoglycemics while inpatient -- SSI for coverage -- CBGs qAC/HS  Hx CVA --  Plavix 75 mg p.o. daily  Weakness/debility/deconditioning: Family reports recent falls at home.  Lives with daughter.  PT recommending home  health. -- OT evaluation: No follow-up recommended   DVT prophylaxis: heparin injection 5,000 Units Start: 01/22/22 0600    Code Status: Full Code Family Communication: Updated multiple family members present at bedside this morning  Disposition Plan:  Level of care: Telemetry Medical Status is: Inpatient Remains inpatient appropriate because: Creatinine now close to baseline, now with respiratory failure requiring oxygen received Lasix, anticipate able to discharge home in 1-2 days if able to titrate off of Supple oxygen.    Consultants:  Nephrology, Dr. Holley Raring  Procedures:  None  Antimicrobials:  None   Subjective: Patient seen examined bedside, resting comfortably.  Lying in bed.  Multiple family members and RN Lianne Moris present at bedside.  Overnight, patient developed respiratory distress and was placed on supplemental oxygen with chest x-ray notable for pulmonary edema.  IV fluids were stopped and patient was given IV Lasix.  Shortness of breath slightly improved but remains on supplemental oxygen.  Updated multiple family members in the room.  Discussed will reinitiate some of her blood pressure medicine but will hold her lisinopril and HCTZ as could potentially compromise her renal function.  Discussed with nephrology, Dr. Holley Raring this morning.  Patient with no other specific complaints at this time.  Continues with good urine output. No other questions or concerns at this time.  Patient denies headache, no fever/chills/night sweats, no nausea/vomiting, no chest pain, palpitations, no shortness of breath, no abdominal pain, no focal weakness, no fatigue, no paresthesias.  No acute events overnight per nursing staff.  Objective: Vitals:   01/24/22 0256 01/24/22 0500 01/24/22 0707 01/24/22 0922  BP:  (!) 193/69 (!) 199/59 (!) 193/60  Pulse:  73 68 68  Resp:  18 15 18   Temp:  98.4 F (36.9 C) 99 F (37.2 C) 98.7 F (37.1 C)  TempSrc:  Oral Oral Oral  SpO2: 98% 93%  99% 94%  Weight:      Height:        Intake/Output Summary (Last 24 hours) at 01/24/2022 1225 Last data filed at 01/24/2022 0657 Gross per 24 hour  Intake 963.46 ml  Output 700 ml  Net 263.46 ml   Filed Weights   01/21/22 1840  Weight: 63.5 kg    Examination:  Physical Exam: GEN: NAD, alert and oriented x 3, chronically ill in appearance HEENT: NCAT, PERRL, EOMI, sclera clear, MMM PULM: Breath sounds decreased bilateral bases with crackles, no wheezing, normal respiratory effort without accessory muscle use, on 8 L HFNC CV: RRR w/o M/G/R GI: abd soft, NTND, NABS, no R/G/M MSK: no peripheral edema, muscle strength globally intact 5/5 bilateral upper/lower extremities NEURO: CN II-XII intact, no focal deficits, moves all independently Integumentary: dry/intact, no rashes or wounds    Data Reviewed: I have personally reviewed following labs and imaging studies  CBC: Recent Labs  Lab 01/21/22 1856 01/22/22 0450  WBC 7.1 7.1  HGB 10.4* 10.4*  HCT 31.3* 31.3*  MCV 85.1 85.5  PLT 177 071   Basic Metabolic Panel: Recent Labs  Lab 01/21/22 1856 01/22/22 0450 01/22/22 1050 01/23/22 0531 01/24/22 0453  NA 133* 134*  --  139 138  K 5.8* 5.4* 4.7 4.7 4.7  CL 108 114*  --  123* 114*  CO2 16* 13*  --  13* 17*  GLUCOSE 252* 138*  --  151* 230*  BUN 106* 93*  --  71* 52*  CREATININE 3.61* 2.88*  --  2.07* 1.64*  CALCIUM 8.3* 8.3*  --  7.9* 8.1*  MG 2.7*  --   --   --   --    GFR: Estimated Creatinine Clearance: 24 mL/min (A) (by C-G formula based on SCr of 1.64 mg/dL (H)). Liver Function Tests: Recent Labs  Lab 01/21/22 1856  AST 22  ALT 22  ALKPHOS 47  BILITOT 0.4  PROT 6.8  ALBUMIN 3.5   Recent Labs  Lab 01/21/22 1856  LIPASE 54*   No results for input(s): "AMMONIA" in the last 168 hours. Coagulation Profile: No results for input(s): "INR", "PROTIME" in the last 168 hours. Cardiac Enzymes: No results for input(s): "CKTOTAL", "CKMB", "CKMBINDEX",  "TROPONINI" in the last 168 hours. BNP (last 3 results) No results for input(s): "PROBNP" in the last 8760 hours. HbA1C: No results for input(s): "HGBA1C" in the last 72 hours. CBG: Recent Labs  Lab 01/23/22 0845 01/23/22 1201 01/23/22 1653 01/23/22 2122 01/24/22 0926  GLUCAP 150* 144* 183* 177* 165*   Lipid Profile: No results for input(s): "CHOL", "HDL", "LDLCALC", "TRIG", "CHOLHDL", "LDLDIRECT" in the last 72 hours. Thyroid Function Tests: No results for input(s): "TSH", "T4TOTAL", "FREET4", "T3FREE", "THYROIDAB" in the last 72 hours. Anemia Panel: No results for input(s): "VITAMINB12", "FOLATE", "FERRITIN", "TIBC", "IRON", "RETICCTPCT" in the last 72 hours. Sepsis Labs: No results for input(s): "PROCALCITON", "LATICACIDVEN" in the last 168 hours.  Recent Results (from the past 240 hour(s))  Urine Culture     Status: Abnormal   Collection Time: 01/14/22  5:18 PM   Specimen: Urine, Clean Catch  Result Value Ref Range Status   Specimen Description URINE, CLEAN CATCH  Final   Special Requests   Final    NONE Performed at [facility], [address], [place], [place] [address]    Culture >=100,000 COLONIES/mL ESCHERICHIA COLI (A)  Final   Report Status 01/17/2022 FINAL  Final   Organism ID, Bacteria ESCHERICHIA COLI (A)  Final      Susceptibility   Escherichia coli - MIC*    AMPICILLIN 8 SENSITIVE Sensitive     CEFAZOLIN <=4 SENSITIVE Sensitive     CEFEPIME <=0.12 SENSITIVE Sensitive     CEFTRIAXONE <=0.25 SENSITIVE Sensitive     CIPROFLOXACIN <=0.25 SENSITIVE Sensitive     GENTAMICIN <=1 SENSITIVE Sensitive     IMIPENEM <=0.25 SENSITIVE Sensitive     NITROFURANTOIN <=16 SENSITIVE Sensitive     TRIMETH/SULFA <=20 SENSITIVE Sensitive     AMPICILLIN/SULBACTAM 4 SENSITIVE Sensitive     PIP/TAZO <=4 SENSITIVE Sensitive     * >=100,000 COLONIES/mL ESCHERICHIA COLI  Resp panel by RT-PCR (RSV, Flu A&B, Covid) Anterior Nasal Swab     Status: None   Collection Time:  01/22/22 12:35 AM   Specimen: Anterior Nasal Swab  Result Value Ref Range Status   SARS Coronavirus 2 by RT PCR NEGATIVE NEGATIVE Final    Comment: (NOTE) SARS-CoV-2 target nucleic acids are NOT DETECTED.  The SARS-CoV-2 RNA is generally detectable in upper respiratory specimens during the acute phase of infection. The lowest concentration of SARS-CoV-2 viral copies this assay can detect is 138 copies/mL. A negative result does not preclude SARS-Cov-2 infection and should not be used as the sole basis for treatment or other patient management decisions. A negative result may occur with  improper specimen collection/handling, submission of specimen other than nasopharyngeal swab, presence of viral mutation(s) within the areas targeted by this assay, and inadequate number  of viral copies(<138 copies/mL). A negative result must be combined with clinical observations, patient history, and epidemiological information. The expected result is Negative.  Fact Sheet for Patients:  EntrepreneurPulse.com.au  Fact Sheet for Healthcare Providers:  IncredibleEmployment.be  This test is no t yet approved or cleared by the [place] FDA and  has been authorized for detection and/or diagnosis of SARS-CoV-2 by FDA under an Emergency Use Authorization (EUA). This EUA will remain  in effect (meaning this test can be used) for the duration of the COVID-19 declaration under Section 564(b)(1) of the Act, 21 U.S.C.section 360bbb-3(b)(1), unless the authorization is terminated  or revoked sooner.       Influenza A by PCR NEGATIVE NEGATIVE Final   Influenza B by PCR NEGATIVE NEGATIVE Final    Comment: (NOTE) The Xpert Xpress SARS-CoV-2/FLU/RSV plus assay is intended as an aid in the diagnosis of influenza from Nasopharyngeal swab specimens and should not be used as a sole basis for treatment. Nasal washings and aspirates are unacceptable for Xpert Xpress  SARS-CoV-2/FLU/RSV testing.  Fact Sheet for Patients: EntrepreneurPulse.com.au  Fact Sheet for Healthcare Providers: IncredibleEmployment.be  This test is not yet approved or cleared by the [place] FDA and has been authorized for detection and/or diagnosis of SARS-CoV-2 by FDA under an Emergency Use Authorization (EUA). This EUA will remain in effect (meaning this test can be used) for the duration of the COVID-19 declaration under Section 564(b)(1) of the Act, 21 U.S.C. section 360bbb-3(b)(1), unless the authorization is terminated or revoked.     Resp Syncytial Virus by PCR NEGATIVE NEGATIVE Final    Comment: (NOTE) Fact Sheet for Patients: EntrepreneurPulse.com.au  Fact Sheet for Healthcare Providers: IncredibleEmployment.be  This test is not yet approved or cleared by the [place] FDA and has been authorized for detection and/or diagnosis of SARS-CoV-2 by FDA under an Emergency Use Authorization (EUA). This EUA will remain in effect (meaning this test can be used) for the duration of the COVID-19 declaration under Section 564(b)(1) of the Act, 21 U.S.C. section 360bbb-3(b)(1), unless the authorization is terminated or revoked.  Performed at [facility], [address], [place], [place] [address]          Radiology Studies: [facility] Chest [place] 1 View  Result Date: 01/24/2022 CLINICAL DATA:  Shortness of breath EXAM: PORTABLE CHEST 1 VIEW COMPARISON:  01/22/2022 FINDINGS: Cardiac shadow is stable. Aortic calcifications are seen. Diffuse vascular congestion is noted with interstitial and parenchymal edema new from the prior exam. No focal infiltrate is seen. No bony abnormality is noted. IMPRESSION: Changes consistent with CHF. Electronically Signed   By: Inez Catalina M.D.   On: 01/24/2022 02:52        Scheduled Meds:  [START ON 01/25/2022] amLODipine  10 mg Oral Daily    atorvastatin  40 mg Oral Daily   carvedilol  25 mg Oral BID WC   clopidogrel  75 mg Oral Daily   heparin injection (subcutaneous)  5,000 Units Subcutaneous Q8H   hydrALAZINE  50 mg Oral QID   insulin aspart  0-5 Units Subcutaneous QHS   insulin aspart  0-9 Units Subcutaneous TID WC   levothyroxine  75 mcg Oral Q0600   Continuous Infusions:     LOS: 2 days    Time spent: 48 minutes spent on chart review, discussion with nursing staff, consultants, updating family and interview/physical exam; more than 50% of that time was spent in counseling and/or coordination of care.    Jamison Soward J [place]  DO [facility] Available via Epic secure chat 7am-7pm After these hours, please refer to coverage provider listed on [email or web address] 01/24/2022, 12:25 PM

## 2022-01-24 NOTE — Progress Notes (Signed)
Pt's family at bedside threatening to sue, demanding for pt to be transferred to [facility], an EKG be performed and to speak with the on-call provider.    Neomia Glass, NP notified; EKG and ABGs ordered and implemented.

## 2022-01-24 NOTE — Progress Notes (Signed)
       CROSS COVER NOTE  NAME: Gina Kelly MRN: [id number] DOB : Mar 29, 1940 ATTENDING PHYSICIAN: [place] Eric J, DO    Date of Service   01/24/2022   HPI/Events of Note   Called to bedside for reports of hypoxia SPO2 81% on 3L Nasal Cannula and dyspnea. Gina Kelly is now requiring 8L Nasal Cannula. SPO2 98%  After patient stabilized Family wanted to discuss concerns with care and treatment by staff. Security was called on family by nursing staff this morning. Per RN family wants an EKG and transfer to [facility].   At bedside family also expressing concerns about antibiotic started by outpatient physician, receiving IVF inpatient, stool sample that they report ED physician said that he would order but did not. I discussed concerns with family and answered their questions at bedside.  Interventions   Assessment/Plan:  Hypoxia CXR--> vascular congestion Hold IVF IV lasix ABG--> pH 741 pCO2 27 pO2 63 Bicarbonate 17.1 O2 Sat 93.9 Supplemental O2 for SPO2 >92%, wean as able.  Family Requests EKG-->NSR; unchanged from prior [facility] transfer center called at family's request, they are at capacity and closed to Medicine transfers at this time      To reach the provider On-Call:   7AM- 7PM see care teams to locate the attending and reach out to them via [email or web address]. 7PM-7AM contact night-coverage If you still have difficulty reaching the appropriate provider, please page the [facility] (Director on Call) for [facility] on amion for assistance  This document was prepared using Set designer software and may include unintentional dictation errors.  Neomia Glass DNP, MBA, FNP-BC Nurse Practitioner [facility] Pager [phone]

## 2022-01-24 NOTE — Progress Notes (Signed)
[facility]  ROUNDING NOTE   Subjective:   Patient seen resting quietly, multiple family members at bedside.  Patient appears frail and weak this morning. Appetite remains poor No lower extremity edema  During chart review, it is noted that patient experienced shortness of breath overnight.  IV fluids stopped and patient given a dose of Lasix.  Patient placed on 8 L high flow nasal cannula.  Objective:  Vital signs in last 24 hours:  Temp:  [97.9 F (36.6 C)-99 F (37.2 C)] 98.7 F (37.1 C) (01/01 0922) Pulse Rate:  [62-75] 68 (01/01 0922) Resp:  [15-18] 18 (01/01 0922) BP: (179-199)/(53-69) 193/60 (01/01 0922) SpO2:  [71 %-99 %] 94 % (01/01 0922)  Weight change:  Filed Weights   01/21/22 1840  Weight: 63.5 kg    Intake/Output: I/O last 3 completed shifts: In: 964 [I.V.:964] Out: 700 [Urine:700]   Intake/Output this shift:  No intake/output data recorded.  Physical Exam: General: NAD  Head: Normocephalic, atraumatic. Moist oral mucosal membranes  Eyes: Anicteric  Lungs:  Rhonchi, normal effort, 8 L HFNC  Heart: Regular rate and rhythm  Abdomen:  Soft, nontender  Extremities: No peripheral edema.  Neurologic: Alert and oriented, moving all four extremities  Skin: No lesions  Access: None    Basic Metabolic Panel: Recent Labs  Lab 01/21/22 1856 01/22/22 0450 01/22/22 1050 01/23/22 0531 01/24/22 0453  NA 133* 134*  --  139 138  K 5.8* 5.4* 4.7 4.7 4.7  CL 108 114*  --  123* 114*  CO2 16* 13*  --  13* 17*  GLUCOSE 252* 138*  --  151* 230*  BUN 106* 93*  --  71* 52*  CREATININE 3.61* 2.88*  --  2.07* 1.64*  CALCIUM 8.3* 8.3*  --  7.9* 8.1*  MG 2.7*  --   --   --   --      Liver Function Tests: Recent Labs  Lab 01/21/22 1856  AST 22  ALT 22  ALKPHOS 47  BILITOT 0.4  PROT 6.8  ALBUMIN 3.5    Recent Labs  Lab 01/21/22 1856  LIPASE 54*    No results for input(s): "AMMONIA" in the last 168 hours.  CBC: Recent Labs  Lab  01/21/22 1856 01/22/22 0450  WBC 7.1 7.1  HGB 10.4* 10.4*  HCT 31.3* 31.3*  MCV 85.1 85.5  PLT 177 162     Cardiac Enzymes: No results for input(s): "CKTOTAL", "CKMB", "CKMBINDEX", "TROPONINI" in the last 168 hours.  BNP: Invalid input(s): "POCBNP"  CBG: Recent Labs  Lab 01/23/22 0845 01/23/22 1201 01/23/22 1653 01/23/22 2122 01/24/22 0926  GLUCAP 150* 144* 183* 177* 165*     Microbiology: Results for orders placed or performed during the hospital encounter of 01/22/22  Resp panel by RT-PCR (RSV, Flu A&B, Covid) Anterior Nasal Swab     Status: None   Collection Time: 01/22/22 12:35 AM   Specimen: Anterior Nasal Swab  Result Value Ref Range Status   SARS Coronavirus 2 by RT PCR NEGATIVE NEGATIVE Final    Comment: (NOTE) SARS-CoV-2 target nucleic acids are NOT DETECTED.  The SARS-CoV-2 RNA is generally detectable in upper respiratory specimens during the acute phase of infection. The lowest concentration of SARS-CoV-2 viral copies this assay can detect is 138 copies/mL. A negative result does not preclude SARS-Cov-2 infection and should not be used as the sole basis for treatment or other patient management decisions. A negative result may occur with  improper specimen collection/handling, submission  of specimen other than nasopharyngeal swab, presence of viral mutation(s) within the areas targeted by this assay, and inadequate number of viral copies(<138 copies/mL). A negative result must be combined with clinical observations, patient history, and epidemiological information. The expected result is Negative.  Fact Sheet for Patients:  EntrepreneurPulse.com.au  Fact Sheet for Healthcare Providers:  IncredibleEmployment.be  This test is no t yet approved or cleared by the [place] FDA and  has been authorized for detection and/or diagnosis of SARS-CoV-2 by FDA under an Emergency Use Authorization (EUA). This EUA will  remain  in effect (meaning this test can be used) for the duration of the COVID-19 declaration under Section 564(b)(1) of the Act, 21 U.S.C.section 360bbb-3(b)(1), unless the authorization is terminated  or revoked sooner.       Influenza A by PCR NEGATIVE NEGATIVE Final   Influenza B by PCR NEGATIVE NEGATIVE Final    Comment: (NOTE) The Xpert Xpress SARS-CoV-2/FLU/RSV plus assay is intended as an aid in the diagnosis of influenza from Nasopharyngeal swab specimens and should not be used as a sole basis for treatment. Nasal washings and aspirates are unacceptable for Xpert Xpress SARS-CoV-2/FLU/RSV testing.  Fact Sheet for Patients: EntrepreneurPulse.com.au  Fact Sheet for Healthcare Providers: IncredibleEmployment.be  This test is not yet approved or cleared by the [place] FDA and has been authorized for detection and/or diagnosis of SARS-CoV-2 by FDA under an Emergency Use Authorization (EUA). This EUA will remain in effect (meaning this test can be used) for the duration of the COVID-19 declaration under Section 564(b)(1) of the Act, 21 U.S.C. section 360bbb-3(b)(1), unless the authorization is terminated or revoked.     Resp Syncytial Virus by PCR NEGATIVE NEGATIVE Final    Comment: (NOTE) Fact Sheet for Patients: EntrepreneurPulse.com.au  Fact Sheet for Healthcare Providers: IncredibleEmployment.be  This test is not yet approved or cleared by the [place] FDA and has been authorized for detection and/or diagnosis of SARS-CoV-2 by FDA under an Emergency Use Authorization (EUA). This EUA will remain in effect (meaning this test can be used) for the duration of the COVID-19 declaration under Section 564(b)(1) of the Act, 21 U.S.C. section 360bbb-3(b)(1), unless the authorization is terminated or revoked.  Performed at [facility], [address] [place], [place] [address]      Coagulation Studies: No results for input(s): "LABPROT", "INR" in the last 72 hours.  Urinalysis: Recent Labs    01/22/22 0200  COLORURINE YELLOW*  LABSPEC 1.011  PHURINE 5.0  GLUCOSEU NEGATIVE  HGBUR NEGATIVE  BILIRUBINUR NEGATIVE  KETONESUR NEGATIVE  PROTEINUR NEGATIVE  NITRITE NEGATIVE  LEUKOCYTESUR SMALL*       Imaging: DG Chest Port 1 View  Result Date: 01/24/2022 CLINICAL DATA:  Shortness of breath EXAM: PORTABLE CHEST 1 VIEW COMPARISON:  01/22/2022 FINDINGS: Cardiac shadow is stable. Aortic calcifications are seen. Diffuse vascular congestion is noted with interstitial and parenchymal edema new from the prior exam. No focal infiltrate is seen. No bony abnormality is noted. IMPRESSION: Changes consistent with CHF. Electronically Signed   By: Inez Catalina M.D.   On: 01/24/2022 02:52   US RENAL  Result Date: 01/22/2022 CLINICAL DATA:  Acute renal failure. EXAM: RENAL / URINARY TRACT ULTRASOUND COMPLETE COMPARISON:  Renal ultrasound 03/30/2014; CT abdomen and pelvis without contrast 03/19/2014 FINDINGS: Right Kidney: Renal measurements: 10.1 x 4.9 x 4.9 cm = volume: 129 mL. Increased cortical echogenicity. The cortical thickness measures 0.8 cm. Within the mid to upper pole there is a partially exophytic anechoic  benign simple cyst measuring 7 x 6 x 6 mm. There are some nonshadowing echogenic foci within the parapelvic fat measuring up to 6 mm, 5 mm, and 6 mm that are favored represent foci of fat. Nonobstructing stones are felt less likely. No hydronephrosis. Left Kidney: Renal measurements: 10.2 x 5.1 x 4.8 cm = volume: 130 mL. Increased cortical echogenicity. The cortical thickness measures 1.1 cm. Avascular benign parapelvic simple cysts are seen measuring up to 1.2 cm within the mid to upper pole and 0.9 cm within the midpole. Bladder: Appears normal for degree of bladder distention. Other: None. IMPRESSION: 1. Increased cortical echogenicity of the bilateral kidneys  suggestive of medical renal disease. 2. No hydronephrosis. 3. There are some nonshadowing echogenic foci within the parapelvic fat measuring up to 6 mm, 5 mm, and 6 mm that are favored represent normal focal fat. Nonobstructing stones are possible but felt less likely. 4. Bilateral benign simple renal cysts. No follow-up imaging is recommended. Electronically Signed   By: Neita Garnet M.D.   On: 01/22/2022 11:57     Medications:      [START ON 01/25/2022] amLODipine  10 mg Oral Daily   atorvastatin  40 mg Oral Daily   clopidogrel  75 mg Oral Daily   heparin injection (subcutaneous)  5,000 Units Subcutaneous Q8H   insulin aspart  0-5 Units Subcutaneous QHS   insulin aspart  0-9 Units Subcutaneous TID WC   levothyroxine  75 mcg Oral Q0600   metoprolol succinate  200 mg Oral Daily   acetaminophen **OR** acetaminophen, albuterol, benzonatate, hydrALAZINE, magnesium hydroxide, ondansetron **OR** ondansetron (ZOFRAN) IV, traZODone  Assessment/ Plan:  [name] is a 82 y.o.  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  Baseline creatinine appears to be 1.55 with GFR 34 on 08/06/2021.  Acute kidney injury appears secondary to hypovolemia and decreased oral intake.  Chronic kidney disease likely secondary to hypertension and diabetes.  Renal ultrasound shows nonobstructive renal cyst.    Creatinine continues to improve.  IV fluids stopped due to increased shortness of breath and increased oxygen requirements overnight.  Patient encouraged to increase oral intake.  Will schedule a follow-up appointment in our office at discharge.  Lab Results  Component Value Date   CREATININE 1.64 (H)  01/24/2022   CREATININE 2.07 (H) 01/23/2022   CREATININE 2.88 (H) 01/22/2022    Intake/Output Summary (Last 24 hours) at 01/24/2022 0957 Last data filed at 01/24/2022 0657 Gross per 24 hour  Intake 963.46 ml  Output 700 ml  Net 263.46 ml    2. Anemia of chronic kidney disease  Lab Results  Component Value Date   HGB 10.4 (L) 01/22/2022   Hemoglobin at goal.  Will continue to monitor.  3. Diabetes mellitus type II with chronic kidney disease/renal manifestations: noninsulin dependent. Home regimen includes metformin and glipizide.  Primary team to manage sliding scale insulin. . 4. Hypertension with chronic kidney disease.  Currently with hydrochlorothiazide and metoprolol outpatient.  Blood pressure elevated today, 193/60.  Now receiving amlodipine, carvedilol, and hydralazine.  5.  Acute metabolic acidosis.  Serum bicarb 17.  Sodium bicarb infusion stopped due to increased shortness of breath overnight.  Will continue to monitor and can consider oral  supplementation if needed.    LOS: 2 [place] 1/1/20249:57 AM

## 2022-01-24 NOTE — Progress Notes (Signed)
Notified by pt's daughter of pt c/o sob.  Pt on 2L 02 and sats sustained in the low 80's.  Pt's 02 increased to 4L, expiratory wheezes noted, neb tx administered and Neomia Glass, NP, notified.     Chest x-ray performed.  Pt's 02 sats continued to decrease and she was slowly increased to 10L hfnc.    Pt assessed by RT; IV lasix ordered and administered; 02 sats decreased to 8L hfnc.    Pt's 02 sats currently sustained in the mid 90's on 8L hfnc.  Will continue to monitor and update as needed.

## 2022-01-24 NOTE — TOC Progression Note (Signed)
Transition of Care [facility]) - Progression Note    Patient Details  Name: Gina Kelly MRN: [id number] Date of Birth: Oct 25, 1940  Transition of Care [facility]) CM/SW [place], RN Phone Number: 01/24/2022, 12:24 PM  Clinical Narrative:   Reached out to [place] at [facility] to inquire if they are able to accept the patient for [facility], awaiting a response, Will continue to follow for disposition  Currently on Oxygen [facility] Fries will let me know tomorrow when the office opens if they can accept the patient    Expected Discharge Plan: [place] Barriers to Discharge: Continued Medical Work up  Expected Discharge Plan and Services       Living arrangements for the past 2 months: [place]: PT           Social Determinants of Health (SDOH) Interventions [place]: No Food Insecurity (01/22/2022)  Housing: Low Risk  (01/22/2022)  Transportation Needs: No Transportation Needs (01/22/2022)  Utilities: Not At Risk (01/22/2022)  Tobacco Use: Low Risk  (01/21/2022)    Readmission Risk Interventions    01/23/2022   12:42 PM  Readmission Risk Prevention Plan  Transportation Screening Complete  PCP or Specialist Appt within 5-7 Days Complete  Home Care Screening Complete  Medication Review (RN CM) Complete

## 2022-01-25 DIAGNOSIS — N179 Acute kidney failure, unspecified: Secondary | ICD-10-CM | POA: Diagnosis not present

## 2022-01-25 DIAGNOSIS — N189 Chronic kidney disease, unspecified: Secondary | ICD-10-CM | POA: Diagnosis not present

## 2022-01-25 LAB — BASIC METABOLIC PANEL
Anion gap: 7 (ref 5–15)
BUN: 47 mg/dL — ABNORMAL HIGH (ref 8–23)
CO2: 20 mmol/L — ABNORMAL LOW (ref 22–32)
Calcium: 8.2 mg/dL — ABNORMAL LOW (ref 8.9–10.3)
Chloride: 112 mmol/L — ABNORMAL HIGH (ref 98–111)
Creatinine, Ser: 1.54 mg/dL — ABNORMAL HIGH (ref 0.44–1.00)
GFR, Estimated: 34 mL/min — ABNORMAL LOW (ref >60)
Glucose, Bld: 142 mg/dL — ABNORMAL HIGH (ref 70–99)
Potassium: 4.6 mmol/L (ref 3.5–5.1)
Sodium: 139 mmol/L (ref 135–145)

## 2022-01-25 LAB — GLUCOSE, CAPILLARY
Glucose-Capillary: 205 mg/dL — ABNORMAL HIGH (ref 70–99)
Glucose-Capillary: 150 mg/dL — ABNORMAL HIGH (ref 70–99)
Glucose-Capillary: 232 mg/dL — ABNORMAL HIGH (ref 70–99)
Glucose-Capillary: 164 mg/dL — ABNORMAL HIGH (ref 70–99)

## 2022-01-25 LAB — HEMOGLOBIN A1C
Hgb A1c MFr Bld: 6.2 % — ABNORMAL HIGH (ref 4.8–5.6)
Mean Plasma Glucose: 131 mg/dL

## 2022-01-25 LAB — CBC
HCT: 25.1 % — ABNORMAL LOW (ref 36.0–46.0)
Hemoglobin: 8.4 g/dL — ABNORMAL LOW (ref 12.0–15.0)
MCH: 28.3 pg (ref 26.0–34.0)
MCHC: 33.5 g/dL (ref 30.0–36.0)
MCV: 84.5 fL (ref 80.0–100.0)
Platelets: 118 10*3/uL — ABNORMAL LOW (ref 150–400)
RBC: 2.97 MIL/uL — ABNORMAL LOW (ref 3.87–5.11)
RDW: 15.7 % — ABNORMAL HIGH (ref 11.5–15.5)
WBC: 6.6 10*3/uL (ref 4.0–10.5)
nRBC: 0.3 % — ABNORMAL HIGH (ref 0.0–0.2)

## 2022-01-25 MED ORDER — FUROSEMIDE 10 MG/ML IJ SOLN
20.0000 mg | Freq: Once | INTRAMUSCULAR | Status: AC
  Administered 2022-01-25: 20 mg via INTRAVENOUS
  Filled 2022-01-25: qty 4

## 2022-01-25 MED ORDER — HYDRALAZINE HCL 50 MG PO TABS
100.0000 mg | ORAL_TABLET | Freq: Three times a day (TID) | ORAL | Status: DC
  Administered 2022-01-25 – 2022-01-26 (×5): 100 mg via ORAL
  Filled 2022-01-25 (×5): qty 2

## 2022-01-25 NOTE — Progress Notes (Signed)
[facility]  ROUNDING NOTE   Subjective:   Patient seen sitting up in chair, daughter at bedside Appetite remains poor but improving Remains on 2 L nasal cannula  Creatinine 1.54. Urine output 2.2 L recorded in preceding 24 hours.  Objective:  Vital signs in last 24 hours:  Temp:  [98.2 F (36.8 C)-98.9 F (37.2 C)] 98.9 F (37.2 C) (01/02 0845) Pulse Rate:  [63-72] 63 (01/02 0845) Resp:  [17-19] 19 (01/02 0600) BP: (153-184)/(40-70) 153/57 (01/02 0845) SpO2:  [95 %-100 %] 100 % (01/02 0845)  Weight change:  Filed Weights   01/21/22 1840  Weight: 63.5 kg    Intake/Output: I/O last 3 completed shifts: In: 846.1 [I.V.:846.1] Out: 2950 [Urine:2950]   Intake/Output this shift:  No intake/output data recorded.  Physical Exam: General: NAD  Head: Normocephalic, atraumatic. Moist oral mucosal membranes  Eyes: Anicteric  Lungs:  Clear to auscultation, normal effort, 2 L [place]  Heart: Regular rate and rhythm  Abdomen:  Soft, nontender  Extremities: No peripheral edema.  Neurologic: Alert and oriented, moving all four extremities  Skin: No lesions  Access: None    Basic Metabolic Panel: Recent Labs  Lab 01/21/22 1856 01/22/22 0450 01/22/22 1050 01/23/22 0531 01/24/22 0453 01/25/22 0348  NA 133* 134*  --  139 138 139  K 5.8* 5.4* 4.7 4.7 4.7 4.6  CL 108 114*  --  123* 114* 112*  CO2 16* 13*  --  13* 17* 20*  GLUCOSE 252* 138*  --  151* 230* 142*  BUN 106* 93*  --  71* 52* 47*  CREATININE 3.61* 2.88*  --  2.07* 1.64* 1.54*  CALCIUM 8.3* 8.3*  --  7.9* 8.1* 8.2*  MG 2.7*  --   --   --   --   --      Liver Function Tests: Recent Labs  Lab 01/21/22 1856  AST 22  ALT 22  ALKPHOS 47  BILITOT 0.4  PROT 6.8  ALBUMIN 3.5    Recent Labs  Lab 01/21/22 1856  LIPASE 54*    No results for input(s): "AMMONIA" in the last 168 hours.  CBC: Recent Labs  Lab 01/21/22 1856 01/22/22 0450 01/25/22 0348  WBC 7.1 7.1 6.6  HGB 10.4* 10.4* 8.4*   HCT 31.3* 31.3* 25.1*  MCV 85.1 85.5 84.5  PLT 177 162 118*     Cardiac Enzymes: No results for input(s): "CKTOTAL", "CKMB", "CKMBINDEX", "TROPONINI" in the last 168 hours.  BNP: Invalid input(s): "POCBNP"  CBG: Recent Labs  Lab 01/24/22 1518 01/24/22 1716 01/24/22 2142 01/25/22 0903 01/25/22 1121  GLUCAP 218* 276* 150* 205* 150*     Microbiology: Results for orders placed or performed during the hospital encounter of 01/22/22  Resp panel by RT-PCR (RSV, Flu A&B, Covid) Anterior Nasal Swab     Status: None   Collection Time: 01/22/22 12:35 AM   Specimen: Anterior Nasal Swab  Result Value Ref Range Status   SARS Coronavirus 2 by RT PCR NEGATIVE NEGATIVE Final    Comment: (NOTE) SARS-CoV-2 target nucleic acids are NOT DETECTED.  The SARS-CoV-2 RNA is generally detectable in upper respiratory specimens during the acute phase of infection. The lowest concentration of SARS-CoV-2 viral copies this assay can detect is 138 copies/mL. A negative result does not preclude SARS-Cov-2 infection and should not be used as the sole basis for treatment or other patient management decisions. A negative result may occur with  improper specimen collection/handling, submission of specimen other than nasopharyngeal  swab, presence of viral mutation(s) within the areas targeted by this assay, and inadequate number of viral copies(<138 copies/mL). A negative result must be combined with clinical observations, patient history, and epidemiological information. The expected result is Negative.  Fact Sheet for Patients:  EntrepreneurPulse.com.au  Fact Sheet for Healthcare Providers:  IncredibleEmployment.be  This test is no t yet approved or cleared by the [place] FDA and  has been authorized for detection and/or diagnosis of SARS-CoV-2 by FDA under an Emergency Use Authorization (EUA). This EUA will remain  in effect (meaning this test can be  used) for the duration of the COVID-19 declaration under Section 564(b)(1) of the Act, 21 U.S.C.section 360bbb-3(b)(1), unless the authorization is terminated  or revoked sooner.       Influenza A by PCR NEGATIVE NEGATIVE Final   Influenza B by PCR NEGATIVE NEGATIVE Final    Comment: (NOTE) The Xpert Xpress SARS-CoV-2/FLU/RSV plus assay is intended as an aid in the diagnosis of influenza from Nasopharyngeal swab specimens and should not be used as a sole basis for treatment. Nasal washings and aspirates are unacceptable for Xpert Xpress SARS-CoV-2/FLU/RSV testing.  Fact Sheet for Patients: EntrepreneurPulse.com.au  Fact Sheet for Healthcare Providers: IncredibleEmployment.be  This test is not yet approved or cleared by the [place] FDA and has been authorized for detection and/or diagnosis of SARS-CoV-2 by FDA under an Emergency Use Authorization (EUA). This EUA will remain in effect (meaning this test can be used) for the duration of the COVID-19 declaration under Section 564(b)(1) of the Act, 21 U.S.C. section 360bbb-3(b)(1), unless the authorization is terminated or revoked.     Resp Syncytial Virus by PCR NEGATIVE NEGATIVE Final    Comment: (NOTE) Fact Sheet for Patients: EntrepreneurPulse.com.au  Fact Sheet for Healthcare Providers: IncredibleEmployment.be  This test is not yet approved or cleared by the [place] FDA and has been authorized for detection and/or diagnosis of SARS-CoV-2 by FDA under an Emergency Use Authorization (EUA). This EUA will remain in effect (meaning this test can be used) for the duration of the COVID-19 declaration under Section 564(b)(1) of the Act, 21 U.S.C. section 360bbb-3(b)(1), unless the authorization is terminated or revoked.  Performed at [facility], [address] [place], [place] [address]     Coagulation Studies: No results for  input(s): "LABPROT", "INR" in the last 72 hours.  Urinalysis: No results for input(s): "COLORURINE", "LABSPEC", "PHURINE", "GLUCOSEU", "HGBUR", "BILIRUBINUR", "KETONESUR", "PROTEINUR", "UROBILINOGEN", "NITRITE", "LEUKOCYTESUR" in the last 72 hours.  Invalid input(s): "APPERANCEUR"     Imaging: DG Chest Port 1 View  Result Date: 01/24/2022 CLINICAL DATA:  Shortness of breath EXAM: PORTABLE CHEST 1 VIEW COMPARISON:  01/22/2022 FINDINGS: Cardiac shadow is stable. Aortic calcifications are seen. Diffuse vascular congestion is noted with interstitial and parenchymal edema new from the prior exam. No focal infiltrate is seen. No bony abnormality is noted. IMPRESSION: Changes consistent with CHF. Electronically Signed   By: Inez Catalina M.D.   On: 01/24/2022 02:52     Medications:      amLODipine  10 mg Oral Daily   atorvastatin  40 mg Oral Daily   carvedilol  25 mg Oral BID WC   clopidogrel  75 mg Oral Daily   heparin injection (subcutaneous)  5,000 Units Subcutaneous Q8H   hydrALAZINE  100 mg Oral Q8H   insulin aspart  0-5 Units Subcutaneous QHS   insulin aspart  0-9 Units Subcutaneous TID WC   levothyroxine  75 mcg Oral Q0600   acetaminophen **  OR** acetaminophen, albuterol, benzonatate, hydrALAZINE, magnesium hydroxide, ondansetron **OR** ondansetron (ZOFRAN) IV, traZODone  Assessment/ Plan:  [name] is a 82 y.o.  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  Baseline creatinine appears to be 1.55 with GFR 34 on 08/06/2021.  Acute kidney injury appears secondary to hypovolemia and decreased oral intake.  Chronic kidney disease likely secondary to hypertension and diabetes.   Renal ultrasound shows nonobstructive renal cyst.    Creatinine has returned to baseline.  Successful oxygen weaning, currently on 2 L nasal cannula.  No acute need for dialysis.  Patient will need follow-up in our office at discharge.  Lab Results  Component Value Date   CREATININE 1.54 (H) 01/25/2022   CREATININE 1.64 (H) 01/24/2022   CREATININE 2.07 (H) 01/23/2022    Intake/Output Summary (Last 24 hours) at 01/25/2022 1318 Last data filed at 01/25/2022 0606 Gross per 24 hour  Intake --  Output 1450 ml  Net -1450 ml    2. Anemia of chronic kidney disease  Lab Results  Component Value Date   HGB 8.4 (L) 01/25/2022   Hemoglobin slightly decreased today.  Continuing to monitor  3. Diabetes mellitus type II with chronic kidney disease/renal manifestations: noninsulin dependent. Home regimen includes metformin and glipizide.  Sliding scale insulin managed by primary team. . 4. Hypertension with chronic kidney disease.  Currently with hydrochlorothiazide and metoprolol outpatient.  Receiving amlodipine, carvedilol, and hydralazine.  5.  Acute metabolic acidosis.  Serum bicarb 17.  Sodium bicarb infusion stopped due to increased shortness of breath.  Continues to slowly improve.  Due to renal recovery we will sign off at this time.  Patient will follow-up in our office at discharge.    LOS: [place] 1/2/20241:18 PM

## 2022-01-25 NOTE — Plan of Care (Signed)
Problem: Education: Goal: Ability to describe self-care measures that may prevent or decrease complications (Diabetes Survival Skills Education) will improve 01/25/2022 0135 by Francis Dowse, RN Outcome: Progressing 01/25/2022 0014 by Francis Dowse, RN Outcome: Progressing Goal: Individualized Educational Video(s) 01/25/2022 0135 by Francis Dowse, RN Outcome: Progressing 01/25/2022 0014 by Francis Dowse, RN Outcome: Progressing   Problem: Coping: Goal: Ability to adjust to condition or change in health will improve 01/25/2022 0135 by Francis Dowse, RN Outcome: Progressing 01/25/2022 0014 by Francis Dowse, RN Outcome: Progressing   Problem: Fluid Volume: Goal: Ability to maintain a balanced intake and output will improve 01/25/2022 0135 by Francis Dowse, RN Outcome: Progressing 01/25/2022 0014 by Francis Dowse, RN Outcome: Progressing   Problem: Health Behavior/Discharge Planning: Goal: Ability to identify and utilize available resources and services will improve 01/25/2022 0135 by Francis Dowse, RN Outcome: Progressing 01/25/2022 0014 by Francis Dowse, RN Outcome: Progressing Goal: Ability to manage health-related needs will improve 01/25/2022 0135 by Francis Dowse, RN Outcome: Progressing 01/25/2022 0014 by Francis Dowse, RN Outcome: Progressing   Problem: Metabolic: Goal: Ability to maintain appropriate glucose levels will improve 01/25/2022 0135 by Francis Dowse, RN Outcome: Progressing 01/25/2022 0014 by Francis Dowse, RN Outcome: Progressing   Problem: Nutritional: Goal: Maintenance of adequate nutrition will improve 01/25/2022 0135 by Francis Dowse, RN Outcome: Progressing 01/25/2022 0014 by Francis Dowse, RN Outcome: Progressing Goal: Progress toward achieving an optimal weight will improve 01/25/2022 0135 by Francis Dowse, RN Outcome:  Progressing 01/25/2022 0014 by Francis Dowse, RN Outcome: Progressing   Problem: Skin Integrity: Goal: Risk for impaired skin integrity will decrease 01/25/2022 0135 by Francis Dowse, RN Outcome: Progressing 01/25/2022 0014 by Francis Dowse, RN Outcome: Progressing   Problem: Tissue Perfusion: Goal: Adequacy of tissue perfusion will improve 01/25/2022 0135 by Francis Dowse, RN Outcome: Progressing 01/25/2022 0014 by Francis Dowse, RN Outcome: Progressing   Problem: Education: Goal: Knowledge of General Education information will improve Description: Including pain rating scale, medication(s)/side effects and non-pharmacologic comfort measures 01/25/2022 0135 by Francis Dowse, RN Outcome: Progressing 01/25/2022 0014 by Francis Dowse, RN Outcome: Progressing   Problem: Health Behavior/Discharge Planning: Goal: Ability to manage health-related needs will improve 01/25/2022 0135 by Francis Dowse, RN Outcome: Progressing 01/25/2022 0014 by Francis Dowse, RN Outcome: Progressing   Problem: Clinical Measurements: Goal: Ability to maintain clinical measurements within normal limits will improve 01/25/2022 0135 by Francis Dowse, RN Outcome: Progressing 01/25/2022 0014 by Francis Dowse, RN Outcome: Progressing Goal: Will remain free from infection 01/25/2022 0135 by Francis Dowse, RN Outcome: Progressing 01/25/2022 0014 by Francis Dowse, RN Outcome: Progressing Goal: Diagnostic test results will improve 01/25/2022 0135 by Francis Dowse, RN Outcome: Progressing 01/25/2022 0014 by Francis Dowse, RN Outcome: Progressing Goal: Respiratory complications will improve 01/25/2022 0135 by Francis Dowse, RN Outcome: Progressing 01/25/2022 0014 by Francis Dowse, RN Outcome: Progressing Goal: Cardiovascular complication will be avoided 01/25/2022 0135 by Francis Dowse, RN Outcome: Progressing 01/25/2022 0014 by Francis Dowse, RN Outcome: Progressing   Problem: Activity: Goal: Risk for activity intolerance will decrease 01/25/2022 0135 by Francis Dowse, RN Outcome: Progressing 01/25/2022 0014 by Francis Dowse, RN Outcome: Progressing   Problem: Nutrition: Goal: Adequate nutrition will be maintained 01/25/2022 0135 by Francis Dowse, RN Outcome: Progressing 01/25/2022 0014 by Francis Dowse, RN Outcome: Progressing  Problem: Coping: Goal: Level of anxiety will decrease 01/25/2022 0135 by Francis Dowse, RN Outcome: Progressing 01/25/2022 0014 by Francis Dowse, RN Outcome: Progressing   Problem: Elimination: Goal: Will not experience complications related to bowel motility 01/25/2022 0135 by Francis Dowse, RN Outcome: Progressing 01/25/2022 0014 by Francis Dowse, RN Outcome: Progressing Goal: Will not experience complications related to urinary retention 01/25/2022 0135 by Francis Dowse, RN Outcome: Progressing 01/25/2022 0014 by Francis Dowse, RN Outcome: Progressing   Problem: Pain Managment: Goal: General experience of comfort will improve 01/25/2022 0135 by Francis Dowse, RN Outcome: Progressing 01/25/2022 0014 by Francis Dowse, RN Outcome: Progressing   Problem: Safety: Goal: Ability to remain free from injury will improve 01/25/2022 0135 by Francis Dowse, RN Outcome: Progressing 01/25/2022 0014 by Francis Dowse, RN Outcome: Progressing   Problem: Skin Integrity: Goal: Risk for impaired skin integrity will decrease 01/25/2022 0135 by Francis Dowse, RN Outcome: Progressing 01/25/2022 0014 by Francis Dowse, RN Outcome: Progressing

## 2022-01-25 NOTE — Plan of Care (Signed)

## 2022-01-25 NOTE — TOC Progression Note (Addendum)
Transition of Care [facility]) - Progression Note    Patient Details  Name: Gina Kelly MRN: [id number] Date of Birth: 11-Nov-1940  Transition of Care [facility]) CM/SW [place], RN Phone Number: 01/25/2022, 10:19 AM  Clinical Narrative:   [facility] is not able to accept the patient at this time Reached out to [facility] to inquire if they can accept the patient Awaiting a response Update, [facility] is not able to accept the patient    Expected Discharge Plan: [place] Barriers to Discharge: Continued Medical Work up  Expected Discharge Plan and [place] arrangements for the past 2 months: [place]: PT           Social Determinants of Health (SDOH) Interventions [place]: No Food Insecurity (01/22/2022)  Housing: Low Risk  (01/22/2022)  Transportation Needs: No Transportation Needs (01/22/2022)  Utilities: Not At Risk (01/22/2022)  Tobacco Use: Low Risk  (01/21/2022)    Readmission Risk Interventions    01/23/2022   12:42 PM  Readmission Risk Prevention Plan  Transportation Screening Complete  PCP or Specialist Appt within 5-7 Days Complete  Home Care Screening Complete  Medication Review (RN CM) Complete

## 2022-01-25 NOTE — Progress Notes (Signed)
SATURATION QUALIFICATIONS: (This note is used to comply with regulatory documentation for home oxygen)  Patient Saturations on Room Air at Rest = 96%  Patient Saturations on Room Air while Ambulating = 97%  Patient Saturations on 0 Liters of oxygen while Ambulating = 96-97%  Please briefly explain why patient needs home oxygen:NA: patient ambulated 170 ft on   RA with sats 96-97%

## 2022-01-25 NOTE — TOC Progression Note (Signed)
Transition of Care [facility]) - Progression Note    Patient Details  Name: Gina Kelly MRN: [id number] Date of Birth: 08/04/1940  Transition of Care [facility]) CM/SW [place], RN Phone Number: 01/25/2022, 3:55 PM  Clinical Narrative:     [facility]  Expected Discharge Plan: [place] Barriers to Discharge: Continued Medical Work up  Expected Discharge Plan and [place] arrangements for the past 2 months: [place]: PT           Social Determinants of Health (SDOH) Interventions [place]: No Food Insecurity (01/22/2022)  Housing: Low Risk  (01/22/2022)  Transportation Needs: No Transportation Needs (01/22/2022)  Utilities: Not At Risk (01/22/2022)  Tobacco Use: Low Risk  (01/21/2022)    Readmission Risk Interventions    01/23/2022   12:42 PM  Readmission Risk Prevention Plan  Transportation Screening Complete  PCP or Specialist Appt within 5-7 Days Complete  Home Care Screening Complete  Medication Review (RN CM) Complete

## 2022-01-25 NOTE — Progress Notes (Signed)
PROGRESS NOTE    Gina Kelly  OFB:[id number] DOB: 12/23/1940 DOA: 01/22/2022 PCP: [place]    Brief Narrative:   Gina Kelly is a 82 y.o. female with past medical history significant for type 2 diabetes mellitus, CKD stage IIIb/IV, essential hypertension, hypercholesterolemia, history of chronic diarrhea, Hx CVA who presented to [facility] ED on 12/29 with acute onset dizziness, diminished oral intake, recent diarrhea.  Patient was seen in urgent care for UTI recently on 12/22 and prescribed Bactrim DS in which she took for 4 days and then was advised to discontinue due to worsening renal function.  Patient reports chills without fever.  Denies chest pain, no palpitations, no cough/congestion, no wheezing, no nausea/vomiting, no abdominal pain, no current urinary symptoms.  In the ED, temperature 7.8 F, HR 54, RR 16, BP 125/49, SpO2 98% on room air.  Sodium 133, potassium 5.8, chloride 108, CO2 16, glucose 252, BUN 106, creatinine 3.61.  Lipase 54.  AST 22, ALT 22, total bilirubin 0.4.  WBC 7.1, hemoglobin 10.4, platelets 177.  Cova-19 PCR negative.  Influenza A/B PCR negative.  Urinalysis with small leukocytes, negative nitrite, rare bacteria, 6-10 WBCs.  EKG with sinus bradycardia, rate 53 with low voltage QRS and anteroseptal Q waves.  X-ray chest with no acute cardiopulmonary disease process.  Patient was given 1 L bolus IV LR, 500 mL IV normal saline and 8.4 mg p.o. [place].  TRH consulted for admission for further evaluation management of acute renal failure on CKD stage IIIb and hyperkalemia.  Assessment & Plan:   Acute renal failure on CKD stage IIIb: Resolved Baseline creatinine 1.4-1.6 with GFR 35.  Creatinine notably elevated to 3.61 on 12/29 with a BUN of 106.  Patient with reported acute on chronic diarrhea also with con commitment Bactrim/HCTZ use is likely confounding factor.  Renal ultrasound with increased cortical echogenicity with no hydronephrosis likely  secondary to chronic medical renal disease. -- Nephrology following, appreciate assistance -- Cr 3.61>2.88>2.07>1.64>1.54 (1.4 -1.6 baseline) -- Stop IV fluids secondary to respiratory failure/shortness of breath -- Hold home HCTZ lisinopril -- Avoid nephrotoxins, renal dose all medications -- Strict I's and O's -- Repeat BMP in a.m.  Acute hypoxic respiratory failure Overnight, patient complains of shortness of breath, now requiring supplemental oxygen.  Chest x-ray with diffuse vascular congestion with interstitial and parenchymal edema with no focal infiltrate seen.  -- Repeat furosemide 20 mg IV x 1 this morning. -- Continue supplemental oxygen, maintain SpO2 greater than 92%, currently on 2L [place] -- Ambulatory O2 screen  Hyperkalemia: Resolved Potassium 5.8 on admission, likely secondary to acute renal failure.  Received Patiromer 8.6g oral x1. -- K 5.8>5.4>4.7>4.7 -- Repeat BMP in a.m.  Acute on chronic diarrhea Patient with history of intermittent diarrhea, acutely worsened recently per family.  Recent use of antibiotics with Bactrim.  Patient has not had a bowel movement since the hospitalization and patient remains afebrile without leukocytosis.  Will discontinue C. difficile/GI PCR panel and enteric precautions. -- Supportive care  Daytime somnolence Daughter reports patient tends to fall asleep during the day watching TV.  Suspicious for obstructive sleep apnea. -- Would benefit from outpatient sleep study  Essential hypertension -- Carvedilol 25 mg p.o. twice daily -- Amlodipine 10 mg p.o. daily -- increase Hydralazine to 100 mg p.o. TID -- Holding home HCTZ/lisinopril due to renal failure as above; patient also on metoprolol outpatient, unclear why she was on 2 beta-blockers.  If blood pressure remains poorly controlled may need to consider  starting clonidine as HCTZ and lisinopril not the best option given her labile renal function -- Continue monitor BP  closely  Hypercholesterolemia -- Atorvastatin 20 mg p.o. daily  Type 2 diabetes mellitus Hemoglobin A1c 6.4 10/21/2021, well-controlled.  Home regimen includes metformin 500 mg p.o. twice daily, and glipizide 5 mg p.o. daily; but may not be her best option given her renal dysfunction. -- Hold oral hypoglycemics while inpatient -- SSI for coverage -- CBGs qAC/HS  Hx CVA -- Plavix 75 mg p.o. daily  Weakness/debility/deconditioning: Family reports recent falls at home.  Lives with daughter.  PT recommending home health. -- OT evaluation: No follow-up recommended   DVT prophylaxis: heparin injection 5,000 Units Start: 01/22/22 0600    Code Status: Full Code Family Communication: Updated daughter present at bedside this morning  Disposition Plan:  Level of care: Telemetry Medical Status is: Inpatient Remains inpatient appropriate because: Creatinine now close to baseline, now with respiratory failure requiring oxygen received Lasix, anticipate able to discharge home in 1-2 days if able to titrate off of Supple oxygen.    Consultants:  Nephrology, Dr. Cherylann Ratel  Procedures:  None  Antimicrobials:  None   Subjective: Patient seen examined bedside, resting comfortably.  Sitting in bedside chair.  Daughter and RN present.  Shortness of breath improved, now down to 2 L nasal cannula.  Creatinine now within her normal range.  Discussed with daughter will give 1 more dose of IV Lasix today and attempt O2 walk screen.  Daughter also endorses that patient does have daytime somnolence at home, discussed with her likely will need sleep study as this is concerning for obstructive sleep apnea. Patient with no other specific complaints at this time, other than wishes to discharge home today.  Continues with good urine output. No other questions or concerns at this time.  Patient denies headache, no fever/chills/night sweats, no nausea/vomiting, no chest pain, palpitations, no shortness of breath,  no abdominal pain, no focal weakness, no fatigue, no paresthesias.  No acute events overnight per nursing staff.  Objective: Vitals:   01/24/22 2321 01/25/22 0600 01/25/22 0603 01/25/22 0845  BP: (!) 178/53 (!) 184/63 (!) 161/51 (!) 153/57  Pulse: 67 72 72 63  Resp: 18 19    Temp: 98.7 F (37.1 C) 98.4 F (36.9 C) 98.4 F (36.9 C) 98.9 F (37.2 C)  TempSrc:      SpO2: 96% 95% 95% 100%  Weight:      Height:        Intake/Output Summary (Last 24 hours) at 01/25/2022 1012 Last data filed at 01/25/2022 0606 Gross per 24 hour  Intake --  Output 2250 ml  Net -2250 ml   Filed Weights   01/21/22 1840  Weight: 63.5 kg    Examination:  Physical Exam: GEN: NAD, alert and oriented x 3, chronically ill in appearance HEENT: NCAT, PERRL, EOMI, sclera clear, MMM PULM: Breath sounds decreased bilateral bases, no wheezing/crackles, normal respiratory effort without accessory muscle use, on 2L [place] CV: RRR w/o M/G/R GI: abd soft, NTND, NABS, no R/G/M MSK: no peripheral edema, muscle strength globally intact 5/5 bilateral upper/lower extremities NEURO: CN II-XII intact, no focal deficits, moves all independently Integumentary: dry/intact, no rashes or wounds    Data Reviewed: I have personally reviewed following labs and imaging studies  CBC: Recent Labs  Lab 01/21/22 1856 01/22/22 0450 01/25/22 0348  WBC 7.1 7.1 6.6  HGB 10.4* 10.4* 8.4*  HCT 31.3* 31.3* 25.1*  MCV 85.1 85.5 84.5  PLT  177 162 118*   Basic Metabolic Panel: Recent Labs  Lab 01/21/22 1856 01/22/22 0450 01/22/22 1050 01/23/22 0531 01/24/22 0453 01/25/22 0348  NA 133* 134*  --  139 138 139  K 5.8* 5.4* 4.7 4.7 4.7 4.6  CL 108 114*  --  123* 114* 112*  CO2 16* 13*  --  13* 17* 20*  GLUCOSE 252* 138*  --  151* 230* 142*  BUN 106* 93*  --  71* 52* 47*  CREATININE 3.61* 2.88*  --  2.07* 1.64* 1.54*  CALCIUM 8.3* 8.3*  --  7.9* 8.1* 8.2*  MG 2.7*  --   --   --   --   --    GFR: Estimated Creatinine  Clearance: 25.5 mL/min (A) (by C-G formula based on SCr of 1.54 mg/dL (H)). Liver Function Tests: Recent Labs  Lab 01/21/22 1856  AST 22  ALT 22  ALKPHOS 47  BILITOT 0.4  PROT 6.8  ALBUMIN 3.5   Recent Labs  Lab 01/21/22 1856  LIPASE 54*   No results for input(s): "AMMONIA" in the last 168 hours. Coagulation Profile: No results for input(s): "INR", "PROTIME" in the last 168 hours. Cardiac Enzymes: No results for input(s): "CKTOTAL", "CKMB", "CKMBINDEX", "TROPONINI" in the last 168 hours. BNP (last 3 results) No results for input(s): "PROBNP" in the last 8760 hours. HbA1C: No results for input(s): "HGBA1C" in the last 72 hours. CBG: Recent Labs  Lab 01/24/22 1314 01/24/22 1518 01/24/22 1716 01/24/22 2142 01/25/22 0903  GLUCAP 147* 218* 276* 150* 205*   Lipid Profile: No results for input(s): "CHOL", "HDL", "LDLCALC", "TRIG", "CHOLHDL", "LDLDIRECT" in the last 72 hours. Thyroid Function Tests: No results for input(s): "TSH", "T4TOTAL", "FREET4", "T3FREE", "THYROIDAB" in the last 72 hours. Anemia Panel: No results for input(s): "VITAMINB12", "FOLATE", "FERRITIN", "TIBC", "IRON", "RETICCTPCT" in the last 72 hours. Sepsis Labs: No results for input(s): "PROCALCITON", "LATICACIDVEN" in the last 168 hours.  Recent Results (from the past 240 hour(s))  Resp panel by RT-PCR (RSV, Flu A&B, Covid) Anterior Nasal Swab     Status: None   Collection Time: 01/22/22 12:35 AM   Specimen: Anterior Nasal Swab  Result Value Ref Range Status   SARS Coronavirus 2 by RT PCR NEGATIVE NEGATIVE Final    Comment: (NOTE) SARS-CoV-2 target nucleic acids are NOT DETECTED.  The SARS-CoV-2 RNA is generally detectable in upper respiratory specimens during the acute phase of infection. The lowest concentration of SARS-CoV-2 viral copies this assay can detect is 138 copies/mL. A negative result does not preclude SARS-Cov-2 infection and should not be used as the sole basis for treatment  or other patient management decisions. A negative result may occur with  improper specimen collection/handling, submission of specimen other than nasopharyngeal swab, presence of viral mutation(s) within the areas targeted by this assay, and inadequate number of viral copies(<138 copies/mL). A negative result must be combined with clinical observations, patient history, and epidemiological information. The expected result is Negative.  Fact Sheet for Patients:  BloggerCourse.com  Fact Sheet for Healthcare Providers:  SeriousBroker.it  This test is no t yet approved or cleared by the [place] FDA and  has been authorized for detection and/or diagnosis of SARS-CoV-2 by FDA under an Emergency Use Authorization (EUA). This EUA will remain  in effect (meaning this test can be used) for the duration of the COVID-19 declaration under Section 564(b)(1) of the Act, 21 U.S.C.section 360bbb-3(b)(1), unless the authorization is terminated  or revoked sooner.  Influenza A by PCR NEGATIVE NEGATIVE Final   Influenza B by PCR NEGATIVE NEGATIVE Final    Comment: (NOTE) The Xpert Xpress SARS-CoV-2/FLU/RSV plus assay is intended as an aid in the diagnosis of influenza from Nasopharyngeal swab specimens and should not be used as a sole basis for treatment. Nasal washings and aspirates are unacceptable for Xpert Xpress SARS-CoV-2/FLU/RSV testing.  Fact Sheet for Patients: EntrepreneurPulse.com.au  Fact Sheet for Healthcare Providers: IncredibleEmployment.be  This test is not yet approved or cleared by the [place] FDA and has been authorized for detection and/or diagnosis of SARS-CoV-2 by FDA under an Emergency Use Authorization (EUA). This EUA will remain in effect (meaning this test can be used) for the duration of the COVID-19 declaration under Section 564(b)(1) of the Act, 21 U.S.C. section  360bbb-3(b)(1), unless the authorization is terminated or revoked.     Resp Syncytial Virus by PCR NEGATIVE NEGATIVE Final    Comment: (NOTE) Fact Sheet for Patients: EntrepreneurPulse.com.au  Fact Sheet for Healthcare Providers: IncredibleEmployment.be  This test is not yet approved or cleared by the [place] FDA and has been authorized for detection and/or diagnosis of SARS-CoV-2 by FDA under an Emergency Use Authorization (EUA). This EUA will remain in effect (meaning this test can be used) for the duration of the COVID-19 declaration under Section 564(b)(1) of the Act, 21 U.S.C. section 360bbb-3(b)(1), unless the authorization is terminated or revoked.  Performed at [facility], [address], [place], [place] [address]          Radiology Studies: [facility] Chest [place] 1 View  Result Date: 01/24/2022 CLINICAL DATA:  Shortness of breath EXAM: PORTABLE CHEST 1 VIEW COMPARISON:  01/22/2022 FINDINGS: Cardiac shadow is stable. Aortic calcifications are seen. Diffuse vascular congestion is noted with interstitial and parenchymal edema new from the prior exam. No focal infiltrate is seen. No bony abnormality is noted. IMPRESSION: Changes consistent with CHF. Electronically Signed   By: Inez Catalina M.D.   On: 01/24/2022 02:52        Scheduled Meds:  amLODipine  10 mg Oral Daily   atorvastatin  40 mg Oral Daily   carvedilol  25 mg Oral BID WC   clopidogrel  75 mg Oral Daily   furosemide  20 mg Intravenous Once   heparin injection (subcutaneous)  5,000 Units Subcutaneous Q8H   hydrALAZINE  100 mg Oral Q8H   insulin aspart  0-5 Units Subcutaneous QHS   insulin aspart  0-9 Units Subcutaneous TID WC   levothyroxine  75 mcg Oral Q0600   Continuous Infusions:     LOS: 3 days    Time spent: 48 minutes spent on chart review, discussion with nursing staff, consultants, updating family and interview/physical exam; more than 50% of  that time was spent in counseling and/or coordination of care.    Daysen Gundrum J [place] DO [facility] Available via Epic secure chat 7am-7pm After these hours, please refer to coverage provider listed on [email or web address] 01/25/2022, 10:12 AM

## 2022-01-25 NOTE — Plan of Care (Signed)

## 2022-01-25 NOTE — Care Management Important Message (Signed)
Important Message  Patient Details  Name: Gina Kelly MRN: [id number] Date of Birth: 02/21/1940   Medicare Important Message Given:  N/A - LOS <3 / Initial given by admissions     Juliann Pulse A Nashalie Sallis 01/25/2022, 10:43 AM

## 2022-01-26 DIAGNOSIS — N189 Chronic kidney disease, unspecified: Secondary | ICD-10-CM | POA: Diagnosis not present

## 2022-01-26 DIAGNOSIS — N179 Acute kidney failure, unspecified: Secondary | ICD-10-CM | POA: Diagnosis not present

## 2022-01-26 LAB — GLUCOSE, CAPILLARY
Glucose-Capillary: 146 mg/dL — ABNORMAL HIGH (ref 70–99)
Glucose-Capillary: 202 mg/dL — ABNORMAL HIGH (ref 70–99)

## 2022-01-26 LAB — BASIC METABOLIC PANEL
Anion gap: 7 (ref 5–15)
BUN: 51 mg/dL — ABNORMAL HIGH (ref 8–23)
CO2: 19 mmol/L — ABNORMAL LOW (ref 22–32)
Calcium: 8 mg/dL — ABNORMAL LOW (ref 8.9–10.3)
Chloride: 110 mmol/L (ref 98–111)
Creatinine, Ser: 1.6 mg/dL — ABNORMAL HIGH (ref 0.44–1.00)
GFR, Estimated: 32 mL/min — ABNORMAL LOW (ref >60)
Glucose, Bld: 192 mg/dL — ABNORMAL HIGH (ref 70–99)
Potassium: 4.4 mmol/L (ref 3.5–5.1)
Sodium: 136 mmol/L (ref 135–145)

## 2022-01-26 MED ORDER — CLONIDINE HCL 0.1 MG PO TABS: 0.1000 mg | ORAL_TABLET | Freq: Two times a day (BID) | ORAL | 0 refills | Status: DC

## 2022-01-26 MED ORDER — CLONIDINE HCL 0.1 MG PO TABS
0.1000 mg | ORAL_TABLET | Freq: Two times a day (BID) | ORAL | Status: DC
  Administered 2022-01-26: 0.1 mg via ORAL
  Filled 2022-01-26: qty 1

## 2022-01-26 MED ORDER — HYDRALAZINE HCL 100 MG PO TABS: 100.0000 mg | ORAL_TABLET | Freq: Three times a day (TID) | ORAL | 0 refills | Status: DC

## 2022-01-26 NOTE — Discharge Summary (Signed)
Physician Discharge Summary  Monty Mccarrell HWE:[id number] DOB: [birth year] DOA: 01/22/2022  PCP: [facility]  Admit date: 01/22/2022 Discharge date: 01/26/2022  Admitted From: Home Disposition: Home  Recommendations for Outpatient Follow-up:  Follow up with PCP in 1-2 weeks Please obtain BMP/CBC in one week your next doctors visit.  -Stop lisinopril and hydrochlorothiazide -Should not take Toprol-XL while also taking Coreg.  Heart rate already in 60s only on Coreg.  Discussed with outpatient provider regarding Toprol-XL. -Clonidine 0.1 mg twice daily added for better blood pressure control.  Further adjust as necessary -Outpatient follow-up with PCP and nephrology.     Discharge Condition: Stable CODE STATUS: Full code Diet recommendation: Heart healthy, low-salt  Brief/Interim Summary: 82 y.o. female with past medical history significant for type 2 diabetes mellitus, CKD stage IIIb/IV, essential hypertension, hypercholesterolemia, history of chronic diarrhea, Hx CVA who presented to [facility] ED on 12/29 with acute onset dizziness, diminished oral intake, recent diarrhea. Patient was seen in urgent care for UTI recently on 12/22 and prescribed Bactrim DS in which she took for 4 days and then was advised to discontinue due to worsening renal function.  During hospitalization patient was found to have acute kidney injury and uncontrolled blood pressure.  Initially hospital course was also complicated by fluid overload requiring IV Lasix.  Today patient is medically doing stable and will be discharged with recommendations as stated above. Daughter present at bedside during my evaluation on the day of discharge.   Assessment and Plan:  Acute renal failure on CKD stage IIIb: Resolved Baseline creatinine 1.4-1.6 with GFR 35.  Creatinine notably elevated to 3.61 on 12/29 with a BUN of 106.  Patient with reported acute on chronic diarrhea also with con commitment Bactrim/HCTZ use is  likely confounding factor.  Renal ultrasound with increased cortical echogenicity with no hydronephrosis likely secondary to chronic medical renal disease.  Admission creatinine 3.6, today it has improved and returned to baseline of 1.5.  For now we will be discontinuing HCTZ, lisinopril.  Due to uncontrolled blood pressure, clonidine has been added.   Acute hypoxic respiratory failure From fluid overload upon admission received IV fluids due to AKI.  Thereafter received Lasix.  Now resolved.   Hyperkalemia: Resolved Potassium 5.8 on admission, likely secondary to acute renal failure.  Received Patiromer 8.6g oral x1. Potassium today is 4.4   Acute on chronic diarrhea, resolved Patient with history of intermittent diarrhea, acutely worsened recently per family.  Recent use of antibiotics with Bactrim.  Patient has not had a bowel movement since the hospitalization and patient remains afebrile without leukocytosis.  Will discontinue C. difficile/GI PCR panel and enteric precautions. -- Supportive care   Daytime somnolence Resolved, sitting up in the bed.   Essential hypertension -- Carvedilol 25 mg p.o. twice daily -- Amlodipine 10 mg p.o. daily -- increase Hydralazine to 100 mg p.o. TID -- Holding home HCTZ/lisinopril due to renal failure as above; patient also on metoprolol outpatient, unclear why she was on 2 beta-blockers.  Patient needs to clarify outpatient provider about her Toprol-XL.  For now I have added clonidine 0.1 mg twice daily, adjust as necessary. -- Advised close diet at home   Hypercholesterolemia -- Atorvastatin 20 mg p.o. daily   Type 2 diabetes mellitus Hemoglobin A1c 6.4 10/21/2021, well-controlled.  Resume home regimen as renal function has not returned to baseline   Hx CVA -- Plavix 75 mg p.o. daily   Weakness/debility/deconditioning: Family reports recent falls at home.  Lives with daughter.  PT recommending home health. -- OT evaluation: No follow-up  recommended      Discharge Diagnoses:  Principal Problem:   Acute kidney injury superimposed on chronic kidney disease ([place]) Active Problems:   Hyperkalemia   Essential hypertension   Type 2 diabetes mellitus with chronic kidney disease, without long-term current use of insulin ([place])   Dyslipidemia   History of CVA (cerebrovascular accident)      Consultations: Nephrology  Subjective: Feels great no complaints.  Wishing to go home.  Discharge Exam: Vitals:   01/25/22 2112 01/26/22 0827  BP: (!) 162/68 (!) 179/55  Pulse: 62 66  Resp: 17   Temp: 98 F (36.7 C) 97.8 F (36.6 C)  SpO2: 97% 96%   Vitals:   01/25/22 0845 01/25/22 1608 01/25/22 2112 01/26/22 0827  BP: (!) 153/57 (!) 143/52 (!) 162/68 (!) 179/55  Pulse: 63 63 62 66  Resp:   17   Temp: 98.9 F (37.2 C) 97.9 F (36.6 C) 98 F (36.7 C) 97.8 F (36.6 C)  TempSrc:      SpO2: 100% 99% 97% 96%  Weight:      Height:        General: Pt is alert, awake, not in acute distress Cardiovascular: RRR, S1/S2 +, no rubs, no gallops Respiratory: CTA bilaterally, no wheezing, no rhonchi Abdominal: Soft, NT, ND, bowel sounds + Extremities: no edema, no cyanosis  Discharge Instructions   Allergies as of 01/26/2022       Reactions   Metformin Diarrhea   CKD   Penicillins         Medication List     STOP taking these medications    hydrochlorothiazide 25 MG tablet Commonly known as: HYDRODIURIL   lisinopril 40 MG tablet Commonly known as: ZESTRIL   metoprolol 200 MG 24 hr tablet Commonly known as: TOPROL-XL   sulfamethoxazole-trimethoprim 800-160 MG tablet Commonly known as: BACTRIM DS       TAKE these medications    acetaminophen 500 MG tablet Commonly known as: TYLENOL Take 1,000 mg by mouth every 6 (six) hours as needed.   albuterol 108 (90 Base) MCG/ACT inhaler Commonly known as: VENTOLIN HFA Inhale 2 puffs into the lungs every 6 (six) hours as needed for wheezing or shortness of  breath.   amLODipine 10 MG tablet Commonly known as: NORVASC Take 10 mg by mouth daily.   atorvastatin 40 MG tablet Commonly known as: LIPITOR Take 40 mg by mouth daily.   carvedilol 25 MG tablet Commonly known as: COREG Take 25 mg by mouth 2 (two) times daily with a meal.   cloNIDine 0.1 MG tablet Commonly known as: CATAPRES Take 1 tablet (0.1 mg total) by mouth 2 (two) times daily.   clopidogrel 75 MG tablet Commonly known as: PLAVIX Take 75 mg by mouth daily.   fluticasone 50 MCG/ACT nasal spray Commonly known as: FLONASE Place 2 sprays into both nostrils daily as needed for allergies or rhinitis.   glipiZIDE 5 MG 24 hr tablet Commonly known as: GLUCOTROL XL Take 5 mg by mouth 2 (two) times daily with a meal.   hydrALAZINE 100 MG tablet Commonly known as: APRESOLINE Take 1 tablet (100 mg total) by mouth every 8 (eight) hours. What changed:  medication strength how much to take when to take this   levothyroxine 75 MCG tablet Commonly known as: SYNTHROID Take 75 mcg by mouth daily before breakfast.   omeprazole 20 MG capsule Commonly known as: PRILOSEC Take 20 mg by mouth  daily as needed (acid reflux symptoms).   Ozempic (1 MG/DOSE) 4 MG/3ML Sopn Generic drug: Semaglutide (1 MG/DOSE) Inject 1 mg into the skin once a week. (Unless blood glucose reading had been trending low)        Follow-up Information     [facility] Follow up in 1 week(s).   Contact information: [address] [place] [address] [phone]         [facility] Follow up in 1 week(s).   Contact information: [address] [place] [address] [phone]                Allergies  Allergen Reactions   Metformin Diarrhea    CKD   Penicillins     You were cared for by a hospitalist during your hospital stay. If you have any questions about your discharge medications or the care you received while you were in the hospital  after you are discharged, you can call the unit and asked to speak with the hospitalist on call if the hospitalist that took care of you is not available. Once you are discharged, your primary care physician will handle any further medical issues. Please note that no refills for any discharge medications will be authorized once you are discharged, as it is imperative that you return to your primary care physician (or establish a relationship with a primary care physician if you do not have one) for your aftercare needs so that they can reassess your need for medications and monitor your lab values.   Procedures/Studies: DG Chest Port 1 View  Result Date: 01/24/2022 CLINICAL DATA:  Shortness of breath EXAM: PORTABLE CHEST 1 VIEW COMPARISON:  01/22/2022 FINDINGS: Cardiac shadow is stable. Aortic calcifications are seen. Diffuse vascular congestion is noted with interstitial and parenchymal edema new from the prior exam. No focal infiltrate is seen. No bony abnormality is noted. IMPRESSION: Changes consistent with CHF. Electronically Signed   By: Alcide CleverMark  Lukens M.D.   On: 01/24/2022 02:52   US RENAL  Result Date: 01/22/2022 CLINICAL DATA:  Acute renal failure. EXAM: RENAL / URINARY TRACT ULTRASOUND COMPLETE COMPARISON:  Renal ultrasound 03/30/2014; CT abdomen and pelvis without contrast 03/19/2014 FINDINGS: Right Kidney: Renal measurements: 10.1 x 4.9 x 4.9 cm = volume: 129 mL. Increased cortical echogenicity. The cortical thickness measures 0.8 cm. Within the mid to upper pole there is a partially exophytic anechoic benign simple cyst measuring 7 x 6 x 6 mm. There are some nonshadowing echogenic foci within the parapelvic fat measuring up to 6 mm, 5 mm, and 6 mm that are favored represent foci of fat. Nonobstructing stones are felt less likely. No hydronephrosis. Left Kidney: Renal measurements: 10.2 x 5.1 x 4.8 cm = volume: 130 mL. Increased cortical echogenicity. The cortical thickness measures 1.1 cm.  Avascular benign parapelvic simple cysts are seen measuring up to 1.2 cm within the mid to upper pole and 0.9 cm within the midpole. Bladder: Appears normal for degree of bladder distention. Other: None. IMPRESSION: 1. Increased cortical echogenicity of the bilateral kidneys suggestive of medical renal disease. 2. No hydronephrosis. 3. There are some nonshadowing echogenic foci within the parapelvic fat measuring up to 6 mm, 5 mm, and 6 mm that are favored represent normal focal fat. Nonobstructing stones are possible but felt less likely. 4. Bilateral benign simple renal cysts. No follow-up imaging is recommended. Electronically Signed   By: Neita Garnetonald  Viola M.D.   On: 01/22/2022 11:57  DG Chest 2 View  Result Date: 01/22/2022 CLINICAL DATA:  Weakness EXAM: CHEST - 2 VIEW COMPARISON:  09/12/2019 FINDINGS: The heart size and mediastinal contours are within normal limits. Both lungs are clear. The visualized skeletal structures are unremarkable. IMPRESSION: No active cardiopulmonary disease. Electronically Signed   By: Ulyses Jarred M.D.   On: 01/22/2022 01:08     The results of significant diagnostics from this hospitalization (including imaging, microbiology, ancillary and laboratory) are listed below for reference.     Microbiology: Recent Results (from the past 240 hour(s))  Resp panel by RT-PCR (RSV, Flu A&B, Covid) Anterior Nasal Swab     Status: None   Collection Time: 01/22/22 12:35 AM   Specimen: Anterior Nasal Swab  Result Value Ref Range Status   SARS Coronavirus 2 by RT PCR NEGATIVE NEGATIVE Final    Comment: (NOTE) SARS-CoV-2 target nucleic acids are NOT DETECTED.  The SARS-CoV-2 RNA is generally detectable in upper respiratory specimens during the acute phase of infection. The lowest concentration of SARS-CoV-2 viral copies this assay can detect is 138 copies/mL. A negative result does not preclude SARS-Cov-2 infection and should not be used as the sole basis for treatment  or other patient management decisions. A negative result may occur with  improper specimen collection/handling, submission of specimen other than nasopharyngeal swab, presence of viral mutation(s) within the areas targeted by this assay, and inadequate number of viral copies(<138 copies/mL). A negative result must be combined with clinical observations, patient history, and epidemiological information. The expected result is Negative.  Fact Sheet for Patients:  EntrepreneurPulse.com.au  Fact Sheet for Healthcare Providers:  IncredibleEmployment.be  This test is no t yet approved or cleared by the [place] FDA and  has been authorized for detection and/or diagnosis of SARS-CoV-2 by FDA under an Emergency Use Authorization (EUA). This EUA will remain  in effect (meaning this test can be used) for the duration of the COVID-19 declaration under Section 564(b)(1) of the Act, 21 U.S.C.section 360bbb-3(b)(1), unless the authorization is terminated  or revoked sooner.       Influenza A by PCR NEGATIVE NEGATIVE Final   Influenza B by PCR NEGATIVE NEGATIVE Final    Comment: (NOTE) The Xpert Xpress SARS-CoV-2/FLU/RSV plus assay is intended as an aid in the diagnosis of influenza from Nasopharyngeal swab specimens and should not be used as a sole basis for treatment. Nasal washings and aspirates are unacceptable for Xpert Xpress SARS-CoV-2/FLU/RSV testing.  Fact Sheet for Patients: EntrepreneurPulse.com.au  Fact Sheet for Healthcare Providers: IncredibleEmployment.be  This test is not yet approved or cleared by the [place] FDA and has been authorized for detection and/or diagnosis of SARS-CoV-2 by FDA under an Emergency Use Authorization (EUA). This EUA will remain in effect (meaning this test can be used) for the duration of the COVID-19 declaration under Section 564(b)(1) of the Act, 21 U.S.C. section  360bbb-3(b)(1), unless the authorization is terminated or revoked.     Resp Syncytial Virus by PCR NEGATIVE NEGATIVE Final    Comment: (NOTE) Fact Sheet for Patients: EntrepreneurPulse.com.au  Fact Sheet for Healthcare Providers: IncredibleEmployment.be  This test is not yet approved or cleared by the [place] FDA and has been authorized for detection and/or diagnosis of SARS-CoV-2 by FDA under an Emergency Use Authorization (EUA). This EUA will remain in effect (meaning this test can be used) for the duration of the COVID-19 declaration under Section 564(b)(1) of the Act, 21 U.S.C. section 360bbb-3(b)(1), unless the authorization is terminated or revoked.  Performed at [facility], [address] [place], [place] [address]      Labs: BNP (last 3 results) No results for input(s): "BNP" in the last 8760 hours. Basic Metabolic Panel: Recent Labs  Lab 01/21/22 1856 01/22/22 0450 01/22/22 1050 01/23/22 0531 01/24/22 0453 01/25/22 0348 01/26/22 0422  NA 133* 134*  --  139 138 139 136  K 5.8* 5.4* 4.7 4.7 4.7 4.6 4.4  CL 108 114*  --  123* 114* 112* 110  CO2 16* 13*  --  13* 17* 20* 19*  GLUCOSE 252* 138*  --  151* 230* 142* 192*  BUN 106* 93*  --  71* 52* 47* 51*  CREATININE 3.61* 2.88*  --  2.07* 1.64* 1.54* 1.60*  CALCIUM 8.3* 8.3*  --  7.9* 8.1* 8.2* 8.0*  MG 2.7*  --   --   --   --   --   --    Liver Function Tests: Recent Labs  Lab 01/21/22 1856  AST 22  ALT 22  ALKPHOS 47  BILITOT 0.4  PROT 6.8  ALBUMIN 3.5   Recent Labs  Lab 01/21/22 1856  LIPASE 54*   No results for input(s): "AMMONIA" in the last 168 hours. CBC: Recent Labs  Lab 01/21/22 1856 01/22/22 0450 01/25/22 0348  WBC 7.1 7.1 6.6  HGB 10.4* 10.4* 8.4*  HCT 31.3* 31.3* 25.1*  MCV 85.1 85.5 84.5  PLT 177 162 118*   Cardiac Enzymes: No results for input(s): "CKTOTAL", "CKMB", "CKMBINDEX", "TROPONINI" in the last 168  hours. BNP: Invalid input(s): "POCBNP" CBG: Recent Labs  Lab 01/25/22 1121 01/25/22 1732 01/25/22 2105 01/26/22 0739 01/26/22 1206  GLUCAP 150* 232* 164* 146* 202*   D-Dimer No results for input(s): "DDIMER" in the last 72 hours. Hgb A1c No results for input(s): "HGBA1C" in the last 72 hours. Lipid Profile No results for input(s): "CHOL", "HDL", "LDLCALC", "TRIG", "CHOLHDL", "LDLDIRECT" in the last 72 hours. Thyroid function studies No results for input(s): "TSH", "T4TOTAL", "T3FREE", "THYROIDAB" in the last 72 hours.  Invalid input(s): "FREET3" Anemia work up No results for input(s): "VITAMINB12", "FOLATE", "FERRITIN", "TIBC", "IRON", "RETICCTPCT" in the last 72 hours. Urinalysis    Component Value Date/Time   COLORURINE YELLOW (A) 01/22/2022 0200   APPEARANCEUR CLEAR (A) 01/22/2022 0200   APPEARANCEUR Cloudy 10/21/2011 0239   LABSPEC 1.011 01/22/2022 0200   LABSPEC 1.014 10/21/2011 0239   PHURINE 5.0 01/22/2022 0200   GLUCOSEU NEGATIVE 01/22/2022 0200   GLUCOSEU >=500 10/21/2011 0239   HGBUR NEGATIVE 01/22/2022 0200   BILIRUBINUR NEGATIVE 01/22/2022 0200   BILIRUBINUR negative 01/14/2022 1717   BILIRUBINUR Negative 10/21/2011 0239   KETONESUR NEGATIVE 01/22/2022 0200   PROTEINUR NEGATIVE 01/22/2022 0200   UROBILINOGEN 0.2 01/14/2022 1717   NITRITE NEGATIVE 01/22/2022 0200   LEUKOCYTESUR SMALL (A) 01/22/2022 0200   LEUKOCYTESUR Negative 10/21/2011 0239   Sepsis Labs Recent Labs  Lab 01/21/22 1856 01/22/22 0450 01/25/22 0348  WBC 7.1 7.1 6.6   Microbiology Recent Results (from the past 240 hour(s))  Resp panel by RT-PCR (RSV, Flu A&B, Covid) Anterior Nasal Swab     Status: None   Collection Time: 01/22/22 12:35 AM   Specimen: Anterior Nasal Swab  Result Value Ref Range Status   SARS Coronavirus 2 by RT PCR NEGATIVE NEGATIVE Final    Comment: (NOTE) SARS-CoV-2 target nucleic acids are NOT DETECTED.  The SARS-CoV-2 RNA is generally detectable in upper  respiratory specimens during the acute phase of infection. The lowest concentration  of SARS-CoV-2 viral copies this assay can detect is 138 copies/mL. A negative result does not preclude SARS-Cov-2 infection and should not be used as the sole basis for treatment or other patient management decisions. A negative result may occur with  improper specimen collection/handling, submission of specimen other than nasopharyngeal swab, presence of viral mutation(s) within the areas targeted by this assay, and inadequate number of viral copies(<138 copies/mL). A negative result must be combined with clinical observations, patient history, and epidemiological information. The expected result is Negative.  Fact Sheet for Patients:  BloggerCourse.com  Fact Sheet for Healthcare Providers:  SeriousBroker.it  This test is no t yet approved or cleared by the [place] FDA and  has been authorized for detection and/or diagnosis of SARS-CoV-2 by FDA under an Emergency Use Authorization (EUA). This EUA will remain  in effect (meaning this test can be used) for the duration of the COVID-19 declaration under Section 564(b)(1) of the Act, 21 U.S.C.section 360bbb-3(b)(1), unless the authorization is terminated  or revoked sooner.       Influenza A by PCR NEGATIVE NEGATIVE Final   Influenza B by PCR NEGATIVE NEGATIVE Final    Comment: (NOTE) The Xpert Xpress SARS-CoV-2/FLU/RSV plus assay is intended as an aid in the diagnosis of influenza from Nasopharyngeal swab specimens and should not be used as a sole basis for treatment. Nasal washings and aspirates are unacceptable for Xpert Xpress SARS-CoV-2/FLU/RSV testing.  Fact Sheet for Patients: BloggerCourse.com  Fact Sheet for Healthcare Providers: SeriousBroker.it  This test is not yet approved or cleared by the [place] FDA and has been  authorized for detection and/or diagnosis of SARS-CoV-2 by FDA under an Emergency Use Authorization (EUA). This EUA will remain in effect (meaning this test can be used) for the duration of the COVID-19 declaration under Section 564(b)(1) of the Act, 21 U.S.C. section 360bbb-3(b)(1), unless the authorization is terminated or revoked.     Resp Syncytial Virus by PCR NEGATIVE NEGATIVE Final    Comment: (NOTE) Fact Sheet for Patients: BloggerCourse.com  Fact Sheet for Healthcare Providers: SeriousBroker.it  This test is not yet approved or cleared by the [place] FDA and has been authorized for detection and/or diagnosis of SARS-CoV-2 by FDA under an Emergency Use Authorization (EUA). This EUA will remain in effect (meaning this test can be used) for the duration of the COVID-19 declaration under Section 564(b)(1) of the Act, 21 U.S.C. section 360bbb-3(b)(1), unless the authorization is terminated or revoked.  Performed at [facility], [address] [place], [place] [address]      Time coordinating discharge:  I have spent 35 minutes face to face with the patient and on the ward discussing the patients care, assessment, plan and disposition with other care givers. >50% of the time was devoted counseling the patient about the risks and benefits of treatment/Discharge disposition and coordinating care.   SIGNED:   Dimple Nanas, MD  [facility] 01/26/2022, 1:40 PM   If 7PM-7AM, please contact night-coverage

## 2022-01-26 NOTE — TOC Progression Note (Signed)
Transition of Care [facility]) - Progression Note    Patient Details  Name: Gina Kelly MRN: [id number] Date of Birth: 04-May-1940  Transition of Care [facility]) CM/SW [place], RN Phone Number: 01/26/2022, 1:58 PM  Clinical Narrative:     Patient to go home with her Daughter Gina, Kelly for [facility] services  Expected Discharge Plan: [place] Barriers to Discharge: Continued Medical Work up  Expected Discharge Plan and [place] arrangements for the past 2 months: Single Family Home Expected Discharge Date: 01/26/22                         [facility] Arranged: PT           Social Determinants of Health (SDOH) Interventions SDOH Screenings   Food Insecurity: No Food Insecurity (01/22/2022)  Housing: Low Risk  (01/22/2022)  Transportation Needs: No Transportation Needs (01/22/2022)  Utilities: Not At Risk (01/22/2022)  Tobacco Use: Low Risk  (01/21/2022)    Readmission Risk Interventions    01/23/2022   12:42 PM  Readmission Risk Prevention Plan  Transportation Screening Complete  PCP or Specialist Appt within 5-7 Days Complete  Home Care Screening Complete  Medication Review (RN CM) Complete

## 2022-01-26 NOTE — Plan of Care (Signed)

## 2022-01-26 NOTE — Plan of Care (Signed)
Patient discharged per MD orders at this time.All dc instructions,education and medications reviewed with the patient.Pt expressed understanding and will comply with dc instructions. follow up appointments was also communicated to the patient.no verbal c/o or any ssx of distress.Patient was discharged home with self care per order.Pt was transported home by daughter in a privately owned vehicle.

## 2022-01-26 NOTE — Discharge Instructions (Signed)
-  Stop lisinopril and hydrochlorothiazide -Should not take Toprol-XL while also taking Coreg.  Heart rate already in 60s only on Coreg.  Discussed with outpatient provider regarding Toprol-XL. -Clonidine 0.1 mg twice daily added for better blood pressure control.  Further adjust as necessary -Outpatient follow-up with PCP and nephrology.

## 2022-01-26 NOTE — Care Management Important Message (Signed)
Important Message  Patient Details  Name: Gina Kelly MRN: [id number] Date of Birth: [birth year]   Medicare Important Message Given:  Yes     Juliann Pulse A Maor Meckel 01/26/2022, 2:19 PM

## 2022-02-07 DIAGNOSIS — D631 Anemia in chronic kidney disease: Secondary | ICD-10-CM | POA: Diagnosis present

## 2022-02-17 ENCOUNTER — Emergency Department
Admission: EM | Admit: 2022-02-17 | Discharge: 2022-02-17 | Disposition: A | Discharge status: Home / Self Care | Payer: 59 | Attending: Emergency Medicine | Admitting: Emergency Medicine

## 2022-02-17 ENCOUNTER — Other Ambulatory Visit: Payer: Self-pay

## 2022-02-17 ENCOUNTER — Emergency Department: Payer: 59

## 2022-02-17 DIAGNOSIS — R200 Anesthesia of skin: Secondary | ICD-10-CM | POA: Insufficient documentation

## 2022-02-17 DIAGNOSIS — W0110XA Fall on same level from slipping, tripping and stumbling with subsequent striking against unspecified object, initial encounter: Secondary | ICD-10-CM | POA: Insufficient documentation

## 2022-02-17 DIAGNOSIS — W19XXXA Unspecified fall, initial encounter: Secondary | ICD-10-CM

## 2022-02-17 DIAGNOSIS — Y92481 Parking lot as the place of occurrence of the external cause: Secondary | ICD-10-CM | POA: Insufficient documentation

## 2022-02-17 DIAGNOSIS — Z8673 Personal history of transient ischemic attack (TIA), and cerebral infarction without residual deficits: Secondary | ICD-10-CM | POA: Diagnosis not present

## 2022-02-17 DIAGNOSIS — E119 Type 2 diabetes mellitus without complications: Secondary | ICD-10-CM | POA: Insufficient documentation

## 2022-02-17 DIAGNOSIS — I10 Essential (primary) hypertension: Secondary | ICD-10-CM | POA: Insufficient documentation

## 2022-02-17 NOTE — ED Provider Triage Note (Signed)
Emergency Medicine Provider Triage Evaluation Note  Gina Kelly , a 82 y.o. female  was evaluated in triage.  Pt presents to the ER after slipping in the rain and falling. She hit the back of her head on the ground. She denies complaint, but is on Plavix and was told to come to the ER for CT.  Physical Exam  There were no vitals taken for this visit. Gen:   Awake, no distress   Resp:  Normal effort  MSK:   Moves extremities without difficulty  Other:  No focal tenderness/wounds on scalp  Medical Decision Making  Medically screening exam initiated at 5:19 PM.  Appropriate orders placed.  Gina Kelly was informed that the remainder of the evaluation will be completed by another provider, this initial triage assessment does not replace that evaluation, and the importance of remaining in the ED until their evaluation is complete.     Victorino Dike, FNP 02/17/22 1722

## 2022-02-17 NOTE — ED Notes (Signed)
First nurse note-pt brought in via ems from [place] office. Pt fell in the parking lot an struck back of head.  Pt on blood thinners.  No loc  no vomiting.  Pt alert.

## 2022-02-17 NOTE — ED Triage Notes (Signed)
Pt presents to ED via [facility] with c/o of fall while leaving the derm office. Pt denies any issues to cause fall other than it was raining. Pt also had upper leg numbed to have a biopsy done and states that could of also contributed.   Pt does take Plavix. Pt is A&Ox4.

## 2022-02-26 ENCOUNTER — Emergency Department: Payer: 59

## 2022-02-26 ENCOUNTER — Other Ambulatory Visit: Payer: Self-pay

## 2022-02-26 ENCOUNTER — Inpatient Hospital Stay
Admission: EM | Admit: 2022-02-26 | Discharge: 2022-03-02 | DRG: 189 | Disposition: A | Discharge status: Hospice | Payer: 59 | Attending: Student in an Organized Health Care Education/Training Program | Admitting: Student in an Organized Health Care Education/Training Program

## 2022-02-26 ENCOUNTER — Encounter: Payer: Self-pay | Admitting: Emergency Medicine

## 2022-02-26 DIAGNOSIS — D631 Anemia in chronic kidney disease: Secondary | ICD-10-CM | POA: Diagnosis present

## 2022-02-26 DIAGNOSIS — Z90710 Acquired absence of both cervix and uterus: Secondary | ICD-10-CM

## 2022-02-26 DIAGNOSIS — R0602 Shortness of breath: Secondary | ICD-10-CM | POA: Diagnosis not present

## 2022-02-26 DIAGNOSIS — Z515 Encounter for palliative care: Secondary | ICD-10-CM

## 2022-02-26 DIAGNOSIS — E7800 Pure hypercholesterolemia, unspecified: Secondary | ICD-10-CM | POA: Diagnosis present

## 2022-02-26 DIAGNOSIS — Z888 Allergy status to other drugs, medicaments and biological substances status: Secondary | ICD-10-CM

## 2022-02-26 DIAGNOSIS — Z7984 Long term (current) use of oral hypoglycemic drugs: Secondary | ICD-10-CM

## 2022-02-26 DIAGNOSIS — I5031 Acute diastolic (congestive) heart failure: Secondary | ICD-10-CM

## 2022-02-26 DIAGNOSIS — K8020 Calculus of gallbladder without cholecystitis without obstruction: Secondary | ICD-10-CM | POA: Diagnosis present

## 2022-02-26 DIAGNOSIS — I130 Hypertensive heart and chronic kidney disease with heart failure and stage 1 through stage 4 chronic kidney disease, or unspecified chronic kidney disease: Secondary | ICD-10-CM | POA: Diagnosis present

## 2022-02-26 DIAGNOSIS — N1832 Chronic kidney disease, stage 3b: Secondary | ICD-10-CM | POA: Insufficient documentation

## 2022-02-26 DIAGNOSIS — I161 Hypertensive emergency: Secondary | ICD-10-CM | POA: Diagnosis present

## 2022-02-26 DIAGNOSIS — Z7902 Long term (current) use of antithrombotics/antiplatelets: Secondary | ICD-10-CM

## 2022-02-26 DIAGNOSIS — I160 Hypertensive urgency: Secondary | ICD-10-CM

## 2022-02-26 DIAGNOSIS — N189 Chronic kidney disease, unspecified: Secondary | ICD-10-CM

## 2022-02-26 DIAGNOSIS — E669 Obesity, unspecified: Secondary | ICD-10-CM | POA: Diagnosis present

## 2022-02-26 DIAGNOSIS — N179 Acute kidney failure, unspecified: Secondary | ICD-10-CM | POA: Diagnosis present

## 2022-02-26 DIAGNOSIS — J449 Chronic obstructive pulmonary disease, unspecified: Secondary | ICD-10-CM | POA: Diagnosis present

## 2022-02-26 DIAGNOSIS — Z79890 Hormone replacement therapy (postmenopausal): Secondary | ICD-10-CM

## 2022-02-26 DIAGNOSIS — I509 Heart failure, unspecified: Secondary | ICD-10-CM

## 2022-02-26 DIAGNOSIS — N1411 Contrast-induced nephropathy: Secondary | ICD-10-CM | POA: Diagnosis present

## 2022-02-26 DIAGNOSIS — J9601 Acute respiratory failure with hypoxia: Secondary | ICD-10-CM | POA: Diagnosis not present

## 2022-02-26 DIAGNOSIS — E871 Hypo-osmolality and hyponatremia: Secondary | ICD-10-CM

## 2022-02-26 DIAGNOSIS — E861 Hypovolemia: Secondary | ICD-10-CM | POA: Diagnosis present

## 2022-02-26 DIAGNOSIS — I5033 Acute on chronic diastolic (congestive) heart failure: Secondary | ICD-10-CM | POA: Diagnosis present

## 2022-02-26 DIAGNOSIS — Z6826 Body mass index (BMI) 26.0-26.9, adult: Secondary | ICD-10-CM

## 2022-02-26 DIAGNOSIS — Z8673 Personal history of transient ischemic attack (TIA), and cerebral infarction without residual deficits: Secondary | ICD-10-CM

## 2022-02-26 DIAGNOSIS — E1122 Type 2 diabetes mellitus with diabetic chronic kidney disease: Secondary | ICD-10-CM | POA: Diagnosis present

## 2022-02-26 DIAGNOSIS — Z79899 Other long term (current) drug therapy: Secondary | ICD-10-CM

## 2022-02-26 DIAGNOSIS — E119 Type 2 diabetes mellitus without complications: Secondary | ICD-10-CM

## 2022-02-26 DIAGNOSIS — W19XXXA Unspecified fall, initial encounter: Secondary | ICD-10-CM | POA: Diagnosis present

## 2022-02-26 DIAGNOSIS — Z1152 Encounter for screening for COVID-19: Secondary | ICD-10-CM

## 2022-02-26 DIAGNOSIS — Z880 Allergy status to penicillin: Secondary | ICD-10-CM

## 2022-02-26 DIAGNOSIS — J810 Acute pulmonary edema: Secondary | ICD-10-CM

## 2022-02-26 DIAGNOSIS — S3210XA Unspecified fracture of sacrum, initial encounter for closed fracture: Secondary | ICD-10-CM | POA: Diagnosis present

## 2022-02-26 LAB — CBC
HCT: 28.9 % — ABNORMAL LOW (ref 36.0–46.0)
Hemoglobin: 10.2 g/dL — ABNORMAL LOW (ref 12.0–15.0)
MCH: 29 pg (ref 26.0–34.0)
MCHC: 35.3 g/dL (ref 30.0–36.0)
MCV: 82.1 fL (ref 80.0–100.0)
Platelets: 242 10*3/uL (ref 150–400)
RBC: 3.52 MIL/uL — ABNORMAL LOW (ref 3.87–5.11)
RDW: 13.6 % (ref 11.5–15.5)
WBC: 7.1 10*3/uL (ref 4.0–10.5)
nRBC: 0 % (ref 0.0–0.2)

## 2022-02-26 LAB — RESP PANEL BY RT-PCR (RSV, FLU A&B, COVID)  RVPGX2
Influenza A by PCR: NEGATIVE
Influenza B by PCR: NEGATIVE
Resp Syncytial Virus by PCR: NEGATIVE
SARS Coronavirus 2 by RT PCR: NEGATIVE

## 2022-02-26 LAB — COMPREHENSIVE METABOLIC PANEL
ALT: 13 U/L (ref 0–44)
AST: 18 U/L (ref 15–41)
Albumin: 3.8 g/dL (ref 3.5–5.0)
Alkaline Phosphatase: 66 U/L (ref 38–126)
Anion gap: 12 (ref 5–15)
BUN: 28 mg/dL — ABNORMAL HIGH (ref 8–23)
CO2: 18 mmol/L — ABNORMAL LOW (ref 22–32)
Calcium: 8.5 mg/dL — ABNORMAL LOW (ref 8.9–10.3)
Chloride: 96 mmol/L — ABNORMAL LOW (ref 98–111)
Creatinine, Ser: 1.42 mg/dL — ABNORMAL HIGH (ref 0.44–1.00)
GFR, Estimated: 37 mL/min — ABNORMAL LOW (ref >60)
Glucose, Bld: 168 mg/dL — ABNORMAL HIGH (ref 70–99)
Potassium: 3.8 mmol/L (ref 3.5–5.1)
Sodium: 126 mmol/L — ABNORMAL LOW (ref 135–145)
Total Bilirubin: 1 mg/dL (ref 0.3–1.2)
Total Protein: 7 g/dL (ref 6.5–8.1)

## 2022-02-26 LAB — LIPASE, BLOOD: Lipase: 43 U/L (ref 11–51)

## 2022-02-26 LAB — TROPONIN I (HIGH SENSITIVITY)
Troponin I (High Sensitivity): 8 ng/L (ref <18)
Troponin I (High Sensitivity): 12 ng/L (ref <18)

## 2022-02-26 MED ORDER — HYDRALAZINE HCL 50 MG PO TABS
100.0000 mg | ORAL_TABLET | Freq: Once | ORAL | Status: AC
  Administered 2022-02-26: 100 mg via ORAL
  Filled 2022-02-26: qty 2

## 2022-02-26 MED ORDER — IOHEXOL 350 MG/ML SOLN
75.0000 mL | Freq: Once | INTRAVENOUS | Status: AC | PRN
  Administered 2022-02-26: 75 mL via INTRAVENOUS

## 2022-02-26 MED ORDER — LABETALOL HCL 5 MG/ML IV SOLN
10.0000 mg | Freq: Once | INTRAVENOUS | Status: AC
  Administered 2022-02-26: 10 mg via INTRAVENOUS
  Filled 2022-02-26: qty 4

## 2022-02-26 NOTE — ED Provider Notes (Signed)
[facility] Provider Note    Event Date/Time   First MD Initiated Contact with Patient 02/26/22 1932     (approximate)   History   Hypertension   HPI  Gina Kelly is a 82 y.o. female   Past medical history of diabetes, hypertension, prior stroke who presents to the emergency department with ongoing headache, nausea, vomiting, tailbone pain since a fall sustained earlier this week.  She also states she has had nasal congestion and shortness of breath for the last 3 days as well.  She arrives hypertensive with increased respiratory rate of 25 and hypoxemic 88% on room air requiring new nasal cannula oxygen requirement.   Independent Historian contributed to assessment above: Daughter  External Medical Documents Reviewed: CT scan obtained 02/17/2022 CT head and neck negative for acute traumatic injury      Physical Exam   Triage Vital Signs: ED Triage Vitals [02/26/22 1826]  Enc Vitals Group     BP (!) 194/53     Pulse Rate 64     Resp 18     Temp 98.6 F (37 C)     Temp Source Oral     SpO2 96 %     Weight      Height      Head Circumference      Peak Flow      Pain Score 8     Pain Loc      Pain Edu?      Excl. in [place]?     Most recent vital signs: Vitals:   02/26/22 2130 02/26/22 2200  BP: (!) 200/49 (!) 202/63  Pulse: 69 76  Resp: (!) 21 (!) 22  Temp:  98.5 F (36.9 C)  SpO2: 99% 97%    General: Awake, no distress.  CV:  Good peripheral perfusion.  Resp:  Normal effort.  Abd:  No distention.  Other:  Tachypneic, hypertensive, afebrile and hypoxemic 88% on room air better with minimal nasal cannula oxygen.  Lungs clear to auscultation bilateral without focality or wheezing abdomen soft and nontender she does have some sacral pain to palpation and she is able to actively range all extremities at all joints and there are no obvious signs of head trauma.  Alert and oriented.   ED Results / Procedures / Treatments    Labs (all labs ordered are listed, but only abnormal results are displayed) Labs Reviewed  COMPREHENSIVE METABOLIC PANEL - Abnormal; Notable for the following components:      Result Value   Sodium 126 (*)    Chloride 96 (*)    CO2 18 (*)    Glucose, Bld 168 (*)    BUN 28 (*)    Creatinine, Ser 1.42 (*)    Calcium 8.5 (*)    GFR, Estimated 37 (*)    All other components within normal limits  CBC - Abnormal; Notable for the following components:   RBC 3.52 (*)    Hemoglobin 10.2 (*)    HCT 28.9 (*)    All other components within normal limits  RESP PANEL BY RT-PCR (RSV, FLU A&B, COVID)  RVPGX2  LIPASE, BLOOD  URINALYSIS, ROUTINE W REFLEX MICROSCOPIC  TROPONIN I (HIGH SENSITIVITY)  TROPONIN I (HIGH SENSITIVITY)     I ordered and reviewed the above labs they are notable for hyponatremia 126, normal white blood cell count.  EKG  ED ECG REPORT I, Lucillie Garfinkel, the attending physician, personally viewed and interpreted this ECG.  Date: 02/26/2022  EKG Time: 1942  Rate: 74  Rhythm: nsr  Axis: nl  Intervals:short pr  ST&T Change: No acute ischemic changes    RADIOLOGY I independently reviewed and interpreted CT of the head see no obvious bleeding or midline shift   PROCEDURES:  Critical Care performed: Yes, see critical care procedure note(s)  .Critical Care  Performed by: Lucillie Garfinkel, MD Authorized by: Lucillie Garfinkel, MD   Critical care provider statement:    Critical care time (minutes):  30   Critical care was time spent personally by me on the following activities:  Development of treatment plan with patient or surrogate, discussions with consultants, evaluation of patient's response to treatment, examination of patient, ordering and review of laboratory studies, ordering and review of radiographic studies, ordering and performing treatments and interventions, pulse oximetry, re-evaluation of patient's condition and review of old charts    MEDICATIONS ORDERED  IN ED: Medications  hydrALAZINE (APRESOLINE) tablet 100 mg (100 mg Oral Given 02/26/22 2252)     IMPRESSION / MDM / [place] / ED COURSE  I reviewed the triage vital signs and the nursing notes.                                Patient's presentation is most consistent with acute presentation with potential threat to life or bodily function.  Differential diagnosis includes, but is not limited to, traumatic injury to the head or pelvis like intracranial bleeding or fracture dislocation.  Concussion.  URI symptoms and hypoxemia differential diagnosis includes respiratory infection, viral URI, bacterial pneumonia, ACS, PE.   The patient is on the cardiac monitor to evaluate for evidence of arrhythmia and/or significant heart rate changes.  MDM: Patient with complaints of trauma from a week prior with tailbone pain and ongoing headache most likely concussive symptoms.  Obtain CT of the head and pelvic/sacral/coccyx imaging.  Respiratory complaints will check viral URI panel, as well as a chest x-ray, EKG and troponins.  Most likely respiratory infection but if that is not revealing we will get a CTA of the chest to rule out PE.  Thus far been unremarkable with no signs of infection on chest x-ray and viral swabs have been negative.  Given that this patient had recent injury and less mobility over the course of 1 week and now shortness of breath with hypoxemia which is new, will obtain a CT angiogram to rule out PE.  She will need admission if she continues to have new oxygen requirement.       FINAL CLINICAL IMPRESSION(S) / ED DIAGNOSES   Final diagnoses:  Closed fracture of sacrum, unspecified portion of sacrum, initial encounter ([place])  Hypoxia  Shortness of breath     Rx / DC Orders   ED Discharge Orders     None        Note:  This document was prepared using Dragon voice recognition software and may include unintentional dictation errors.    Lucillie Garfinkel,  MD 02/26/22 [phone]

## 2022-02-26 NOTE — ED Notes (Signed)
No urine at this time. Pt will try later.

## 2022-02-26 NOTE — ED Provider Notes (Signed)
----------------------------------------- 11:19 PM on 02/26/2022 -----------------------------------------  Assuming care from Dr. Jacelyn Grip.  In short, Gina Kelly is a 82 y.o. female with a chief complaint of pain in sacrum and chest (with hypoxia).  Refer to the original H&P for additional details.  The current plan of care is to follow up CTA, monitor BP, and admit given acute respiratory failure with hypoxia.   Clinical Course as of 02/27/22 0257  Gina Kelly Feb 27, 2022  0003 CT Angio Chest PE W/Cm &/Or Wo Cm I viewed and interpreted the patient's CTA chest.  I see no evidence of pulmonary embolism but there are some opacities.  The radiologist feels this is most consistent with edema although atypical infection is also possible.  However pneumonia does not correlate clinically given the patient's lack of infectious signs or symptoms.  She reportedly recently had a hospitalization where she was fluid overloaded and it is most likely this is some persistent or recurrent pulmonary edema.  Additionally, the radiologist mentioned the presence of cholelithiasis and question the possibility of cholecystitis.  However, on my reassessment, the patient confirmed that she is having no abdominal pain.  She has no tenderness to palpation of the epigastrium nor the right upper quadrant.  Cholelithiasis is very likely but cholecystitis definitely does not correlate with physical exam, nor with her normal LFTs and lipase. [CF]  0004 Consulting the hospitalist for admission. [CF]  0023 Consulted with Dr. Damita Dunnings who will admit [CF]    Clinical Course User Index [CF] Hinda Kehr, MD     Medications  atorvastatin (LIPITOR) tablet 40 mg (has no administration in time range)  carvedilol (COREG) tablet 25 mg (has no administration in time range)  hydrALAZINE (APRESOLINE) tablet 100 mg (has no administration in time range)  levothyroxine (SYNTHROID) tablet 75 mcg (has no administration in time range)   clopidogrel (PLAVIX) tablet 75 mg (has no administration in time range)  pantoprazole (PROTONIX) EC tablet 40 mg (has no administration in time range)  enoxaparin (LOVENOX) injection 40 mg (has no administration in time range)  acetaminophen (TYLENOL) tablet 650 mg (has no administration in time range)    Or  acetaminophen (TYLENOL) suppository 650 mg (has no administration in time range)  ondansetron (ZOFRAN) tablet 4 mg (has no administration in time range)    Or  ondansetron (ZOFRAN) injection 4 mg (has no administration in time range)  insulin aspart (novoLOG) injection 0-15 Units (has no administration in time range)  insulin aspart (novoLOG) injection 0-5 Units (has no administration in time range)  furosemide (LASIX) injection 40 mg (has no administration in time range)  HYDROcodone-acetaminophen (NORCO/VICODIN) 5-325 MG per tablet 1-2 tablet (has no administration in time range)  albuterol (PROVENTIL) (2.5 MG/3ML) 0.083% nebulizer solution 2.5 mg (has no administration in time range)  hydrALAZINE (APRESOLINE) tablet 100 mg (100 mg Oral Given 02/26/22 2252)  iohexol (OMNIPAQUE) 350 MG/ML injection 75 mL (75 mLs Intravenous Contrast Given 02/26/22 2339)  labetalol (NORMODYNE) injection 10 mg (10 mg Intravenous Given 02/26/22 2352)  cloNIDine (CATAPRES) tablet 0.1 mg (0.1 mg Oral Given 02/27/22 0139)     ED Discharge Orders     None      Final diagnoses:  Closed fracture of sacrum, unspecified portion of sacrum, initial encounter ([facility])  Shortness of breath  Acute respiratory failure with hypoxia ([place])  Hypertensive urgency  Chronic kidney disease, unspecified CKD stage  Acute pulmonary edema (H[facility])  Calculus of gallbladder without cholecystitis without obstruction     Hinda Kehr, MD  02/27/22 0257  

## 2022-02-26 NOTE — ED Notes (Signed)
No urine at this time. Pt will try again later.

## 2022-02-26 NOTE — ED Notes (Signed)
Pt was 88% on RA. RN placed patient on 2 liters and improved .

## 2022-02-26 NOTE — ED Triage Notes (Signed)
Patient to ED via POV for hypertension and vomiting. Vomiting started today with 2 occurences. Also, complaining of pain in both hips since a fall last week at the doctors office. Hx of hypertension.

## 2022-02-26 NOTE — ED Notes (Signed)
Rn to bedside. Pt placed in room and not hooked to monitor. Daughter came to desk to get nurse.   Pt placed on monitor at this time. Pt is CAOx4 and in no acute distress at this time.

## 2022-02-27 ENCOUNTER — Inpatient Hospital Stay (HOSPITAL_COMMUNITY)
Admit: 2022-02-27 | Discharge: 2022-02-27 | Disposition: A | Discharge status: Home / Self Care | Payer: 59 | Attending: Internal Medicine | Admitting: Internal Medicine

## 2022-02-27 DIAGNOSIS — J9601 Acute respiratory failure with hypoxia: Secondary | ICD-10-CM | POA: Diagnosis not present

## 2022-02-27 DIAGNOSIS — I5033 Acute on chronic diastolic (congestive) heart failure: Secondary | ICD-10-CM | POA: Diagnosis present

## 2022-02-27 DIAGNOSIS — E871 Hypo-osmolality and hyponatremia: Secondary | ICD-10-CM | POA: Diagnosis not present

## 2022-02-27 DIAGNOSIS — I5031 Acute diastolic (congestive) heart failure: Secondary | ICD-10-CM

## 2022-02-27 DIAGNOSIS — I160 Hypertensive urgency: Secondary | ICD-10-CM | POA: Diagnosis not present

## 2022-02-27 DIAGNOSIS — E669 Obesity, unspecified: Secondary | ICD-10-CM | POA: Diagnosis present

## 2022-02-27 DIAGNOSIS — Z8673 Personal history of transient ischemic attack (TIA), and cerebral infarction without residual deficits: Secondary | ICD-10-CM | POA: Diagnosis not present

## 2022-02-27 DIAGNOSIS — E1122 Type 2 diabetes mellitus with diabetic chronic kidney disease: Secondary | ICD-10-CM | POA: Diagnosis present

## 2022-02-27 DIAGNOSIS — Z90710 Acquired absence of both cervix and uterus: Secondary | ICD-10-CM | POA: Diagnosis not present

## 2022-02-27 DIAGNOSIS — N1832 Chronic kidney disease, stage 3b: Secondary | ICD-10-CM | POA: Insufficient documentation

## 2022-02-27 DIAGNOSIS — E7800 Pure hypercholesterolemia, unspecified: Secondary | ICD-10-CM | POA: Diagnosis present

## 2022-02-27 DIAGNOSIS — Z515 Encounter for palliative care: Secondary | ICD-10-CM | POA: Diagnosis not present

## 2022-02-27 DIAGNOSIS — E861 Hypovolemia: Secondary | ICD-10-CM | POA: Diagnosis present

## 2022-02-27 DIAGNOSIS — I130 Hypertensive heart and chronic kidney disease with heart failure and stage 1 through stage 4 chronic kidney disease, or unspecified chronic kidney disease: Secondary | ICD-10-CM | POA: Diagnosis present

## 2022-02-27 DIAGNOSIS — I5032 Chronic diastolic (congestive) heart failure: Secondary | ICD-10-CM | POA: Diagnosis not present

## 2022-02-27 DIAGNOSIS — D631 Anemia in chronic kidney disease: Secondary | ICD-10-CM | POA: Diagnosis present

## 2022-02-27 DIAGNOSIS — I161 Hypertensive emergency: Secondary | ICD-10-CM

## 2022-02-27 DIAGNOSIS — K8020 Calculus of gallbladder without cholecystitis without obstruction: Secondary | ICD-10-CM | POA: Diagnosis present

## 2022-02-27 DIAGNOSIS — Z880 Allergy status to penicillin: Secondary | ICD-10-CM | POA: Diagnosis not present

## 2022-02-27 DIAGNOSIS — I509 Heart failure, unspecified: Secondary | ICD-10-CM

## 2022-02-27 DIAGNOSIS — Z7902 Long term (current) use of antithrombotics/antiplatelets: Secondary | ICD-10-CM | POA: Diagnosis not present

## 2022-02-27 DIAGNOSIS — Z1152 Encounter for screening for COVID-19: Secondary | ICD-10-CM | POA: Diagnosis not present

## 2022-02-27 DIAGNOSIS — R0602 Shortness of breath: Secondary | ICD-10-CM | POA: Diagnosis present

## 2022-02-27 DIAGNOSIS — J810 Acute pulmonary edema: Secondary | ICD-10-CM | POA: Diagnosis not present

## 2022-02-27 DIAGNOSIS — Z888 Allergy status to other drugs, medicaments and biological substances status: Secondary | ICD-10-CM | POA: Diagnosis not present

## 2022-02-27 DIAGNOSIS — J449 Chronic obstructive pulmonary disease, unspecified: Secondary | ICD-10-CM | POA: Diagnosis present

## 2022-02-27 DIAGNOSIS — S3210XA Unspecified fracture of sacrum, initial encounter for closed fracture: Secondary | ICD-10-CM | POA: Diagnosis present

## 2022-02-27 DIAGNOSIS — W19XXXA Unspecified fall, initial encounter: Secondary | ICD-10-CM | POA: Diagnosis present

## 2022-02-27 DIAGNOSIS — N1411 Contrast-induced nephropathy: Secondary | ICD-10-CM | POA: Diagnosis present

## 2022-02-27 DIAGNOSIS — Z79899 Other long term (current) drug therapy: Secondary | ICD-10-CM | POA: Diagnosis not present

## 2022-02-27 DIAGNOSIS — N179 Acute kidney failure, unspecified: Secondary | ICD-10-CM | POA: Diagnosis not present

## 2022-02-27 LAB — URINALYSIS, ROUTINE W REFLEX MICROSCOPIC
Bacteria, UA: NONE SEEN
Bilirubin Urine: NEGATIVE
Glucose, UA: NEGATIVE mg/dL
Hgb urine dipstick: NEGATIVE
Ketones, ur: NEGATIVE mg/dL
Leukocytes,Ua: NEGATIVE
Nitrite: NEGATIVE
Protein, ur: 30 mg/dL — AB
Specific Gravity, Urine: 1.02 (ref 1.005–1.030)
pH: 5 (ref 5.0–8.0)

## 2022-02-27 LAB — CBC
HCT: 26.4 % — ABNORMAL LOW (ref 36.0–46.0)
Hemoglobin: 9.1 g/dL — ABNORMAL LOW (ref 12.0–15.0)
MCH: 28.6 pg (ref 26.0–34.0)
MCHC: 34.5 g/dL (ref 30.0–36.0)
MCV: 83 fL (ref 80.0–100.0)
Platelets: 209 10*3/uL (ref 150–400)
RBC: 3.18 MIL/uL — ABNORMAL LOW (ref 3.87–5.11)
RDW: 13.5 % (ref 11.5–15.5)
WBC: 7 10*3/uL (ref 4.0–10.5)
nRBC: 0 % (ref 0.0–0.2)

## 2022-02-27 LAB — BRAIN NATRIURETIC PEPTIDE: B Natriuretic Peptide: 469.9 pg/mL — ABNORMAL HIGH (ref 0.0–100.0)

## 2022-02-27 LAB — ECHOCARDIOGRAM COMPLETE
AR max vel: 1.6 cm2
AV Peak grad: 14.6 mmHg
Ao pk vel: 1.91 m/s
Area-P 1/2: 2.21 cm2
Est EF: >75
S' Lateral: 3.1 cm

## 2022-02-27 LAB — BASIC METABOLIC PANEL
Anion gap: 10 (ref 5–15)
BUN: 28 mg/dL — ABNORMAL HIGH (ref 8–23)
CO2: 20 mmol/L — ABNORMAL LOW (ref 22–32)
Calcium: 8.2 mg/dL — ABNORMAL LOW (ref 8.9–10.3)
Chloride: 96 mmol/L — ABNORMAL LOW (ref 98–111)
Creatinine, Ser: 1.37 mg/dL — ABNORMAL HIGH (ref 0.44–1.00)
GFR, Estimated: 39 mL/min — ABNORMAL LOW (ref >60)
Glucose, Bld: 128 mg/dL — ABNORMAL HIGH (ref 70–99)
Potassium: 3.3 mmol/L — ABNORMAL LOW (ref 3.5–5.1)
Sodium: 126 mmol/L — ABNORMAL LOW (ref 135–145)

## 2022-02-27 LAB — CBG MONITORING, ED
Glucose-Capillary: 119 mg/dL — ABNORMAL HIGH (ref 70–99)
Glucose-Capillary: 161 mg/dL — ABNORMAL HIGH (ref 70–99)
Glucose-Capillary: 99 mg/dL (ref 70–99)

## 2022-02-27 LAB — GLUCOSE, CAPILLARY: Glucose-Capillary: 107 mg/dL — ABNORMAL HIGH (ref 70–99)

## 2022-02-27 MED ORDER — ACETAMINOPHEN 325 MG PO TABS: 650.0000 mg | ORAL_TABLET | Freq: Four times a day (QID) | ORAL | Status: DC | PRN

## 2022-02-27 MED ORDER — FUROSEMIDE 10 MG/ML IJ SOLN
40.0000 mg | Freq: Two times a day (BID) | INTRAMUSCULAR | Status: DC
  Administered 2022-02-27 – 2022-02-28 (×2): 40 mg via INTRAVENOUS
  Filled 2022-02-27 (×2): qty 4

## 2022-02-27 MED ORDER — CLONIDINE HCL 0.1 MG PO TABS
0.1000 mg | ORAL_TABLET | Freq: Once | ORAL | Status: AC
  Administered 2022-02-27: 0.1 mg via ORAL
  Filled 2022-02-27: qty 1

## 2022-02-27 MED ORDER — INSULIN ASPART 100 UNIT/ML IJ SOLN
0.0000 [IU] | Freq: Three times a day (TID) | INTRAMUSCULAR | Status: DC
  Administered 2022-02-27: 3 [IU] via SUBCUTANEOUS
  Administered 2022-02-28: 8 [IU] via SUBCUTANEOUS
  Administered 2022-02-28: 5 [IU] via SUBCUTANEOUS
  Administered 2022-03-01 (×2): 3 [IU] via SUBCUTANEOUS
  Filled 2022-02-27 (×5): qty 1

## 2022-02-27 MED ORDER — CLOPIDOGREL BISULFATE 75 MG PO TABS
75.0000 mg | ORAL_TABLET | Freq: Every day | ORAL | Status: DC
  Administered 2022-02-27 – 2022-03-02 (×4): 75 mg via ORAL
  Filled 2022-02-27 (×4): qty 1

## 2022-02-27 MED ORDER — ACETAMINOPHEN 650 MG RE SUPP: 650.0000 mg | Freq: Four times a day (QID) | RECTAL | Status: DC | PRN

## 2022-02-27 MED ORDER — ENOXAPARIN SODIUM 40 MG/0.4ML IJ SOSY
40.0000 mg | PREFILLED_SYRINGE | INTRAMUSCULAR | Status: DC
  Administered 2022-02-27 – 2022-02-28 (×2): 40 mg via SUBCUTANEOUS
  Filled 2022-02-27 (×2): qty 0.4

## 2022-02-27 MED ORDER — ONDANSETRON HCL 4 MG/2ML IJ SOLN
4.0000 mg | Freq: Four times a day (QID) | INTRAMUSCULAR | Status: DC | PRN
  Administered 2022-03-01 – 2022-03-02 (×2): 4 mg via INTRAVENOUS
  Filled 2022-02-27 (×2): qty 2

## 2022-02-27 MED ORDER — ATORVASTATIN CALCIUM 20 MG PO TABS
40.0000 mg | ORAL_TABLET | Freq: Every day | ORAL | Status: DC
  Administered 2022-02-27 – 2022-03-01 (×3): 40 mg via ORAL
  Filled 2022-02-27 (×3): qty 2

## 2022-02-27 MED ORDER — CARVEDILOL 25 MG PO TABS
25.0000 mg | ORAL_TABLET | Freq: Two times a day (BID) | ORAL | Status: DC
  Administered 2022-02-27 – 2022-03-02 (×7): 25 mg via ORAL
  Filled 2022-02-27 (×7): qty 1

## 2022-02-27 MED ORDER — ALBUTEROL SULFATE (2.5 MG/3ML) 0.083% IN NEBU: 2.5000 mg | INHALATION_SOLUTION | RESPIRATORY_TRACT | Status: DC | PRN

## 2022-02-27 MED ORDER — PANTOPRAZOLE SODIUM 40 MG PO TBEC
40.0000 mg | DELAYED_RELEASE_TABLET | Freq: Every day | ORAL | Status: DC
  Administered 2022-02-27 – 2022-03-02 (×4): 40 mg via ORAL
  Filled 2022-02-27 (×4): qty 1

## 2022-02-27 MED ORDER — INSULIN ASPART 100 UNIT/ML IJ SOLN: 0.0000 [IU] | Freq: Every day | INTRAMUSCULAR | Status: DC

## 2022-02-27 MED ORDER — LEVOTHYROXINE SODIUM 50 MCG PO TABS
75.0000 ug | ORAL_TABLET | Freq: Every day | ORAL | Status: DC
  Administered 2022-02-27 – 2022-03-01 (×3): 75 ug via ORAL
  Filled 2022-02-27 (×3): qty 1
  Filled 2022-02-27: qty 2

## 2022-02-27 MED ORDER — HYDRALAZINE HCL 50 MG PO TABS
100.0000 mg | ORAL_TABLET | Freq: Three times a day (TID) | ORAL | Status: DC
  Administered 2022-02-27 – 2022-03-02 (×9): 100 mg via ORAL
  Filled 2022-02-27 (×11): qty 2

## 2022-02-27 MED ORDER — ONDANSETRON HCL 4 MG PO TABS: 4.0000 mg | ORAL_TABLET | Freq: Four times a day (QID) | ORAL | Status: DC | PRN

## 2022-02-27 MED ORDER — HYDROCODONE-ACETAMINOPHEN 5-325 MG PO TABS
1.0000 | ORAL_TABLET | ORAL | Status: DC | PRN
  Administered 2022-03-01 (×2): 1 via ORAL
  Filled 2022-02-27 (×2): qty 1

## 2022-02-27 NOTE — Progress Notes (Signed)
  Echocardiogram 2D Echocardiogram has been performed.  Gina Kelly 02/27/2022, 9:25 AM

## 2022-02-27 NOTE — Assessment & Plan Note (Signed)
CT showed "Distended gallbladder with multiple stones layering in the gallbladder. Changes are nonspecific but could indicate acute cholecystitis in the appropriate clinical setting" Patient denies abdominal pain so low suspicion for acute cholecystitis

## 2022-02-27 NOTE — H&P (Signed)
History and Physical    Patient: Gina Kelly BZJ:[id number] DOB: 1940-08-14 DOA: 02/26/2022 DOS: the patient was seen and examined on 02/27/2022 PCP: [facility]  Patient coming from: Home  Chief Complaint:  Chief Complaint  Patient presents with   Hypertension    HPI: Gina Kelly is a 82 y.o. female with medical history significant for type 2 diabetes mellitus, CKD stage IIIb/IV, essential hypertension, hypercholesterolemia, history of chronic diarrhea, Hx CVA hospitalized from 12/30 to 01/26/2022 for worsening renal function secondary to Bactrim/HCTZ and uncontrolled hypertension, complicated by fluid overload requiring Lasix, who presents to the  EDWith complaints of headache, nausea and vomiting x 2 days with elevated blood pressure readings at home.  She has associated shortness of breath and, lower extremity swelling.  She denies cough, chest pain, fever or chills.  She also complained of continued tailbone pain since sustaining a fall earlier in the week. ED course and data review: On arrival blood pressure was elevated at 202/63 and she was tachypneic at 25 with O2 sat 88% on room air requiring 3 L to maintain sats in the mid 90s.  Vitals were otherwise unremarkable. Labs significant for normal WBC and negative respiratory viral panel.  Troponin 12, BNP pending.  Sodium was 126.  Creatinine at baseline at 1.42, hemoglobin at baseline at 10.2.  Urinalysis pending.  EKG, personally viewed and interpreted showed sinus rhythm at 74 with no acute ST-T wave changes. CTA chest significant for airspace and interstitial infiltrates in the lungs most likely representing edema, as well as cardiac enlargement with minimal pericardial effusion among other findings as detailed below: IMPRESSION: 1. No evidence of significant pulmonary embolus. 2. Airspace and interstitial infiltrates in the lungs most likely representing edema although multifocal pneumonia could potentially have this  appearance in the appropriate clinical setting. 3. Cardiac enlargement with minimal pericardial effusion. 4. Calcific and noncalcific plaque formation in the thoracic aorta without aneurysm or dissection. Suggestion of ulcerated plaque in the aortic arch. 5. Bilateral adrenal gland nodules are unchanged since prior study. Long-term stability suggests benign etiology and no imaging follow-up is indicated. 6. Distended gallbladder with multiple stones layering in the gallbladder. Changes are nonspecific but could indicate acute cholecystitis in the appropriate clinical setting  Patient also had an x-ray of the sacrum and coccyx that showed a nondisplaced sacral fracture at the S3-S4 level.  CT head was nonacute. Patient was treated with oral hydralazine and clonidine in the ED with improvement in BP to 188/57 by admission.  Hospitalist consulted for admission.   Review of Systems: As mentioned in the history of present illness. All other systems reviewed and are negative.  Past Medical History:  Diagnosis Date   Diabetes mellitus without complication (HCC)    Hypertension    Stroke (HCC)    2000   Past Surgical History:  Procedure Laterality Date   ABDOMINAL HYSTERECTOMY     Social History:  reports that she has never smoked. She has never used smokeless tobacco. She reports that she does not drink alcohol and does not use drugs.  Allergies  Allergen Reactions   Metformin Diarrhea    CKD   Penicillins     History reviewed. No pertinent family history.  Prior to Admission medications   Medication Sig Start Date End Date Taking? Authorizing Provider  acetaminophen (TYLENOL) 500 MG tablet Take 1,000 mg by mouth every 6 (six) hours as needed.    [provider]  albuterol (PROVENTIL HFA;VENTOLIN HFA) 108 (90 BASE)  MCG/ACT inhaler Inhale 2 puffs into the lungs every 6 (six) hours as needed for wheezing or shortness of breath. 07/28/14   Menshew, Dannielle Karvonen, PA-C   amLODipine (NORVASC) 10 MG tablet Take 10 mg by mouth daily.    [provider]  atorvastatin (LIPITOR) 40 MG tablet Take 40 mg by mouth daily.    [provider]  carvedilol (COREG) 25 MG tablet Take 25 mg by mouth 2 (two) times daily with a meal.    [provider]  cloNIDine (CATAPRES) 0.1 MG tablet Take 1 tablet (0.1 mg total) by mouth 2 (two) times daily. 01/26/22   Amin, Jeanella Flattery, MD  clopidogrel (PLAVIX) 75 MG tablet Take 75 mg by mouth daily.    [provider]  fluticasone (FLONASE) 50 MCG/ACT nasal spray Place 2 sprays into both nostrils daily as needed for allergies or rhinitis.    [provider]  glipiZIDE (GLUCOTROL XL) 5 MG 24 hr tablet Take 5 mg by mouth 2 (two) times daily with a meal.    [provider]  hydrALAZINE (APRESOLINE) 100 MG tablet Take 1 tablet (100 mg total) by mouth every 8 (eight) hours. 01/26/22 02/25/22  Damita Lack, MD  levothyroxine (SYNTHROID) 75 MCG tablet Take 75 mcg by mouth daily before breakfast.    [provider]  omeprazole (PRILOSEC) 20 MG capsule Take 20 mg by mouth daily as needed (acid reflux symptoms).    [provider]  Semaglutide, 1 MG/DOSE, (OZEMPIC, 1 MG/DOSE,) 4 MG/3ML SOPN Inject 1 mg into the skin once a week. (Unless blood glucose reading had been trending low)    [provider]    Physical Exam: Vitals:   02/27/22 0015 02/27/22 0030 02/27/22 0045 02/27/22 0100  BP: (!) 177/56 (!) 177/54 (!) 184/54 (!) 188/57  Pulse: 72 74 73 76  Resp: 18 19 20  (!) 22  Temp:      TempSrc:      SpO2: 97% 96% 97% 98%   Physical Exam Vitals and nursing note reviewed.  Constitutional:      General: She is not in acute distress.    Interventions: Nasal cannula in place.     Comments: Conversational dyspnea  HENT:     Head: Normocephalic and atraumatic.  Cardiovascular:     Rate and Rhythm: Normal rate and regular rhythm.     Heart sounds: Normal heart  sounds.  Pulmonary:     Effort: Tachypnea present.     Breath sounds: Rales present.  Abdominal:     Palpations: Abdomen is soft.     Tenderness: There is no abdominal tenderness.  Musculoskeletal:     Right lower leg: Edema present.     Left lower leg: Edema present.  Neurological:     Mental Status: Mental status is at baseline.     Labs on Admission: I have personally reviewed following labs and imaging studies  CBC: Recent Labs  Lab 02/26/22 1629  WBC 7.1  HGB 10.2*  HCT 28.9*  MCV 82.1  PLT 390   Basic Metabolic Panel: Recent Labs  Lab 02/26/22 1629  NA 126*  K 3.8  CL 96*  CO2 18*  GLUCOSE 168*  BUN 28*  CREATININE 1.42*  CALCIUM 8.5*   GFR: CrCl cannot be calculated (Unknown ideal weight.). Liver Function Tests: Recent Labs  Lab 02/26/22 1629  AST 18  ALT 13  ALKPHOS 66  BILITOT 1.0  PROT 7.0  ALBUMIN 3.8   Recent Labs  Lab 02/26/22 1629  LIPASE 43   No results for input(s): "AMMONIA" in the last 168 hours. Coagulation Profile: No results for input(s): "INR", "PROTIME" in the last 168 hours. Cardiac Enzymes: No results for input(s): "CKTOTAL", "CKMB", "CKMBINDEX", "TROPONINI" in the last 168 hours. BNP (last 3 results) No results for input(s): "PROBNP" in the last 8760 hours. HbA1C: No results for input(s): "HGBA1C" in the last 72 hours. CBG: No results for input(s): "GLUCAP" in the last 168 hours. Lipid Profile: No results for input(s): "CHOL", "HDL", "LDLCALC", "TRIG", "CHOLHDL", "LDLDIRECT" in the last 72 hours. Thyroid Function Tests: No results for input(s): "TSH", "T4TOTAL", "FREET4", "T3FREE", "THYROIDAB" in the last 72 hours. Anemia Panel: No results for input(s): "VITAMINB12", "FOLATE", "FERRITIN", "TIBC", "IRON", "RETICCTPCT" in the last 72 hours. Urine analysis:    Component Value Date/Time   COLORURINE YELLOW (A) 01/22/2022 0200   APPEARANCEUR CLEAR (A) 01/22/2022 0200   APPEARANCEUR Cloudy 10/21/2011 0239   LABSPEC  1.011 01/22/2022 0200   LABSPEC 1.014 10/21/2011 0239   PHURINE 5.0 01/22/2022 0200   GLUCOSEU NEGATIVE 01/22/2022 0200   GLUCOSEU >=500 10/21/2011 0239   HGBUR NEGATIVE 01/22/2022 0200   BILIRUBINUR NEGATIVE 01/22/2022 0200   BILIRUBINUR negative 01/14/2022 1717   BILIRUBINUR Negative 10/21/2011 0239   KETONESUR NEGATIVE 01/22/2022 0200   PROTEINUR NEGATIVE 01/22/2022 0200   UROBILINOGEN 0.2 01/14/2022 1717   NITRITE NEGATIVE 01/22/2022 0200   LEUKOCYTESUR SMALL (A) 01/22/2022 0200   LEUKOCYTESUR Negative 10/21/2011 0239    Radiological Exams on Admission: CT Angio Chest PE W/Cm &/Or Wo Cm  Result Date: 02/26/2022 CLINICAL DATA:  Pulmonary embolus suspected with high probability. Vomiting starting today. Bilateral hip pain after a fall. EXAM: CT ANGIOGRAPHY CHEST WITH CONTRAST TECHNIQUE: Multidetector CT imaging of the chest was performed using the standard protocol during bolus administration of intravenous contrast. Multiplanar CT image reconstructions and MIPs were obtained to evaluate the vascular anatomy. RADIATION DOSE REDUCTION: This exam was performed according to the departmental dose-optimization program which includes automated exposure control, adjustment of the mA and/or kV according to patient size and/or use of iterative reconstruction technique. CONTRAST:  71mL OMNIPAQUE IOHEXOL 350 MG/ML SOLN COMPARISON:  Chest radiograph 02/26/2022.  CT 03/17/2011 FINDINGS: Cardiovascular: There is good opacification of the central and segmental pulmonary arteries. No focal filling defects. No evidence of significant pulmonary embolus. Mild cardiac enlargement. Trace pericardial effusion. Normal caliber thoracic aorta. No aortic dissection. There is calcific and noncalcific plaque formation involving the aortic arch and descending aorta. Areas of plaque ulceration are suggested in the aortic arch region. Calcification of the aorta, coronary arteries, and mitral valve annulus. Mediastinum/Nodes:  Thyroid gland is unremarkable. Esophagus is decompressed. Small esophageal hiatal hernia. Mediastinal lymph nodes are not pathologically enlarged, likely reactive. Lungs/Pleura: Trace bilateral pleural effusions. Mild peripheral interstitial infiltration with scattered ground-glass changes, likely mild edema. Pneumonia possible but less likely with this pattern. Atelectasis in the lung bases. Upper Abdomen: 1.5 cm diameter right adrenal gland nodule. Density measurements are 40 Hounsfield units, noncontributory on contrast-enhanced scan. Left adrenal gland nodule measuring 1.4 cm diameter. Density measurements are 115 Hounsfield units, likely indicating calcification. Nodules are unchanged since prior study. No imaging follow-up is indicated. The gallbladder is distended with multiple layering stones or milk of calcium. No wall thickening or bile duct dilatation. Musculoskeletal: Degenerative changes in the spine. Review of the MIP images confirms the above findings. IMPRESSION: 1. No evidence of significant pulmonary embolus. 2. Airspace and interstitial infiltrates in the lungs most likely representing  edema although multifocal pneumonia could potentially have this appearance in the appropriate clinical setting. 3. Cardiac enlargement with minimal pericardial effusion. 4. Calcific and noncalcific plaque formation in the thoracic aorta without aneurysm or dissection. Suggestion of ulcerated plaque in the aortic arch. 5. Bilateral adrenal gland nodules are unchanged since prior study. Long-term stability suggests benign etiology and no imaging follow-up is indicated. 6. Distended gallbladder with multiple stones layering in the gallbladder. Changes are nonspecific but could indicate acute cholecystitis in the appropriate clinical setting. Electronically Signed   By: Burman Nieves M.D.   On: 02/26/2022 23:50   DG Chest 2 View  Result Date: 02/26/2022 CLINICAL DATA:  Cough, hypertension, vomiting.  Fell last  week. EXAM: CHEST - 2 VIEW COMPARISON:  01/24/2022 FINDINGS: Slightly shallow inspiration. Heart size and pulmonary vascularity are normal for technique. Lungs are clear. No pleural effusions. No pneumothorax. Mediastinal contours appear intact. Calcification of the aorta. Degenerative changes in the spine and shoulders. IMPRESSION: Shallow inspiration.  No evidence of active pulmonary disease. Electronically Signed   By: Burman Nieves M.D.   On: 02/26/2022 20:46   DG Sacrum/Coccyx  Result Date: 02/26/2022 CLINICAL DATA:  Bilateral hip pain after a fall last week. EXAM: SACRUM AND COCCYX - 2+ VIEW COMPARISON:  None Available. FINDINGS: Diffuse bone demineralization. There appears to be cortical irregularity suggesting nondisplaced fracture at the S3/S4 level of the sacrum. No significant displacement of fracture fragments. Sacral struts appear intact. Alignment is normal. SI joints and symphysis pubis are not displaced. IMPRESSION: Nondisplaced sacral fractures at the S3/S4 level. Electronically Signed   By: Burman Nieves M.D.   On: 02/26/2022 20:45   DG Hips Bilat W or Wo Pelvis 2 Views  Result Date: 02/26/2022 CLINICAL DATA:  Cough. Hypertension and vomiting. Bilateral hip pain after a fall last week. EXAM: DG HIP (WITH OR WITHOUT PELVIS) 2V BILAT COMPARISON:  CT 03/19/2014 FINDINGS: Degenerative changes demonstrated in the lower lumbar spine and both hips with symmetrical appearance. Pelvis and hips appear intact. No evidence of acute fracture or dislocation. No focal bone lesions. Visualized sacrum appears intact. SI joints and symphysis pubis are not displaced. Vascular calcifications. Calcified phleboliths in the pelvis. Surgical clips in the left pelvis. IMPRESSION: Degenerative changes in the lower lumbar spine and hips. No acute displaced fractures are identified. Electronically Signed   By: Burman Nieves M.D.   On: 02/26/2022 20:44   CT Head Wo Contrast  Result Date: 02/26/2022 CLINICAL  DATA:  Headache, hypertension, vomiting EXAM: CT HEAD WITHOUT CONTRAST TECHNIQUE: Contiguous axial images were obtained from the base of the skull through the vertex without intravenous contrast. RADIATION DOSE REDUCTION: This exam was performed according to the departmental dose-optimization program which includes automated exposure control, adjustment of the mA and/or kV according to patient size and/or use of iterative reconstruction technique. COMPARISON:  02/17/2022 FINDINGS: Brain: Stable chronic small-vessel ischemic changes throughout the periventricular white matter. Stable encephalomalacia from prior right frontal cortical infarct. No evidence of acute infarct or hemorrhage. Lateral ventricles and midline structures are stable. Calcification of the bilateral basal ganglia unchanged. No acute extra-axial fluid collections. No mass effect. Vascular: Stable atherosclerosis.  No hyperdense vessel. Skull: Normal. Negative for fracture or focal lesion. Sinuses/Orbits: No acute finding. Other: None. IMPRESSION: 1. Stable head CT, no acute intracranial process. Electronically Signed   By: Sharlet Salina M.D.   On: 02/26/2022 19:58     Data Reviewed: Relevant notes from primary care and specialist visits, past discharge summaries as  available in EHR, including Care Everywhere. Prior diagnostic testing as pertinent to current admission diagnoses Updated medications and problem lists for reconciliation ED course, including vitals, labs, imaging, treatment and response to treatment Triage notes, nursing and pharmacy notes and ED provider's notes Notable results as noted in HPI   Assessment and Plan: Hypertensive emergency Acute CHF Acute respiratory failure with hypoxia Patient presents with respiratory distress and O2 sats 88% on room air, SBP over 200 CTA chest showing airspace and interstitial infiltrates likely representing edema plus or minus multifocal pneumonia Respiratory viral panel  negative, WBC normal so low suspicion for pneumonia IV Lasix and continue home carvedilol Oral hydralazine 3 times daily and as needed IV to help with BP control Will avoid clonidine to prevent rebound HTN Get BNP Echocardiogram in the a.m. Daily weights with intake and output monitoring  Closed sacral fracture, resulting from fall 02/17/22 ([place]) Pain control PT eval  Hyponatremia Likely hypervolemic related to CHF  Diabetes mellitus, type II (HCC) Sliding scale insulin coverage  Anemia in chronic kidney disease Hemoglobin at baseline at 10.2  Stage 3b chronic kidney disease ([place]) Renal function at baseline  Cholelithiasis CT showed "Distended gallbladder with multiple stones layering in the gallbladder. Changes are nonspecific but could indicate acute cholecystitis in the appropriate clinical setting" Patient denies abdominal pain so low suspicion for acute cholecystitis  History of CVA (cerebrovascular accident) Continue clopidogrel and atorvastatin        DVT prophylaxis: Lovenox  Consults: none  Advance Care Planning:   Code Status: Prior   Family Communication: none  Disposition Plan: Back to previous home environment  Severity of Illness: The appropriate patient status for this patient is INPATIENT. Inpatient status is judged to be reasonable and necessary in order to provide the required intensity of service to ensure the patient's safety. The patient's presenting symptoms, physical exam findings, and initial radiographic and laboratory data in the context of their chronic comorbidities is felt to place them at high risk for further clinical deterioration. Furthermore, it is not anticipated that the patient will be medically stable for discharge from the hospital within 2 midnights of admission.   * I certify that at the point of admission it is my clinical judgment that the patient will require inpatient hospital care spanning beyond 2 midnights from the point  of admission due to high intensity of service, high risk for further deterioration and high frequency of surveillance required.*  Author: Athena Masse, MD 02/27/2022 1:22 AM  For on call review [email or web address].

## 2022-02-27 NOTE — Assessment & Plan Note (Signed)
Renal function at baseline 

## 2022-02-27 NOTE — Assessment & Plan Note (Addendum)
Acute CHF Acute respiratory failure with hypoxia Patient presents with respiratory distress and O2 sats 88% on room air, SBP over 200 CTA chest showing airspace and interstitial infiltrates likely representing edema plus or minus multifocal pneumonia Respiratory viral panel negative, WBC normal so low suspicion for pneumonia IV Lasix and continue home carvedilol Oral hydralazine 3 times daily and as needed IV to help with BP control Will avoid clonidine to prevent rebound HTN Get BNP Echocardiogram in the a.m. Daily weights with intake and output monitoring

## 2022-02-27 NOTE — Assessment & Plan Note (Signed)
Likely hypervolemic related to CHF 

## 2022-02-27 NOTE — Assessment & Plan Note (Signed)
Hemoglobin at baseline at 10.2

## 2022-02-27 NOTE — Assessment & Plan Note (Signed)
Pain control °PT eval °

## 2022-02-27 NOTE — Assessment & Plan Note (Signed)
Continue clopidogrel and atorvastatin

## 2022-02-27 NOTE — Assessment & Plan Note (Signed)
Sliding scale insulin coverage 

## 2022-02-27 NOTE — TOC Initial Note (Signed)
Transition of Care [facility]) - Initial/Assessment Note    Patient Details  Name: Gina Kelly MRN: [id number] Date of Birth: [birth year]  [facility] CM/SW Contact:    Gerilyn Pilgrim, LCSW Phone Number: 02/27/2022, 10:09 AM  Clinical Narrative:  Pt active with [facility]. Spoke with [place] at [facility] pt is active with PT. CSW asked if RN can be added. [facility] agreeable. Pt has transportation provided through her daughter Thelda. Pt using [facility] for pharmacy needs. Pt has a rollator and RW that she uses at home.                   Expected Discharge Plan: [place] Barriers to Discharge: Continued Medical Work up   Patient Goals and CMS Choice   CMS Medicare.gov Compare Post Acute Care list provided to:: Patient        Expected Discharge Plan and Services                                     [facility] Agency: [place] Date [place]: 02/27/22 Time HH Agency Contacted: 0900 Representative spoke with at [place]: [place]  Prior Living Arrangements/Services     Patient language and need for interpreter reviewed:: Yes Do you feel safe going back to the place where you live?: Yes      Need for Family Participation in Patient Care: Yes (Comment) Care giver support system in place?: Yes (comment)      Activities of Daily Living      Permission Sought/Granted                  Emotional Assessment              Admission diagnosis:  Acute respiratory failure with hypoxia ([place]) [J96.01] Patient Active Problem List   Diagnosis Date Noted   Acute respiratory failure with hypoxia ([place]) 02/27/2022   Cholelithiasis 02/27/2022   Hypertensive emergency 02/27/2022   Hyponatremia 02/27/2022   Stage 3b chronic kidney disease ([place]) 02/27/2022   Acute CHF (congestive heart failure) ([place]) 02/27/2022   Closed sacral fracture, resulting from fall 02/17/22 ([place]) 02/27/2022   Anemia in chronic kidney disease  02/07/2022   Acute kidney injury superimposed on chronic kidney disease ([place]) 01/22/2022   Hyperkalemia 01/22/2022   Essential hypertension 01/22/2022   Diabetes mellitus, type II ([place]) 01/22/2022   Dyslipidemia 01/22/2022   History of CVA (cerebrovascular accident) 01/22/2022   PCP:  [place]:   [place], [place]. [place]  [address] Phone: [phone] Fax: [phone]  [id number] - [place], [place] - [address] [place] [address] Phone: [phone] Fax: [phone]     Social Determinants of Health (SDOH) Social History: [place]: No Food Insecurity (01/22/2022)  Housing: Low Risk  (01/22/2022)  Transportation Needs: No Transportation Needs (01/22/2022)  Utilities: Not At Risk (01/22/2022)  Tobacco Use: Low Risk  (02/26/2022)   SDOH Interventions:     Readmission Risk Interventions    01/23/2022   12:42 PM  Readmission Risk Prevention Plan  Transportation Screening Complete  PCP or Specialist Appt within 5-7 Days Complete  Home Care Screening Complete  Medication Review (RN CM) Complete

## 2022-02-27 NOTE — Plan of Care (Signed)
Patient seen and examined admitted this morning.  History and physical along with assessment and plan reviewed.  Patient symptomatically feeling better at the time of my evaluation with trace edema lower extremities continues to stay on 2 L nasal cannula with normal saturations..    Will continue managing the patient with assessment and plan as per H&P.

## 2022-02-28 DIAGNOSIS — J810 Acute pulmonary edema: Secondary | ICD-10-CM | POA: Diagnosis not present

## 2022-02-28 DIAGNOSIS — I5032 Chronic diastolic (congestive) heart failure: Secondary | ICD-10-CM

## 2022-02-28 DIAGNOSIS — N189 Chronic kidney disease, unspecified: Secondary | ICD-10-CM

## 2022-02-28 DIAGNOSIS — I160 Hypertensive urgency: Secondary | ICD-10-CM | POA: Diagnosis not present

## 2022-02-28 DIAGNOSIS — R0602 Shortness of breath: Secondary | ICD-10-CM

## 2022-02-28 DIAGNOSIS — N1832 Chronic kidney disease, stage 3b: Secondary | ICD-10-CM

## 2022-02-28 DIAGNOSIS — S3210XA Unspecified fracture of sacrum, initial encounter for closed fracture: Secondary | ICD-10-CM

## 2022-02-28 DIAGNOSIS — J9601 Acute respiratory failure with hypoxia: Secondary | ICD-10-CM | POA: Diagnosis not present

## 2022-02-28 LAB — GLUCOSE, CAPILLARY
Glucose-Capillary: 71 mg/dL (ref 70–99)
Glucose-Capillary: 252 mg/dL — ABNORMAL HIGH (ref 70–99)
Glucose-Capillary: 205 mg/dL — ABNORMAL HIGH (ref 70–99)

## 2022-02-28 LAB — COMPREHENSIVE METABOLIC PANEL
ALT: 12 U/L (ref 0–44)
AST: 17 U/L (ref 15–41)
Albumin: 3.5 g/dL (ref 3.5–5.0)
Alkaline Phosphatase: 62 U/L (ref 38–126)
Anion gap: 12 (ref 5–15)
BUN: 45 mg/dL — ABNORMAL HIGH (ref 8–23)
CO2: 19 mmol/L — ABNORMAL LOW (ref 22–32)
Calcium: 8.2 mg/dL — ABNORMAL LOW (ref 8.9–10.3)
Chloride: 95 mmol/L — ABNORMAL LOW (ref 98–111)
Creatinine, Ser: 2.63 mg/dL — ABNORMAL HIGH (ref 0.44–1.00)
GFR, Estimated: 18 mL/min — ABNORMAL LOW (ref >60)
Glucose, Bld: 98 mg/dL (ref 70–99)
Potassium: 3.9 mmol/L (ref 3.5–5.1)
Sodium: 126 mmol/L — ABNORMAL LOW (ref 135–145)
Total Bilirubin: 0.9 mg/dL (ref 0.3–1.2)
Total Protein: 6.4 g/dL — ABNORMAL LOW (ref 6.5–8.1)

## 2022-02-28 LAB — CBC
HCT: 27.2 % — ABNORMAL LOW (ref 36.0–46.0)
Hemoglobin: 9.5 g/dL — ABNORMAL LOW (ref 12.0–15.0)
MCH: 28.8 pg (ref 26.0–34.0)
MCHC: 34.9 g/dL (ref 30.0–36.0)
MCV: 82.4 fL (ref 80.0–100.0)
Platelets: 222 10*3/uL (ref 150–400)
RBC: 3.3 MIL/uL — ABNORMAL LOW (ref 3.87–5.11)
RDW: 13.5 % (ref 11.5–15.5)
WBC: 7.3 10*3/uL (ref 4.0–10.5)
nRBC: 0 % (ref 0.0–0.2)

## 2022-02-28 MED ORDER — ENOXAPARIN SODIUM 30 MG/0.3ML IJ SOSY
30.0000 mg | PREFILLED_SYRINGE | INTRAMUSCULAR | Status: DC
  Administered 2022-03-01: 30 mg via SUBCUTANEOUS
  Filled 2022-02-28: qty 0.3

## 2022-02-28 MED ORDER — SODIUM CHLORIDE 1 G PO TABS
1.0000 g | ORAL_TABLET | Freq: Two times a day (BID) | ORAL | Status: DC
  Administered 2022-02-28 – 2022-03-01 (×3): 1 g via ORAL
  Filled 2022-02-28 (×3): qty 1

## 2022-02-28 MED ORDER — CLONIDINE HCL 0.1 MG PO TABS
0.1000 mg | ORAL_TABLET | Freq: Two times a day (BID) | ORAL | Status: DC
  Administered 2022-02-28 – 2022-03-02 (×5): 0.1 mg via ORAL
  Filled 2022-02-28 (×5): qty 1

## 2022-02-28 MED ORDER — AMLODIPINE BESYLATE 10 MG PO TABS
10.0000 mg | ORAL_TABLET | Freq: Every day | ORAL | Status: DC
  Administered 2022-02-28 – 2022-03-01 (×2): 10 mg via ORAL
  Filled 2022-02-28 (×2): qty 1

## 2022-02-28 NOTE — Progress Notes (Addendum)
PROGRESS NOTE  Gina Kelly    DOB: 05-Apr-1940, 82 y.o.  TSV:[id number]    Code Status: Full Code   DOA: 02/26/2022   LOS: 1   Brief hospital course  Gina Kelly is a 82 y.o. female with a PMH significant for type 2 diabetes mellitus, CKD stage IIIb/IV, essential hypertension, hypercholesterolemia, history of chronic diarrhea, Hx CVA hospitalized from 12/30 to 01/26/2022 for worsening renal function secondary to Bactrim/HCTZ and uncontrolled hypertension, complicated by fluid overload requiring Lasix.  They presented from home to the ED on 02/26/2022 with headache, nausea and vomiting  x 2 days. In addition, she complained of tailbone pain since fall last week and elevated blood pressure readings.   In the ED, it was found that they had hypertension with 202/63 and she was tachypneic at 25 with O2 sat 88% on room air requiring 3 L to maintain sats in the mid 90s. Significant findings included normal WBC and negative respiratory viral panel.  Troponin 12, BNP pending.  Sodium was 126.  Creatinine at baseline at 1.42, hemoglobin at baseline at 10.2.  Urinalysis pending.  EKG, personally viewed and interpreted showed sinus rhythm at 74 with no acute ST-T wave changes. CTA chest significant for airspace and interstitial infiltrates in the lungs most likely representing edema, as well as cardiac enlargement with minimal pericardial effusion among other findings as detailed below: IMPRESSION: 1. No evidence of significant pulmonary embolus. 2. Airspace and interstitial infiltrates in the lungs most likely representing edema although multifocal pneumonia could potentially have this appearance in the appropriate clinical setting. 3. Cardiac enlargement with minimal pericardial effusion. 4. Calcific and noncalcific plaque formation in the thoracic aorta without aneurysm or dissection. Suggestion of ulcerated plaque in the aortic arch. 5. Bilateral adrenal gland nodules are unchanged since prior  study. Long-term stability suggests benign etiology and no imaging follow-up is indicated. 6. Distended gallbladder with multiple stones layering in the gallbladder. Changes are nonspecific but could indicate acute cholecystitis in the appropriate clinical setting   Patient also had an x-ray of the sacrum and coccyx that showed a nondisplaced sacral fracture at the S3-S4 level.  CT head was nonacute.  They were initially treated with oral hydralazine and clonidine in the ED with improvement in BP to 188/57 by admission.   Patient was admitted to medicine service for further workup and management of hypertensive emergency and HF exacerbation as outlined in detail below.  02/28/22 -stable, mild improvement  Assessment & Plan  Principal Problem:   Acute respiratory failure with hypoxia [facility] Active Problems:   Hypertensive emergency   Acute CHF (congestive heart failure) (HCC)   Closed sacral fracture, resulting from fall 02/17/22 ([place])   Hyponatremia   Diabetes mellitus, type II ([place])   History of CVA (cerebrovascular accident)   Cholelithiasis   Stage 3b chronic kidney disease ([place])   Anemia in chronic kidney disease  Hypertensive emergency- much improved since admission. Systolics now around 923R. End organ damage evident in kidney and heart dysfunction - continue home meds including clonidine, amlodipine, add hydralazine  Acute CHF exacerbation  Acute respiratory failure with hypoxia- currently on 1.5L [place]. No oxygen at baseline.  EF this admission >75% with severe asymmetric left ventricular hypertrophy of basal-septal segment.  CTA chest showing airspace and interstitial infiltrates likely representing edema plus or minus multifocal pneumonia Respiratory viral panel negative, WBC normal so low suspicion for pneumonia - IV Lasix given on admission and will be given PRN to avoid renal injury - continue home  carvedilol - Daily weights with intake and output monitoring - wean to  room air as tolerated - PT/OT - cardiology, Dr. Fletcher Anon consulted, appreciate your recs   Closed sacral fracture, resulting from fall 02/17/22 ([place]) Pain control PRN PT eval   Hyponatremia- stable around 126.  Likely hypervolemic related to CHF and diuresis - continue to monitor. Consider light IV fluids along with diuretics - started on salt tablets to avoid fluid overload   Diabetes mellitus, type II (HCC) Sliding scale insulin coverage   Anemia in chronic kidney disease Hemoglobin at baseline at 10.2   AKI on Stage 3b chronic kidney disease (HCC) Cr worsened with diuresis Cr 1.37>2.63 - BMP am   Cholelithiasis CT showed "Distended gallbladder with multiple stones layering in the gallbladder. Changes are nonspecific but could indicate acute cholecystitis in the appropriate clinical setting" Patient denies abdominal pain so low suspicion for acute cholecystitis   History of CVA (cerebrovascular accident) Continue clopidogrel and atorvastatin  Body mass index is 23.83 kg/m.  VTE ppx: enoxaparin (LOVENOX) injection 40 mg Start: 02/27/22 1000  Diet:     Diet   Diet heart healthy/carb modified Room service appropriate? Yes; Fluid consistency: Thin   Consultants: Cardiology   Subjective 02/28/22    Pt reports feeling improved. She denies SOB at rest. Her sacral pain is well controlled. States that she feels that she needs to have a BM now.  She states that her urine output has been minimal overnight.   3 children at bedside during rounds. Questions and concerns addressed at time of encounter.   Objective   Vitals:   02/27/22 2351 02/28/22 0259 02/28/22 0500 02/28/22 0724  BP: (!) 160/40 (!) 167/41  (!) 182/39  Pulse: 63 64  65  Resp: 16 18  18   Temp: 98.6 F (37 C) 98.3 F (36.8 C)  98.2 F (36.8 C)  TempSrc: Oral Oral    SpO2: 95% 97%  95%  Weight:   59.1 kg     Intake/Output Summary (Last 24 hours) at 02/28/2022 6301 Last data filed at 02/27/2022 2008 Gross  per 24 hour  Intake --  Output 200 ml  Net -200 ml   Filed Weights   02/28/22 0500  Weight: 59.1 kg     Physical Exam:  General: awake, alert, NAD HEENT: atraumatic, clear conjunctiva, anicteric sclera, MMM, hearing grossly normal Respiratory: normal respiratory effort. CTAB with mild crackles at bilateral bases Cardiovascular: quick capillary refill, normal S1/S2, RRR, no JVD, murmurs Gastrointestinal: soft, NT, ND Nervous: A&O x3. no gross focal neurologic deficits, normal speech Extremities: moves all equally, no edema, normal tone Skin: dry, intact, normal temperature, normal color. No rashes, lesions or ulcers on exposed skin Psychiatry: normal mood, congruent affect  Labs   I have personally reviewed the following labs and imaging studies CBC    Component Value Date/Time   WBC 7.0 02/27/2022 0335   RBC 3.18 (L) 02/27/2022 0335   HGB 9.1 (L) 02/27/2022 0335   HGB 13.0 07/27/2013 2235   HCT 26.4 (L) 02/27/2022 0335   HCT 37.2 07/27/2013 2235   PLT 209 02/27/2022 0335   PLT 177 07/27/2013 2235   MCV 83.0 02/27/2022 0335   MCV 83 07/27/2013 2235   MCH 28.6 02/27/2022 0335   MCHC 34.5 02/27/2022 0335   RDW 13.5 02/27/2022 0335   RDW 15.2 (H) 07/27/2013 2235   LYMPHSABS 2.8 05/18/2016 2314   LYMPHSABS 2.1 12/10/2011 0219   MONOABS 0.5 05/18/2016 2314   MONOABS 0.3  12/10/2011 0219   EOSABS 0.3 05/18/2016 2314   EOSABS 0.5 12/10/2011 0219   BASOSABS 0.1 05/18/2016 2314   BASOSABS 0.1 12/10/2011 0219   BASOSABS 1 12/10/2011 0219      Latest Ref Rng & Units 02/27/2022    3:35 AM 02/26/2022    4:29 PM 01/26/2022    4:22 AM  BMP  Glucose 70 - 99 mg/dL 696  789  381   BUN 8 - 23 mg/dL 28  28  51   Creatinine 0.44 - 1.00 mg/dL 0.17  5.10  2.58   Sodium 135 - 145 mmol/L 126  126  136   Potassium 3.5 - 5.1 mmol/L 3.3  3.8  4.4   Chloride 98 - 111 mmol/L 96  96  110   CO2 22 - 32 mmol/L 20  18  19    Calcium 8.9 - 10.3 mg/dL 8.2  8.5  8.0     ECHOCARDIOGRAM  COMPLETE  Result Date: 02/27/2022    ECHOCARDIOGRAM REPORT   Patient Name:   MARLICIA SROKA Date of Exam: 02/27/2022 Medical Rec #:  04/28/2022    Height:       62.0 in Accession #:    [id number]   Weight:       140.0 lb Date of Birth:  [birth year]    BSA:          1.643 m Patient Age:    81 years     BP:           175/56 mmHg Patient Gender: F            HR:           66 bpm. Exam Location:  [facility] Procedure: 2D Echo Indications:     CHF I50.31  History:         Patient has no prior history of Echocardiogram examinations.  Sonographer:     10/18/1940 RDCS Referring Phys:  [place] Mam [id number] Diagnosing Phys: Andris Baumann MD IMPRESSIONS  1. Left ventricular ejection fraction, by estimation, is >75%. The left ventricle has hyperdynamic function. The left ventricle has no regional wall motion abnormalities. There is severe asymmetric left ventricular hypertrophy of the basal-septal segment. Left ventricular diastolic function could not be evaluated.  2. Right ventricular systolic function is normal. The right ventricular size is normal. There is mildly elevated pulmonary artery systolic pressure. The estimated right ventricular systolic pressure is 36.6 mmHg.  3. Left atrial size was moderately dilated.  4. The mitral valve is grossly normal. Trivial mitral valve regurgitation. No evidence of mitral stenosis. Moderate mitral annular calcification.  5. The aortic valve is tricuspid. There is mild calcification of the aortic valve. There is mild thickening of the aortic valve. Aortic valve regurgitation is not visualized. Aortic valve sclerosis is present, with no evidence of aortic valve stenosis.  6. The inferior vena cava is normal in size with greater than 50% respiratory variability, suggesting right atrial pressure of 3 mmHg. Comparison(s): No prior Echocardiogram. Conclusion(s)/Recommendation(s): Hyperdynamic LVEF with severe basal septal hypertrophy but no echo evidence of obstruction. Normal right  atrial pressure of 3 mmHg with mildly elevated PASP. FINDINGS  Left Ventricle: LVOT with turbulent blood flow but no clear systolic anterior motion of the mitral valve. Peak LV gradient 19 mmHg. Left ventricular ejection fraction, by estimation, is >75%. The left ventricle has hyperdynamic function. The left ventricle has no regional wall motion abnormalities. The left ventricular internal cavity size was normal in size. There  is severe asymmetric left ventricular hypertrophy of the basal-septal segment. Left ventricular diastolic function could not be evaluated due to atrial fibrillation. Left ventricular diastolic function could not be evaluated. Right Ventricle: The right ventricular size is normal. No increase in right ventricular wall thickness. Right ventricular systolic function is normal. There is mildly elevated pulmonary artery systolic pressure. The tricuspid regurgitant velocity is 2.90  m/s, and with an assumed right atrial pressure of 3 mmHg, the estimated right ventricular systolic pressure is 36.6 mmHg. Left Atrium: Left atrial size was moderately dilated. Right Atrium: Right atrial size was normal in size. Pericardium: There is no evidence of pericardial effusion. Mitral Valve: The mitral valve is grossly normal. Moderate mitral annular calcification. Trivial mitral valve regurgitation. No evidence of mitral valve stenosis. Tricuspid Valve: The tricuspid valve is normal in structure. Tricuspid valve regurgitation is mild . No evidence of tricuspid stenosis. Aortic Valve: The aortic valve is tricuspid. There is mild calcification of the aortic valve. There is mild thickening of the aortic valve. Aortic valve regurgitation is not visualized. Aortic valve sclerosis is present, with no evidence of aortic valve stenosis. Aortic valve peak gradient measures 14.6 mmHg. Pulmonic Valve: The pulmonic valve was grossly normal. Pulmonic valve regurgitation is trivial. No evidence of pulmonic stenosis. Aorta:  The aortic root, ascending aorta, aortic arch and descending aorta are all structurally normal, with no evidence of dilitation or obstruction. Venous: The inferior vena cava is normal in size with greater than 50% respiratory variability, suggesting right atrial pressure of 3 mmHg. IAS/Shunts: The atrial septum is grossly normal.  LEFT VENTRICLE PLAX 2D LVIDd:         4.60 cm   Diastology LVIDs:         3.10 cm   LV e' medial:    4.90 cm/s LV PW:         1.10 cm   LV E/e' medial:  23.9 LV IVS:        2.10 cm   LV e' lateral:   5.22 cm/s LVOT diam:     1.80 cm   LV E/e' lateral: 22.4 LV SV:         75 LV SV Index:   45 LVOT Area:     2.54 cm  RIGHT VENTRICLE RV Basal diam:  2.70 cm RV S prime:     18.90 cm/s TAPSE (M-mode): 1.9 cm LEFT ATRIUM             Index        RIGHT ATRIUM           Index LA diam:        4.00 cm 2.43 cm/m   RA Area:     11.00 cm LA Vol (A2C):   74.5 ml 45.35 ml/m  RA Volume:   23.80 ml  14.49 ml/m LA Vol (A4C):   51.8 ml 31.53 ml/m LA Biplane Vol: 64.3 ml 39.14 ml/m  AORTIC VALVE                 PULMONIC VALVE AV Area (Vmax): 1.60 cm     PV Vmax:       1.36 m/s AV Vmax:        191.00 cm/s  PV Peak grad:  7.4 mmHg AV Peak Grad:   14.6 mmHg LVOT Vmax:      120.00 cm/s LVOT Vmean:     76.200 cm/s LVOT VTI:       0.293 m  AORTA Ao Root  diam: 3.00 cm Ao Asc diam:  2.80 cm MITRAL VALVE                TRICUSPID VALVE MV Area (PHT): 2.21 cm     TV Peak grad:   33.5 mmHg MV Decel Time: 343 msec     TV Vmax:        2.90 m/s MV E velocity: 117.00 cm/s  TR Peak grad:   33.6 mmHg MV A velocity: 158.00 cm/s  TR Vmax:        290.00 cm/s MV E/A ratio:  0.74                             SHUNTS                             Systemic VTI:  0.29 m                             Systemic Diam: 1.80 cm Jodelle Red MD Electronically signed by Jodelle Red MD Signature Date/Time: 02/27/2022/1:57:47 PM    Final    CT Angio Chest PE W/Cm &/Or Wo Cm  Result Date: 02/26/2022 CLINICAL DATA:   Pulmonary embolus suspected with high probability. Vomiting starting today. Bilateral hip pain after a fall. EXAM: CT ANGIOGRAPHY CHEST WITH CONTRAST TECHNIQUE: Multidetector CT imaging of the chest was performed using the standard protocol during bolus administration of intravenous contrast. Multiplanar CT image reconstructions and MIPs were obtained to evaluate the vascular anatomy. RADIATION DOSE REDUCTION: This exam was performed according to the departmental dose-optimization program which includes automated exposure control, adjustment of the mA and/or kV according to patient size and/or use of iterative reconstruction technique. CONTRAST:  30mL OMNIPAQUE IOHEXOL 350 MG/ML SOLN COMPARISON:  Chest radiograph 02/26/2022.  CT 03/17/2011 FINDINGS: Cardiovascular: There is good opacification of the central and segmental pulmonary arteries. No focal filling defects. No evidence of significant pulmonary embolus. Mild cardiac enlargement. Trace pericardial effusion. Normal caliber thoracic aorta. No aortic dissection. There is calcific and noncalcific plaque formation involving the aortic arch and descending aorta. Areas of plaque ulceration are suggested in the aortic arch region. Calcification of the aorta, coronary arteries, and mitral valve annulus. Mediastinum/Nodes: Thyroid gland is unremarkable. Esophagus is decompressed. Small esophageal hiatal hernia. Mediastinal lymph nodes are not pathologically enlarged, likely reactive. Lungs/Pleura: Trace bilateral pleural effusions. Mild peripheral interstitial infiltration with scattered ground-glass changes, likely mild edema. Pneumonia possible but less likely with this pattern. Atelectasis in the lung bases. Upper Abdomen: 1.5 cm diameter right adrenal gland nodule. Density measurements are 40 Hounsfield units, noncontributory on contrast-enhanced scan. Left adrenal gland nodule measuring 1.4 cm diameter. Density measurements are 115 Hounsfield units, likely  indicating calcification. Nodules are unchanged since prior study. No imaging follow-up is indicated. The gallbladder is distended with multiple layering stones or milk of calcium. No wall thickening or bile duct dilatation. Musculoskeletal: Degenerative changes in the spine. Review of the MIP images confirms the above findings. IMPRESSION: 1. No evidence of significant pulmonary embolus. 2. Airspace and interstitial infiltrates in the lungs most likely representing edema although multifocal pneumonia could potentially have this appearance in the appropriate clinical setting. 3. Cardiac enlargement with minimal pericardial effusion. 4. Calcific and noncalcific plaque formation in the thoracic aorta without aneurysm or dissection. Suggestion of ulcerated plaque in the aortic arch. 5. Bilateral adrenal  gland nodules are unchanged since prior study. Long-term stability suggests benign etiology and no imaging follow-up is indicated. 6. Distended gallbladder with multiple stones layering in the gallbladder. Changes are nonspecific but could indicate acute cholecystitis in the appropriate clinical setting. Electronically Signed   By: Burman NievesWilliam  Stevens M.D.   On: 02/26/2022 23:50   DG Chest 2 View  Result Date: 02/26/2022 CLINICAL DATA:  Cough, hypertension, vomiting.  Fell last week. EXAM: CHEST - 2 VIEW COMPARISON:  01/24/2022 FINDINGS: Slightly shallow inspiration. Heart size and pulmonary vascularity are normal for technique. Lungs are clear. No pleural effusions. No pneumothorax. Mediastinal contours appear intact. Calcification of the aorta. Degenerative changes in the spine and shoulders. IMPRESSION: Shallow inspiration.  No evidence of active pulmonary disease. Electronically Signed   By: Burman NievesWilliam  Stevens M.D.   On: 02/26/2022 20:46   DG Sacrum/Coccyx  Result Date: 02/26/2022 CLINICAL DATA:  Bilateral hip pain after a fall last week. EXAM: SACRUM AND COCCYX - 2+ VIEW COMPARISON:  None Available. FINDINGS:  Diffuse bone demineralization. There appears to be cortical irregularity suggesting nondisplaced fracture at the S3/S4 level of the sacrum. No significant displacement of fracture fragments. Sacral struts appear intact. Alignment is normal. SI joints and symphysis pubis are not displaced. IMPRESSION: Nondisplaced sacral fractures at the S3/S4 level. Electronically Signed   By: Burman NievesWilliam  Stevens M.D.   On: 02/26/2022 20:45   DG Hips Bilat W or Wo Pelvis 2 Views  Result Date: 02/26/2022 CLINICAL DATA:  Cough. Hypertension and vomiting. Bilateral hip pain after a fall last week. EXAM: DG HIP (WITH OR WITHOUT PELVIS) 2V BILAT COMPARISON:  CT 03/19/2014 FINDINGS: Degenerative changes demonstrated in the lower lumbar spine and both hips with symmetrical appearance. Pelvis and hips appear intact. No evidence of acute fracture or dislocation. No focal bone lesions. Visualized sacrum appears intact. SI joints and symphysis pubis are not displaced. Vascular calcifications. Calcified phleboliths in the pelvis. Surgical clips in the left pelvis. IMPRESSION: Degenerative changes in the lower lumbar spine and hips. No acute displaced fractures are identified. Electronically Signed   By: Burman NievesWilliam  Stevens M.D.   On: 02/26/2022 20:44   CT Head Wo Contrast  Result Date: 02/26/2022 CLINICAL DATA:  Headache, hypertension, vomiting EXAM: CT HEAD WITHOUT CONTRAST TECHNIQUE: Contiguous axial images were obtained from the base of the skull through the vertex without intravenous contrast. RADIATION DOSE REDUCTION: This exam was performed according to the departmental dose-optimization program which includes automated exposure control, adjustment of the mA and/or kV according to patient size and/or use of iterative reconstruction technique. COMPARISON:  02/17/2022 FINDINGS: Brain: Stable chronic small-vessel ischemic changes throughout the periventricular white matter. Stable encephalomalacia from prior right frontal cortical infarct.  No evidence of acute infarct or hemorrhage. Lateral ventricles and midline structures are stable. Calcification of the bilateral basal ganglia unchanged. No acute extra-axial fluid collections. No mass effect. Vascular: Stable atherosclerosis.  No hyperdense vessel. Skull: Normal. Negative for fracture or focal lesion. Sinuses/Orbits: No acute finding. Other: None. IMPRESSION: 1. Stable head CT, no acute intracranial process. Electronically Signed   By: Sharlet SalinaMichael  Brown M.D.   On: 02/26/2022 19:58    Disposition Plan & Communication  Patient status: Inpatient  Admitted From: Home Planned disposition location: Skilled nursing facility Anticipated discharge date: 2/7 pending respiratory improvement, electrolyte stabilization  Family Communication: 3 grown children at bedside    Author: Leeroy Bockhelsey L Randie Tallarico, DO [facility] 02/28/2022, 7:53 AM   Available by Epic secure chat 7AM-7PM. If 7PM-7AM, please contact night-coverage.  [facility] contact information found on [email or web address].

## 2022-02-28 NOTE — Consult Note (Signed)
Cardiology Consultation   Patient ID: Gina Kelly MRN: [id number]; DOB: 03-03-1940  Admit date: 02/26/2022 Date of Consult: 02/28/2022  PCP:  [facility] Cardiologist:  None      New consult done by Dr Fletcher Anon  Patient Profile:   Gina Kelly is a 82 y.o. female with a hx of  hypertension, CVA, diabetes who is being seen 02/28/2022 for the evaluation of diastolic congestive heart failure at the request of Dr. Ouida Sills.  History of Present Illness:   Gina Kelly is a 82 year old female with the previously mentioned past medical history of hypertension, previous stroke (2000), and diabetes.  She was recently admitted to [facility] 01/22/22 for chief complaint of dizziness and weakness. She was treated for acute hypoxic respiratory failure, hyperkalemia, acute renal failure that resolved , and debility.She was then discharged on 01/26/22.  She presented to the [facility] emergency department on 02/26/22 with ongoing headache, N/V, tailbone pain from fall earlier in the week and elevated blood pressure. She endorses shortness of breath for the last 3 days and was found to be hypoxic on arrival placed on 2L of O2 via [place] and peripheral edema to her hands and feet. She denies any chest pain, palpitations, lightheadedness or dizziness.   Initial vital signs: blood pressure 194/53, pulse 64, resp 18, temp 98.6  Pertinent labs: sodim 126, chloride 96, CO2 18, blood glucose 168, BUN 28, serum creatinine 1.42, calcium 8.5, hgb 10.2, Hs troponins 8 and 12, resp panel negative, BNP 469.9  Imaging: CT of the head showed stable head CT with no acute intracranial process; hip films reveal degenerative changes in the lower lumbar spine and hips with no acute displaced fractures; DG sacrum/coccyx revealed nondisplaced sacral fractures at the S3-S4 level; chest film revealed shallow inspiration with no evidence of active pulmonary disease; CTA of the chest revealed no evidence  of significant pulmonary embolus, airspace and interstitial infiltrates in the lungs most likely representing edema although multifocal pneumonia could potentially have this appearance in the appropriate clinical setting, cardiac enlargement with minimal pericardial effusion, calcified and noncalcified plaque formation in the thoracic aorta without aneurysm or dissection, bilateral adrenal gland nodules are unchanged since prior study, distended gallbladder with multiple stones layering in the gallbladder changes are nonspecific but could indicate acute cholecystitis  Medication administered in the emergency department: Hydralazine 100 mg tablet  Cardiology was consulted today to assist in the management of diastolic congestive heart failure.   Past Medical History:  Diagnosis Date   Diabetes mellitus without complication ([place])    Hypertension    Stroke ([place])    2000    Past Surgical History:  Procedure Laterality Date   ABDOMINAL HYSTERECTOMY       Home Medications:  Prior to Admission medications   Medication Sig Start Date End Date Taking? Authorizing Provider  acetaminophen (TYLENOL) 500 MG tablet Take 1,000 mg by mouth every 6 (six) hours as needed.   Yes [provider]  amLODipine (NORVASC) 10 MG tablet Take 10 mg by mouth daily.   Yes [provider]  atorvastatin (LIPITOR) 40 MG tablet Take 40 mg by mouth daily.   Yes [provider]  carvedilol (COREG) 25 MG tablet Take 25 mg by mouth 2 (two) times daily with a meal.   Yes [provider]  clopidogrel (PLAVIX) 75 MG tablet Take 75 mg by mouth daily.   Yes [provider]  fluticasone (FLONASE) 50 MCG/ACT nasal spray Place  2 sprays into both nostrils daily as needed for allergies or rhinitis.   Yes [provider]  glipiZIDE (GLUCOTROL XL) 5 MG 24 hr tablet Take 5 mg by mouth 2 (two) times daily with a meal.   Yes [provider]  hydrALAZINE (APRESOLINE) 100 MG  tablet Take 1 tablet (100 mg total) by mouth every 8 (eight) hours. 01/26/22 02/27/22 Yes Amin, Loura Halt, MD  levothyroxine (SYNTHROID) 75 MCG tablet Take 75 mcg by mouth daily before breakfast.   Yes [provider]  Semaglutide, 1 MG/DOSE, (OZEMPIC, 1 MG/DOSE,) 4 MG/3ML SOPN Inject 1 mg into the skin once a week. (Unless blood glucose reading had been trending low)   Yes [provider]  albuterol (PROVENTIL HFA;VENTOLIN HFA) 108 (90 BASE) MCG/ACT inhaler Inhale 2 puffs into the lungs every 6 (six) hours as needed for wheezing or shortness of breath. 07/28/14   Menshew, Charlesetta Ivory, PA-C  cloNIDine (CATAPRES) 0.1 MG tablet Take 1 tablet (0.1 mg total) by mouth 2 (two) times daily. 01/26/22   Amin, Loura Halt, MD  omeprazole (PRILOSEC) 20 MG capsule Take 20 mg by mouth daily as needed (acid reflux symptoms).    [provider]    Inpatient Medications: Scheduled Meds:  amLODipine  10 mg Oral Daily   atorvastatin  40 mg Oral Daily   carvedilol  25 mg Oral BID WC   cloNIDine  0.1 mg Oral BID   clopidogrel  75 mg Oral Daily   enoxaparin (LOVENOX) injection  40 mg Subcutaneous Q24H   hydrALAZINE  100 mg Oral Q8H   insulin aspart  0-15 Units Subcutaneous TID WC   levothyroxine  75 mcg Oral QAC breakfast   pantoprazole  40 mg Oral Daily   sodium chloride  1 g Oral BID WC   Continuous Infusions:  PRN Meds: acetaminophen **OR** acetaminophen, albuterol, HYDROcodone-acetaminophen, ondansetron **OR** ondansetron (ZOFRAN) IV  Allergies:    Allergies  Allergen Reactions   Metformin Diarrhea    CKD   Penicillins     Social History:   Social History   Socioeconomic History   Marital status: Widowed    Spouse name: Not on file   Number of children: Not on file   Years of education: Not on file   Highest education level: Not on file  Occupational History   Not on file  Tobacco Use   Smoking status: Never   Smokeless tobacco: Never  Substance and Sexual  Activity   Alcohol use: No   Drug use: No   Sexual activity: Not on file  Other Topics Concern   Not on file  Social History Narrative   Not on file   Social Determinants of Health   Financial Resource Strain: Not on file  Food Insecurity: No Food Insecurity (01/22/2022)   Hunger Vital Sign    Worried About Running Out of Food in the Last Year: Never true    Ran Out of Food in the Last Year: Never true  Transportation Needs: No Transportation Needs (01/22/2022)   PRAPARE - Administrator, Civil Service (Medical): No    Lack of Transportation (Non-Medical): No  Physical Activity: Not on file  Stress: Not on file  Social Connections: Not on file  Intimate Partner Violence: Not At Risk (01/22/2022)   Humiliation, Afraid, Rape, and Kick questionnaire    Fear of Current or Ex-Partner: No    Emotionally Abused: No    Physically Abused: No    Sexually  Abused: No    Family History:   History reviewed. No pertinent family history.   ROS:  Please see the history of present illness.  Review of Systems  Constitutional:  Positive for malaise/fatigue.  Respiratory:  Positive for shortness of breath.   Cardiovascular:  Positive for leg swelling.  Musculoskeletal:  Positive for back pain.  Neurological:  Positive for weakness.    All other ROS reviewed and negative.     Physical Exam/Data:   Vitals:   02/28/22 0259 02/28/22 0500 02/28/22 0724 02/28/22 1139  BP: (!) 167/41  (!) 182/39 (!) 159/51  Pulse: 64  65 (!) 59  Resp: 18  18 18   Temp: 98.3 F (36.8 C)  98.2 F (36.8 C) 97.6 F (36.4 C)  TempSrc: Oral     SpO2: 97%  95% 100%  Weight:  59.1 kg      Intake/Output Summary (Last 24 hours) at 02/28/2022 1441 Last data filed at 02/28/2022 1355 Gross per 24 hour  Intake 120 ml  Output 200 ml  Net -80 ml      02/28/2022    5:00 AM 01/21/2022    6:40 PM 09/12/2019    2:49 PM  Last 3 Weights  Weight (lbs) 130 lb 4.7 oz 140 lb 140 lb  Weight (kg) 59.1 kg 63.504  kg 63.504 kg     Body mass index is 23.83 kg/m.  General:  Well nourished, well developed, in no acute distress HEENT: normal, glasses on Neck: unable to assess JVD due to position and body habitus Vascular: No carotid bruits; Distal pulses 2+ bilaterally Cardiac:  normal S1, S2; RRR; no murmur  Lungs:  clear to auscultation bilaterally, no wheezing, rhonchi or rales  Abd: soft, nontender, obese, no hepatomegaly  Ext: no edema Musculoskeletal:  No deformities, BUE and BLE strength normal and equal Skin: warm and dry  Neuro:  CNs 2-12 intact, no focal abnormalities noted Psych:  Normal affect   EKG:  The EKG was personally reviewed and demonstrates:  sinus to sinus arrhythmia rate of 74 Telemetry:  Telemetry was personally reviewed and demonstrates:  sinus rates of 60's  Relevant CV Studies: TTE 02/27/22 1. Left ventricular ejection fraction, by estimation, is >75%. The left  ventricle has hyperdynamic function. The left ventricle has no regional  wall motion abnormalities. There is severe asymmetric left ventricular  hypertrophy of the basal-septal  segment. Left ventricular diastolic function could not be evaluated.   2. Right ventricular systolic function is normal. The right ventricular  size is normal. There is mildly elevated pulmonary artery systolic  pressure. The estimated right ventricular systolic pressure is 36.6 mmHg.   3. Left atrial size was moderately dilated.   4. The mitral valve is grossly normal. Trivial mitral valve  regurgitation. No evidence of mitral stenosis. Moderate mitral annular  calcification.   5. The aortic valve is tricuspid. There is mild calcification of the  aortic valve. There is mild thickening of the aortic valve. Aortic valve  regurgitation is not visualized. Aortic valve sclerosis is present, with  no evidence of aortic valve stenosis.   6. The inferior vena cava is normal in size with greater than 50%  respiratory variability, suggesting  right atrial pressure of 3 mmHg.   Laboratory Data:  High Sensitivity Troponin:   Recent Labs  Lab 02/26/22 1629 02/26/22 2300  TROPONINIHS 8 12     Chemistry Recent Labs  Lab 02/26/22 1629 02/27/22 0335 02/28/22 0844  NA 126* 126* 126*  K 3.8 3.3* 3.9  CL 96* 96* 95*  CO2 18* 20* 19*  GLUCOSE 168* 128* 98  BUN 28* 28* 45*  CREATININE 1.42* 1.37* 2.63*  CALCIUM 8.5* 8.2* 8.2*  GFRNONAA 37* 39* 18*  ANIONGAP 12 10 12     Recent Labs  Lab 02/26/22 1629 02/28/22 0844  PROT 7.0 6.4*  ALBUMIN 3.8 3.5  AST 18 17  ALT 13 12  ALKPHOS 66 62  BILITOT 1.0 0.9   Lipids No results for input(s): "CHOL", "TRIG", "HDL", "LABVLDL", "LDLCALC", "CHOLHDL" in the last 168 hours.  Hematology Recent Labs  Lab 02/26/22 1629 02/27/22 0335 02/28/22 0844  WBC 7.1 7.0 7.3  RBC 3.52* 3.18* 3.30*  HGB 10.2* 9.1* 9.5*  HCT 28.9* 26.4* 27.2*  MCV 82.1 83.0 82.4  MCH 29.0 28.6 28.8  MCHC 35.3 34.5 34.9  RDW 13.6 13.5 13.5  PLT 242 209 222   Thyroid No results for input(s): "TSH", "FREET4" in the last 168 hours.  BNP Recent Labs  Lab 02/26/22 1629  BNP 469.9*    DDimer No results for input(s): "DDIMER" in the last 168 hours.   Radiology/Studies:  ECHOCARDIOGRAM COMPLETE  Result Date: 02/27/2022    ECHOCARDIOGRAM REPORT   Patient Name:   NIEVE ROJERO Date of Exam: 02/27/2022 Medical Rec #:  [id number]    Height:       62.0 in Accession #:    [id number]   Weight:       140.0 lb Date of Birth:  07-02-1940    BSA:          1.643 m Patient Age:    68 years     BP:           175/56 mmHg Patient Gender: F            HR:           66 bpm. Exam Location:  [facility] Procedure: 2D Echo Indications:     CHF I50.31  History:         Patient has no prior history of Echocardiogram examinations.  Sonographer:     Kathlen Brunswick RDCS Referring Phys:  [id number] Athena Masse Diagnosing Phys: Buford Dresser MD IMPRESSIONS  1. Left ventricular ejection fraction, by estimation, is >75%. The left  ventricle has hyperdynamic function. The left ventricle has no regional wall motion abnormalities. There is severe asymmetric left ventricular hypertrophy of the basal-septal segment. Left ventricular diastolic function could not be evaluated.  2. Right ventricular systolic function is normal. The right ventricular size is normal. There is mildly elevated pulmonary artery systolic pressure. The estimated right ventricular systolic pressure is 43.1 mmHg.  3. Left atrial size was moderately dilated.  4. The mitral valve is grossly normal. Trivial mitral valve regurgitation. No evidence of mitral stenosis. Moderate mitral annular calcification.  5. The aortic valve is tricuspid. There is mild calcification of the aortic valve. There is mild thickening of the aortic valve. Aortic valve regurgitation is not visualized. Aortic valve sclerosis is present, with no evidence of aortic valve stenosis.  6. The inferior vena cava is normal in size with greater than 50% respiratory variability, suggesting right atrial pressure of 3 mmHg. Comparison(s): No prior Echocardiogram. Conclusion(s)/Recommendation(s): Hyperdynamic LVEF with severe basal septal hypertrophy but no echo evidence of obstruction. Normal right atrial pressure of 3 mmHg with mildly elevated PASP. FINDINGS  Left Ventricle: LVOT with turbulent blood flow but no clear systolic anterior motion of the mitral valve. Peak LV gradient 19  mmHg. Left ventricular ejection fraction, by estimation, is >75%. The left ventricle has hyperdynamic function. The left ventricle has no regional wall motion abnormalities. The left ventricular internal cavity size was normal in size. There is severe asymmetric left ventricular hypertrophy of the basal-septal segment. Left ventricular diastolic function could not be evaluated due to atrial fibrillation. Left ventricular diastolic function could not be evaluated. Right Ventricle: The right ventricular size is normal. No increase in  right ventricular wall thickness. Right ventricular systolic function is normal. There is mildly elevated pulmonary artery systolic pressure. The tricuspid regurgitant velocity is 2.90  m/s, and with an assumed right atrial pressure of 3 mmHg, the estimated right ventricular systolic pressure is 36.6 mmHg. Left Atrium: Left atrial size was moderately dilated. Right Atrium: Right atrial size was normal in size. Pericardium: There is no evidence of pericardial effusion. Mitral Valve: The mitral valve is grossly normal. Moderate mitral annular calcification. Trivial mitral valve regurgitation. No evidence of mitral valve stenosis. Tricuspid Valve: The tricuspid valve is normal in structure. Tricuspid valve regurgitation is mild . No evidence of tricuspid stenosis. Aortic Valve: The aortic valve is tricuspid. There is mild calcification of the aortic valve. There is mild thickening of the aortic valve. Aortic valve regurgitation is not visualized. Aortic valve sclerosis is present, with no evidence of aortic valve stenosis. Aortic valve peak gradient measures 14.6 mmHg. Pulmonic Valve: The pulmonic valve was grossly normal. Pulmonic valve regurgitation is trivial. No evidence of pulmonic stenosis. Aorta: The aortic root, ascending aorta, aortic arch and descending aorta are all structurally normal, with no evidence of dilitation or obstruction. Venous: The inferior vena cava is normal in size with greater than 50% respiratory variability, suggesting right atrial pressure of 3 mmHg. IAS/Shunts: The atrial septum is grossly normal.  LEFT VENTRICLE PLAX 2D LVIDd:         4.60 cm   Diastology LVIDs:         3.10 cm   LV e' medial:    4.90 cm/s LV PW:         1.10 cm   LV E/e' medial:  23.9 LV IVS:        2.10 cm   LV e' lateral:   5.22 cm/s LVOT diam:     1.80 cm   LV E/e' lateral: 22.4 LV SV:         75 LV SV Index:   45 LVOT Area:     2.54 cm  RIGHT VENTRICLE RV Basal diam:  2.70 cm RV S prime:     18.90 cm/s TAPSE  (M-mode): 1.9 cm LEFT ATRIUM             Index        RIGHT ATRIUM           Index LA diam:        4.00 cm 2.43 cm/m   RA Area:     11.00 cm LA Vol (A2C):   74.5 ml 45.35 ml/m  RA Volume:   23.80 ml  14.49 ml/m LA Vol (A4C):   51.8 ml 31.53 ml/m LA Biplane Vol: 64.3 ml 39.14 ml/m  AORTIC VALVE                 PULMONIC VALVE AV Area (Vmax): 1.60 cm     PV Vmax:       1.36 m/s AV Vmax:        191.00 cm/s  PV Peak grad:  7.4 mmHg AV Peak  Grad:   14.6 mmHg LVOT Vmax:      120.00 cm/s LVOT Vmean:     76.200 cm/s LVOT VTI:       0.293 m  AORTA Ao Root diam: 3.00 cm Ao Asc diam:  2.80 cm MITRAL VALVE                TRICUSPID VALVE MV Area (PHT): 2.21 cm     TV Peak grad:   33.5 mmHg MV Decel Time: 343 msec     TV Vmax:        2.90 m/s MV E velocity: 117.00 cm/s  TR Peak grad:   33.6 mmHg MV A velocity: 158.00 cm/s  TR Vmax:        290.00 cm/s MV E/A ratio:  0.74                             SHUNTS                             Systemic VTI:  0.29 m                             Systemic Diam: 1.80 cm Bridgette Christopher MD Electronically signed by Bridgette Christopher MD Signature Date/Time: 02/27/2022/1:57:47 PM    Final    CT Angio Chest PE W/Cm &/Or Wo Cm  Result Date: 02/26/2022 CLINICAL DATA:  Pulmonary embolus suspected with high probability. Vomiting starting today. Bilateral hip pain after a fall. EXAM: CT ANGIOGRAPHY CHEST WITH CONTRAST TECHNIQUE: Multidetector CT imaging of the chest was performed using the standard protocol during bolus administration of intravenous contrast. Multiplanar CT image reconstructions and MIPs were obtained to evaluate the vascular anatomy. RADIATION DOSE REDUCTION: This exam was performed according to the departmental dose-optimization program which includes automated exposure control, adjustment of the mA and/or kV according to patient size and/or use of iterative reconstruction technique. CONTRAST:  [id number] CentLennePresence C46micChallenge-[id number] [id number] 8T829Timpan40mgoScot829[facility]27mi829[facility] CalcasiLenne[facility] Hospital66mOfO42m82Po829[facility] [facility] LenneKoscius[facility] Commun24mtyA829[facility] ShelLenneNe50m74East829[facility] [id number] Lenne51morM829L2mkeLo829Select Specialt52m 829[facility] D[facility] Endos53mo829[facility] DigestiveLenne99[facility]'Call[facility] Federal Medical CentJuni9855 VineDoctors Hospital [facility] LaRome NGS: Cardiovascular: There is good opacification of the central and segmental pulmonary arteries. No focal filling defects. No evidence of significant pulmonary embolus. Mild cardiac enlargement. Trace pericardial effusion. Normal caliber thoracic aorta. No aortic dissection. There is calcific and noncalcific plaque formation involving the aortic arch and descending aorta. Areas of plaque ulceration are suggested in the aortic arch region. Calcification of the aorta, coronary arteries, and mitral valve annulus. Mediastinum/Nodes: Thyroid gland is unremarkable. Esophagus is decompressed. Small esophageal hiatal hernia. Mediastinal lymph nodes are not pathologically enlarged, likely reactive. Lungs/Pleura: Trace bilateral pleural effusions. Mild peripheral interstitial infiltration with scattered ground-glass changes, likely mild edema. Pneumonia possible but less likely with this pattern. Atelectasis in the lung bases. Upper Abdomen: 1.5 cm diameter right adrenal gland nodule. Density measurements are 40 Hounsfield units, noncontributory on contrast-enhanced scan. Left adrenal gland nodule measuring 1.4 cm diameter. Density measurements are 115 Hounsfield units, likely indicating calcification. Nodules are unchanged since prior study. No imaging follow-up is indicated. The gallbladder is distended with multiple layering stones or milk of calcium. No wall thickening or bile duct dilatation. Musculoskeletal: Degenerative changes in the spine. Review of the MIP images confirms the above findings. IMPRESSION: 1. No evidence of significant pulmonary embolus. 2. Airspace and interstitial infiltrates in the lungs most likely representing edema although multifocal pneumonia could potentially have this appearance in the  appropriate clinical setting. 3. Cardiac enlargement with minimal pericardial effusion. 4. Calcific and noncalcific plaque formation in the thoracic aorta without aneurysm or  dissection. Suggestion of ulcerated plaque in the aortic arch. 5. Bilateral adrenal gland nodules are unchanged since prior study. Long-term stability suggests benign etiology and no imaging follow-up is indicated. 6. Distended gallbladder with multiple stones layering in the gallbladder. Changes are nonspecific but could indicate acute cholecystitis in the appropriate clinical setting. Electronically Signed   By: Lucienne Capers M.D.   On: 02/26/2022 23:50   DG Chest 2 View  Result Date: 02/26/2022 CLINICAL DATA:  Cough, hypertension, vomiting.  Fell last week. EXAM: CHEST - 2 VIEW COMPARISON:  01/24/2022 FINDINGS: Slightly shallow inspiration. Heart size and pulmonary vascularity are normal for technique. Lungs are clear. No pleural effusions. No pneumothorax. Mediastinal contours appear intact. Calcification of the aorta. Degenerative changes in the spine and shoulders. IMPRESSION: Shallow inspiration.  No evidence of active pulmonary disease. Electronically Signed   By: Lucienne Capers M.D.   On: 02/26/2022 20:46   DG Sacrum/Coccyx  Result Date: 02/26/2022 CLINICAL DATA:  Bilateral hip pain after a fall last week. EXAM: SACRUM AND COCCYX - 2+ VIEW COMPARISON:  None Available. FINDINGS: Diffuse bone demineralization. There appears to be cortical irregularity suggesting nondisplaced fracture at the S3/S4 level of the sacrum. No significant displacement of fracture fragments. Sacral struts appear intact. Alignment is normal. SI joints and symphysis pubis are not displaced. IMPRESSION: Nondisplaced sacral fractures at the S3/S4 level. Electronically Signed   By: Lucienne Capers M.D.   On: 02/26/2022 20:45   DG Hips Bilat W or Wo Pelvis 2 Views  Result Date: 02/26/2022 CLINICAL DATA:  Cough. Hypertension and vomiting. Bilateral hip pain after a fall last week. EXAM: DG HIP (WITH OR WITHOUT PELVIS) 2V BILAT COMPARISON:  CT 03/19/2014 FINDINGS: Degenerative changes demonstrated in the lower lumbar spine and  both hips with symmetrical appearance. Pelvis and hips appear intact. No evidence of acute fracture or dislocation. No focal bone lesions. Visualized sacrum appears intact. SI joints and symphysis pubis are not displaced. Vascular calcifications. Calcified phleboliths in the pelvis. Surgical clips in the left pelvis. IMPRESSION: Degenerative changes in the lower lumbar spine and hips. No acute displaced fractures are identified. Electronically Signed   By: Lucienne Capers M.D.   On: 02/26/2022 20:44   CT Head Wo Contrast  Result Date: 02/26/2022 CLINICAL DATA:  Headache, hypertension, vomiting EXAM: CT HEAD WITHOUT CONTRAST TECHNIQUE: Contiguous axial images were obtained from the base of the skull through the vertex without intravenous contrast. RADIATION DOSE REDUCTION: This exam was performed according to the departmental dose-optimization program which includes automated exposure control, adjustment of the mA and/or kV according to patient size and/or use of iterative reconstruction technique. COMPARISON:  02/17/2022 FINDINGS: Brain: Stable chronic small-vessel ischemic changes throughout the periventricular white matter. Stable encephalomalacia from prior right frontal cortical infarct. No evidence of acute infarct or hemorrhage. Lateral ventricles and midline structures are stable. Calcification of the bilateral basal ganglia unchanged. No acute extra-axial fluid collections. No mass effect. Vascular: Stable atherosclerosis.  No hyperdense vessel. Skull: Normal. Negative for fracture or focal lesion. Sinuses/Orbits: No acute finding. Other: None. IMPRESSION: 1. Stable head CT, no acute intracranial process. Electronically Signed   By: Randa Ngo M.D.   On: 02/26/2022 19:58     Assessment and Plan:   HFpEF -Echocardiogram revealed LVEF of greater than 75% with severe asymmetric left ventricular hypertrophy of the basal septal segments -BNP  of 469.9 -Arrived with complaints of shortness of breath  with hypoxia continued on 2 L -CT of the chest negative for PE, showing airspace and interstitial infiltrates likely representing edema plus or minus multifocal pneumonia -Respiratory panel negative -IV furosemide on hold due to elevated serum creatinine -Continued on home on carvedilol -Heart failure education -Daily weights, I's and O's, low-sodium diet  Hypertensive urgency -Blood pressure 145/47 -Continued on carvedilol 25 mg twice daily, amlodipine 10 mg daily, clonidine 0.1 mg twice daily, hydralazine 100 mg every 8 hours -Vital signs per unit protocol  Acute kidney injury on CKD stage IIIb -Serum creatinine 2.63 -Serum creatinine on admission 1.37 -IV furosemide discontinued -Monitor urine output -Daily BMP -Monitor/trend/replete electrolytes as needed -Avoid nephrotoxic agents were able  History of CVA -Continued on clopidogrel and atorvastatin  Type II diabetes -Sliding scale coverage -Management per IM  6.  Hyponatremia -Serum sodium level of 126 -One month prior serum sodium of 136 -Limit free water intake -Serum creatinine increased to 2.63 after IV lasix, consider gentle hydration to correct serum sodium and elevated serum creatinine -daily bmp   Risk Assessment/Risk Scores:        New York Heart Association (NYHA) Functional Class NYHA Class II        For questions or updates, please contact [facility] Please consult [email or web address] for contact info under    Signed, Chadwin Fury, NP  02/28/2022 2:41 PM

## 2022-02-28 NOTE — Discharge Instructions (Signed)
Please follow up with hospice care in home.  Medications prescribed now are for comfort. Your medications to control things like blood pressure and blood sugars have been stopped.

## 2022-02-28 NOTE — Progress Notes (Signed)
PHARMACIST - PHYSICIAN COMMUNICATION  CONCERNING:  Enoxaparin (Lovenox) for DVT Prophylaxis   RECOMMENDATION: Patient was prescribed enoxaparin 40mg  q24 hours for VTE prophylaxis.   Filed Weights   02/28/22 0500  Weight: 59.1 kg (130 lb 4.7 oz)    Body mass index is 23.83 kg/m.  Estimated Creatinine Clearance: 13.5 mL/min (A) (by C-G formula based on SCr of 2.63 mg/dL (H)).  Patient is candidate for enoxaparin 30mg  every 24 hours based on CrCl <33ml/min or Weight <45kg  DESCRIPTION: Pharmacy has adjusted enoxaparin dose per [facility] policy.  Patient is now receiving enoxaparin 30 mg every 24 hours   Benita Gutter 02/28/2022 4:22 PM

## 2022-02-28 NOTE — TOC Progression Note (Signed)
Transition of Care [facility]) - Progression Note    Patient Details  Name: Yuriko Portales MRN: [id number] Date of Birth: 10/25/1940  [facility]) CM/SW Contact  Laurena Slimmer, RN Phone Number: 02/28/2022, 12:15 PM  Clinical Narrative:    Case reviewed for needs and disposition.   Expected Discharge Plan: [place] Barriers to Discharge: Continued Medical Work up  Expected Discharge Plan and Services                                     [place]: [place] Date [place]: 02/27/22 Time HH Agency Contacted: 0900 Representative spoke with at [place]: [place]   Social Determinants of Health ([place]) Interventions [place]: No Food Insecurity (01/22/2022)  Housing: Low Risk  (01/22/2022)  Transportation Needs: No Transportation Needs (01/22/2022)  Utilities: Not At Risk (01/22/2022)  Tobacco Use: Low Risk  (02/26/2022)    Readmission Risk Interventions    01/23/2022   12:42 PM  Readmission Risk Prevention Plan  Transportation Screening Complete  PCP or Specialist Appt within 5-7 Days Complete  Home Care Screening Complete  Medication Review (RN CM) Complete

## 2022-02-28 NOTE — Consult Note (Signed)
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  There asymmetrical left ventricular hypertrophy.  Normal right ventricular systolic function.  She presented from home where she lives with her daughter and other family members with complaints of elevated blood pressure readings at home, headache, nausea and vomiting.  She also had associated shortness of breath and lower extremity swelling.  BNP 469.  Comorbidities:  Diabetes Hypertension Stroke  Medications:  Amlodipine 10 mg daily Atorvastatin 40 mg daily Carvedilol 25 mg 2 times a day with meals Clonidine 0.1 mg 2 times a day Plavix 75 mg daily Hydralazine 100 mg every 8 hours Levothyroxine 75 mcg daily Lasix 40 mg IV every 12 hours has been discontinued  Labs:  Sodium 126, potassium 3.9, chloride 95, CO2 19, BUN 45, creatinine 2.63, albumin 3.5, estimated GFR 18, 39 from the day before. Weight is not documented Intake not documented Output 200 mL    Meeting with patient who was sitting up in the chair at bedside and her 2 daughters.  1 daughter that she lives with.  Discussed heart failure and what it means.  Discussed low-sodium diet, daughter states in the 20 years that her mom's lives with her she does not add salt at the table.  They try to pick foods that are lower in sodium.  If she has to use a vegetable that comes to the canned she does rinse it and water after she has drained it and cooks and plain will water you was seen all of oil.  Also discussed the importance of sticking with 64 ounces or less fluid restriction daily.   They do not have a scale in she has not been weighing herself.  Daughter states that she can stop at [facility] and get a scale, discussed the importance of daily weights and recording weights.  Also discussed reporting changes in her her symptoms.  Daughter states that they do check her blood pressure daily and prior to admission she had gone 3 days without her blood pressure medicines and  they are noted that her blood pressure was elevated in the 180s.  The cuff that she has is of wrist cuff he states that she has been told in the past that those are not real accurate.  I will obtain one from TOC.  They were given the living with heart failure teaching booklet, zone magnet, info on low-sodium and heart failure.  He has follow-up in the outpatient heart failure clinic on 9 February at 10 AM.  He had no further questions and we will continue to follow along.  Pricilla Riffle RN CHFN

## 2022-02-28 NOTE — Plan of Care (Signed)
Patient urine output for past 8 hrs 50cc.  Bladder scan showed 215cc in bladder.  Dr. Informed and stated "I will place nephro consult."  Patient/family informed.

## 2022-03-01 ENCOUNTER — Inpatient Hospital Stay: Payer: 59

## 2022-03-01 DIAGNOSIS — I160 Hypertensive urgency: Secondary | ICD-10-CM | POA: Diagnosis not present

## 2022-03-01 DIAGNOSIS — J9601 Acute respiratory failure with hypoxia: Secondary | ICD-10-CM | POA: Diagnosis not present

## 2022-03-01 DIAGNOSIS — J810 Acute pulmonary edema: Secondary | ICD-10-CM | POA: Diagnosis not present

## 2022-03-01 DIAGNOSIS — K8020 Calculus of gallbladder without cholecystitis without obstruction: Secondary | ICD-10-CM

## 2022-03-01 DIAGNOSIS — R0602 Shortness of breath: Secondary | ICD-10-CM | POA: Diagnosis not present

## 2022-03-01 DIAGNOSIS — I5031 Acute diastolic (congestive) heart failure: Secondary | ICD-10-CM

## 2022-03-01 LAB — BASIC METABOLIC PANEL
Anion gap: 11 (ref 5–15)
Anion gap: 10 (ref 5–15)
BUN: 51 mg/dL — ABNORMAL HIGH (ref 8–23)
BUN: 55 mg/dL — ABNORMAL HIGH (ref 8–23)
CO2: 18 mmol/L — ABNORMAL LOW (ref 22–32)
CO2: 17 mmol/L — ABNORMAL LOW (ref 22–32)
Calcium: 7.5 mg/dL — ABNORMAL LOW (ref 8.9–10.3)
Calcium: 7.4 mg/dL — ABNORMAL LOW (ref 8.9–10.3)
Chloride: 94 mmol/L — ABNORMAL LOW (ref 98–111)
Chloride: 93 mmol/L — ABNORMAL LOW (ref 98–111)
Creatinine, Ser: 3.57 mg/dL — ABNORMAL HIGH (ref 0.44–1.00)
Creatinine, Ser: 3.95 mg/dL — ABNORMAL HIGH (ref 0.44–1.00)
GFR, Estimated: 12 mL/min — ABNORMAL LOW (ref >60)
GFR, Estimated: 11 mL/min — ABNORMAL LOW (ref >60)
Glucose, Bld: 117 mg/dL — ABNORMAL HIGH (ref 70–99)
Glucose, Bld: 143 mg/dL — ABNORMAL HIGH (ref 70–99)
Potassium: 4.5 mmol/L (ref 3.5–5.1)
Potassium: 4.7 mmol/L (ref 3.5–5.1)
Sodium: 123 mmol/L — ABNORMAL LOW (ref 135–145)
Sodium: 120 mmol/L — ABNORMAL LOW (ref 135–145)

## 2022-03-01 LAB — GLUCOSE, CAPILLARY
Glucose-Capillary: 162 mg/dL — ABNORMAL HIGH (ref 70–99)
Glucose-Capillary: 177 mg/dL — ABNORMAL HIGH (ref 70–99)
Glucose-Capillary: 115 mg/dL — ABNORMAL HIGH (ref 70–99)
Glucose-Capillary: 196 mg/dL — ABNORMAL HIGH (ref 70–99)

## 2022-03-01 MED ORDER — SODIUM CHLORIDE 0.9 % IV SOLN: INTRAVENOUS | Status: DC

## 2022-03-01 MED ORDER — HEPARIN SODIUM (PORCINE) 5000 UNIT/ML IJ SOLN
5000.0000 [IU] | Freq: Three times a day (TID) | INTRAMUSCULAR | Status: DC
  Administered 2022-03-02: 5000 [IU] via SUBCUTANEOUS
  Filled 2022-03-01: qty 1

## 2022-03-01 NOTE — Progress Notes (Addendum)
Heart Failure Nurse Navigator Note  Met with patient and her daughters today, along with son and patient's sister were also at the bedside.  By teach back method and over how patient is going to take care of herself when she returns home.  Needed very little re enforcement.  They were concerned over her kidneys not working like they should and now is receiving fluid.  Explained that the staff would be watching her symptoms along with daily weights.  They voice understanding.  Asked tech to weigh patient as she has not been weighed since admission. It  appears incomplete daily  intake of 120 mL and output of 50 mL.  Patient was also given blood pressure cuff from TOC and explained what to do with getting readings.  No further questions.  Pricilla Riffle RN CHFN

## 2022-03-01 NOTE — Progress Notes (Signed)
PROGRESS NOTE  Gina Kelly    DOB: Jun 23, 1940, 82 y.o.  IRW:[id number]    Code Status: Full Code   DOA: 02/26/2022   LOS: 2   Brief hospital course  Gina Kelly is a 82 y.o. female with a PMH significant for type 2 diabetes mellitus, CKD stage IIIb/IV, essential hypertension, hypercholesterolemia, history of chronic diarrhea, Hx CVA hospitalized from 12/30 to 01/26/2022 for worsening renal function secondary to Bactrim/HCTZ and uncontrolled hypertension, complicated by fluid overload requiring Lasix.  They presented from home to the ED on 02/26/2022 with headache, nausea and vomiting  x 2 days. In addition, she complained of tailbone pain since fall last week and elevated blood pressure readings.   In the ED, it was found that they had hypertension with 202/63 and she was tachypneic at 25 with O2 sat 88% on room air requiring 3 L to maintain sats in the mid 90s. Significant findings included normal WBC and negative respiratory viral panel.  Troponin 12, BNP pending.  Sodium was 126.  Creatinine at baseline at 1.42, hemoglobin at baseline at 10.2.  Urinalysis pending.  EKG showed sinus rhythm at 74 with no acute ST-T wave changes. CTA chest significant for airspace and interstitial infiltrates in the lungs most likely representing edema, as well as cardiac enlargement with minimal pericardial effusion    Patient also had an x-ray of the sacrum and coccyx that showed a nondisplaced sacral fracture at the S3-S4 level from previous fall.  CT head was nonacute.  They were initially treated with oral hydralazine and clonidine in the ED with improvement in BP to 188/57 by admission.   Patient was admitted to medicine service for further workup and management of hypertensive emergency and HF exacerbation as outlined in detail below.  2/5- worsening kidney function resulting in oliguria likely secondary to contrast, diuretics and end-organ damage with hypertensive emergency. Diuresis held. Nephrology  consulted. Cardiology consulted with echo results for management of HF. BP improving but not WNL yet.  2/6: unfortunately, kidney function continues to worsen. Patient remains oliguric and is exhibiting signs of encephalopathy.   Assessment & Plan  Principal Problem:   Acute respiratory failure with hypoxia [facility]) Active Problems:   Hypertensive emergency   Acute CHF (congestive heart failure) (HCC)   Closed sacral fracture, resulting from fall 02/17/22 ([place])   Hyponatremia   Diabetes mellitus, type II ([place])   History of CVA (cerebrovascular accident)   Cholelithiasis   Stage 3b chronic kidney disease ([place])   Anemia in chronic kidney disease   Acute pulmonary edema (HCC)   Hypertensive urgency   Shortness of breath   Chronic kidney disease  Hypertensive emergency- much improved since admission. Systolics now around 761P. End organ damage evident in kidney and heart dysfunction - continue home meds including clonidine, amlodipine, added hydralazine  Acute CHF exacerbation  Acute respiratory failure with hypoxia- currently on 2L [place]. No oxygen at baseline.  EF this admission >75% with severe asymmetric left ventricular hypertrophy of basal-septal segment.  CTA chest showing airspace and interstitial infiltrates likely representing edema plus or minus multifocal pneumonia Respiratory viral panel negative, WBC normal so low suspicion for pneumonia - IV Lasix given on admission and held since to avoid renal injury - continue home carvedilol - Daily weights with intake and output monitoring - wean to room air as tolerated - PT/OT - cardiology, Dr. Fletcher Anon consulted, appreciate your recs  Acute renal failure on Stage 3b chronic kidney disease ([place]) Oliguria- only about 50cc urine output  yesterday worsened from 200cc recorded the day prior. Family told me today that patient has previously said she would not want dialysis but don't want to talk about it with her currently because they are  afraid it will upset her. Cr worsened with diuresis, end-organ damage Cr 1.37>2.63>3.57 - BMP am - starting gentle fluids - nephrology consulted, Dr. Candiss Norse, appreciate your recs - renal US pending - lovenox DVT ppx changed to heparin, other medication dose changes as necessary with declining renal function - palliative consult to consolidate [place] with family and patient  Gina Kelly- patient's daughter states that if mom is not going to improve, they would want to take home with hospice care and not pursue aggressive measures. Still want to pursue all medical treatments at this time. She remains full code. - palliative consult   Hyponatremia- stable around 126>123.  Likely hypervolemic related to CHF and diuresis - worsening along with renal function. Starting lite fluids - continue to monitor. - started on salt tablets to avoid fluid overload  Closed sacral fracture, resulting from fall 02/17/22 ([place]) Pain control PRN PT eval   Diabetes mellitus, type II (HCC) Sliding scale insulin coverage   Anemia in chronic kidney disease Hemoglobin at baseline at 10.2   Cholelithiasis CT showed "Distended gallbladder with multiple stones layering in the gallbladder. Changes are nonspecific but could indicate acute cholecystitis in the appropriate clinical setting" Patient denies abdominal pain so low suspicion for acute cholecystitis   History of CVA (cerebrovascular accident) Continue clopidogrel and atorvastatin  Body mass index is 24.89 kg/m.  VTE ppx: heparin injection 5,000 Units Start: 03/02/22 0600  Diet:     Diet   Diet regular Room service appropriate? Yes; Fluid consistency: Thin   Consultants: Cardiology  Nephrology Palliative   Subjective 03/01/22    Pt reports not feeling well by shaking her head when I ask how she's doing. She is unable to voice anything further to specify. She remains alert throughout my encounter and seems to be listening to me but does not respond to  conversation. Family states that she was laughing and joking yesterday evening. Last BM was yesterday. She endorses tenderness when I palpate suprapubic area  3 children and sister at bedside during rounds. Questions and concerns addressed at time of encounter. Daughter asked that we not speak about a poor prognosis in front of her mom as she doesn't want to upset her. States that patient had previously said they would not want HD.    Objective   Vitals:   03/01/22 0440 03/01/22 0937 03/01/22 1146 03/01/22 1151  BP: (!) 148/56 (!) 172/55 138/72   Pulse: (!) 56 (!) 59 (!) 55   Resp: 20 18 17    Temp: 99.5 F (37.5 C) (!) 97.5 F (36.4 C) 97.9 F (36.6 C)   TempSrc:  Oral Oral   SpO2: 96% 99% 93%   Weight:    61.7 kg    Intake/Output Summary (Last 24 hours) at 03/01/2022 1338 Last data filed at 02/28/2022 1543 Gross per 24 hour  Intake 120 ml  Output 50 ml  Net 70 ml   Filed Weights   02/28/22 0500 03/01/22 1151  Weight: 59.1 kg 61.7 kg     Physical Exam:  General: awake, alert, NAD HEENT: atraumatic, clear conjunctiva, anicteric sclera, MMM, hearing grossly normal Respiratory: normal respiratory effort. CTAB with mild crackles at bilateral bases Cardiovascular: quick capillary refill, normal S1/S2, RRR, no JVD, murmurs Gastrointestinal: soft, mild tenderness to suprapubic area. No guarding  or rebound, no masses Nervous: A&O x3. no gross focal neurologic deficits, normal speech Extremities: moves all equally, no edema, normal tone Skin: dry, intact, normal temperature, normal color. No rashes, lesions or ulcers on exposed skin Psychiatry: normal mood, congruent affect  Labs   I have personally reviewed the following labs and imaging studies CBC    Component Value Date/Time   WBC 7.3 02/28/2022 0844   RBC 3.30 (L) 02/28/2022 0844   HGB 9.5 (L) 02/28/2022 0844   HGB 13.0 07/27/2013 2235   HCT 27.2 (L) 02/28/2022 0844   HCT 37.2 07/27/2013 2235   PLT 222 02/28/2022 0844    PLT 177 07/27/2013 2235   MCV 82.4 02/28/2022 0844   MCV 83 07/27/2013 2235   MCH 28.8 02/28/2022 0844   MCHC 34.9 02/28/2022 0844   RDW 13.5 02/28/2022 0844   RDW 15.2 (H) 07/27/2013 2235   LYMPHSABS 2.8 05/18/2016 2314   LYMPHSABS 2.1 12/10/2011 0219   MONOABS 0.5 05/18/2016 2314   MONOABS 0.3 12/10/2011 0219   EOSABS 0.3 05/18/2016 2314   EOSABS 0.5 12/10/2011 0219   BASOSABS 0.1 05/18/2016 2314   BASOSABS 0.1 12/10/2011 0219   BASOSABS 1 12/10/2011 0219      Latest Ref Rng & Units 03/01/2022    4:53 AM 02/28/2022    8:44 AM 02/27/2022    3:35 AM  BMP  Glucose 70 - 99 mg/dL 117  98  128   BUN 8 - 23 mg/dL 51  45  28   Creatinine 0.44 - 1.00 mg/dL 3.57  2.63  1.37   Sodium 135 - 145 mmol/L 123  126  126   Potassium 3.5 - 5.1 mmol/L 4.5  3.9  3.3   Chloride 98 - 111 mmol/L 94  95  96   CO2 22 - 32 mmol/L 18  19  20    Calcium 8.9 - 10.3 mg/dL 7.5  8.2  8.2    CBC    Component Value Date/Time   WBC 7.3 02/28/2022 0844   RBC 3.30 (L) 02/28/2022 0844   HGB 9.5 (L) 02/28/2022 0844   HGB 13.0 07/27/2013 2235   HCT 27.2 (L) 02/28/2022 0844   HCT 37.2 07/27/2013 2235   PLT 222 02/28/2022 0844   PLT 177 07/27/2013 2235   MCV 82.4 02/28/2022 0844   MCV 83 07/27/2013 2235   MCH 28.8 02/28/2022 0844   MCHC 34.9 02/28/2022 0844   RDW 13.5 02/28/2022 0844   RDW 15.2 (H) 07/27/2013 2235   LYMPHSABS 2.8 05/18/2016 2314   LYMPHSABS 2.1 12/10/2011 0219   MONOABS 0.5 05/18/2016 2314   MONOABS 0.3 12/10/2011 0219   EOSABS 0.3 05/18/2016 2314   EOSABS 0.5 12/10/2011 0219   BASOSABS 0.1 05/18/2016 2314   BASOSABS 0.1 12/10/2011 0219   BASOSABS 1 12/10/2011 0219    Disposition Plan & Communication  Patient status: Inpatient  Admitted From: Home Planned disposition location: Skilled nursing facility vs home hospice Anticipated discharge date: TBD pending GOC about renal function and respiratory function  Family Communication: 3 grown children and sister at bedside     Author: Richarda Osmond, DO [facility] 03/01/2022, 1:38 PM   Available by Epic secure chat 7AM-7PM. If 7PM-7AM, please contact night-coverage.  [facility] contact information found on [email or web address].

## 2022-03-01 NOTE — Progress Notes (Signed)
Rounding Note    Patient Name: Gina Kelly Date of Encounter: 03/01/2022  [place] Cardiologist: None  New consult done by Dr. Fletcher Anon Subjective   Patient seen on AM rounds. Denies any chest pain or worsening shortness of breath. Serum creatine today 3.57, continued elevation. Nephrology has been consulted by IM. Only 50 mls of urine output documented in the last 24 hours.  Inpatient Medications    Scheduled Meds:  amLODipine  10 mg Oral Daily   atorvastatin  40 mg Oral Daily   carvedilol  25 mg Oral BID WC   cloNIDine  0.1 mg Oral BID   clopidogrel  75 mg Oral Daily   enoxaparin (LOVENOX) injection  30 mg Subcutaneous Q24H   hydrALAZINE  100 mg Oral Q8H   insulin aspart  0-15 Units Subcutaneous TID WC   levothyroxine  75 mcg Oral QAC breakfast   pantoprazole  40 mg Oral Daily   sodium chloride  1 g Oral BID WC   Continuous Infusions:  sodium chloride 75 mL/hr at 03/01/22 0925   PRN Meds: acetaminophen **OR** acetaminophen, albuterol, HYDROcodone-acetaminophen, ondansetron **OR** ondansetron (ZOFRAN) IV   Vital Signs    Vitals:   02/28/22 2045 02/28/22 2359 03/01/22 0440 03/01/22 0937  BP: (!) 160/41 (!) 167/44 (!) 148/56 (!) 172/55  Pulse: 62 60 (!) 56 (!) 59  Resp: 20 18 20 18   Temp: 99 F (37.2 C) 98.1 F (36.7 C) 99.5 F (37.5 C) (!) 97.5 F (36.4 C)  TempSrc:    Oral  SpO2: 97% 95% 96% 99%  Weight:        Intake/Output Summary (Last 24 hours) at 03/01/2022 1019 Last data filed at 02/28/2022 1543 Gross per 24 hour  Intake 120 ml  Output 50 ml  Net 70 ml      02/28/2022    5:00 AM 01/21/2022    6:40 PM 09/12/2019    2:49 PM  Last 3 Weights  Weight (lbs) 130 lb 4.7 oz 140 lb 140 lb  Weight (kg) 59.1 kg 63.504 kg 63.504 kg      Telemetry    Sinus brady to sinus, first degree AVB with rates 50-60 - Personally Reviewed  ECG    No new tracings - Personally Reviewed  Physical Exam   GEN: No acute distress.   Neck: no JVD  appreciated Cardiac: RRR, no murmurs, rubs, or gallops.  Respiratory: Clear to auscultation bilaterally. Respirations are unlabored at rest on 2L of O2 via [place] GI: Soft, nontender, non-distended  MS: No edema; No deformity. Neuro:  Nonfocal  Psych: Flat affect, less interactive today than yesterday  Labs    High Sensitivity Troponin:   Recent Labs  Lab 02/26/22 1629 02/26/22 2300  TROPONINIHS 8 12     Chemistry Recent Labs  Lab 02/26/22 1629 02/27/22 0335 02/28/22 0844 03/01/22 0453  NA 126* 126* 126* 123*  K 3.8 3.3* 3.9 4.5  CL 96* 96* 95* 94*  CO2 18* 20* 19* 18*  GLUCOSE 168* 128* 98 117*  BUN 28* 28* 45* 51*  CREATININE 1.42* 1.37* 2.63* 3.57*  CALCIUM 8.5* 8.2* 8.2* 7.5*  PROT 7.0  --  6.4*  --   ALBUMIN 3.8  --  3.5  --   AST 18  --  17  --   ALT 13  --  12  --   ALKPHOS 66  --  62  --   BILITOT 1.0  --  0.9  --   [facility]  37* 39* 18* 12*  ANIONGAP 12 10 12 11     Lipids No results for input(s): "CHOL", "TRIG", "HDL", "LABVLDL", "LDLCALC", "CHOLHDL" in the last 168 hours.  Hematology Recent Labs  Lab 02/26/22 1629 02/27/22 0335 02/28/22 0844  WBC 7.1 7.0 7.3  RBC 3.52* 3.18* 3.30*  HGB 10.2* 9.1* 9.5*  HCT 28.9* 26.4* 27.2*  MCV 82.1 83.0 82.4  MCH 29.0 28.6 28.8  MCHC 35.3 34.5 34.9  RDW 13.6 13.5 13.5  PLT 242 209 222   Thyroid No results for input(s): "TSH", "FREET4" in the last 168 hours.  BNP Recent Labs  Lab 02/26/22 1629  BNP 469.9*    DDimer No results for input(s): "DDIMER" in the last 168 hours.   Radiology    No results found.  Cardiac Studies   TTE 02/27/22 1. Left ventricular ejection fraction, by estimation, is >75%. The left  ventricle has hyperdynamic function. The left ventricle has no regional  wall motion abnormalities. There is severe asymmetric left ventricular  hypertrophy of the basal-septal  segment. Left ventricular diastolic function could not be evaluated.   2. Right ventricular systolic function is normal.  The right ventricular  size is normal. There is mildly elevated pulmonary artery systolic  pressure. The estimated right ventricular systolic pressure is 16.1 mmHg.   3. Left atrial size was moderately dilated.   4. The mitral valve is grossly normal. Trivial mitral valve  regurgitation. No evidence of mitral stenosis. Moderate mitral annular  calcification.   5. The aortic valve is tricuspid. There is mild calcification of the  aortic valve. There is mild thickening of the aortic valve. Aortic valve  regurgitation is not visualized. Aortic valve sclerosis is present, with  no evidence of aortic valve stenosis.   6. The inferior vena cava is normal in size with greater than 50%  respiratory variability, suggesting right atrial pressure of 3 mmHg.   Patient Profile     82 y.o. female with past medical history of uncontrolled hypertension, CVA, CKD stage III/IV, hyperlipidemia, history of chronic diarrhea, and diabetes who has been seen and evaluated for diastolic congestive heart failure.  Assessment & Plan    HFpEF -Echocardiogram revealed LVEF of greater than 75% with severe asymmetric left ventricular hypertrophy of the basal septal segments -BNP 469 point -Continued with complaints of shortness of breath since admission and hypoxia has been continued on 2 L of O2 via nasal cannula -CT of the chest was negative for PE, showing airspace and interstitial infiltrates likely representing edema plus or minus multiple focal pneumonia -Respiratory panel is negative -Diuretics on hold due to increase in serum creatinine -Continue with heart failure education -Continue on carvedilol -Daily weights, I's and O's, low-sodium diet  Hypertension -Blood pressure 172/55 -Continued on current medication regimen -Recommend renal artery ultrasound to rule out renal artery stenosis -Vital signs per unit protocol  Acute kidney injury on CKD  -Serum creatinine of 3.57 -Serum creatinine yesterday  2.63 -Diuretics remain on hold -Monitor urine output, only 50 mL documented in the last 24 hours, possible ATN in the setting of contrast exposure, IV furosemide, and a drop in blood pressure -Daily BMP -Monitor/trend/replete electrolytes as needed -Nephrology consulted by IM -Avoid nephrotoxic agents where able  History of CVA -Continued on clopidogrel and atorvastatin  Type II diabetes -Sliding scale coverage -Management per IM  Hyponatremia -Serum sodium of 123 -Limit free water intake -Daily BMP     For questions or updates, please contact [place] Please  consult [email or web address] for contact info under        Signed, Lenoir Facchini, NP  03/01/2022, 10:19 AM

## 2022-03-01 NOTE — Progress Notes (Signed)
[facility]  ROUNDING NOTE   Subjective:   [name] was admitted to [facility] on 02/26/2022 for Shortness of breath [R06.02] Acute pulmonary edema ([place]) [J81.0] Hypertensive urgency [I16.0] Calculus of gallbladder without cholecystitis without obstruction [K80.20] Acute respiratory failure with hypoxia ([place]) [J96.01] Closed fracture of sacrum, unspecified portion of sacrum, initial encounter ([place]) [S32.10XA] Chronic kidney disease, unspecified CKD stage [N18.9]  Patient was seen on 1./15/24 by Dr. Candiss Norse for hospital follow up. Patient was thought to be back to baseline during that visit. Her hydralazine dose was decreased.   Objective:  Vital signs in last 24 hours:  Temp:  [97.5 F (36.4 C)-99.5 F (37.5 C)] 97.9 F (36.6 C) (02/06 1146) Pulse Rate:  [55-62] 55 (02/06 1146) Resp:  [16-20] 17 (02/06 1146) BP: (138-172)/(41-72) 138/72 (02/06 1146) SpO2:  [93 %-99 %] 93 % (02/06 1146) Weight:  [61.7 kg] 61.7 kg (02/06 1151)  Weight change:  Filed Weights   02/28/22 0500 03/01/22 1151  Weight: 59.1 kg 61.7 kg    Intake/Output: I/O last 3 completed shifts: In: 120 [P.O.:120] Out: 250 [Urine:250]   Intake/Output this shift:  No intake/output data recorded.  Physical Exam: General: Laying in bed, somnolent   Head: Dry oral mucosal membranes  Eyes: Anicteric, PERRL  Neck: Supple, trachea midline  Lungs:  Clear to auscultation  Heart: Regular rate and rhythm  Abdomen:  Soft, nontender, obese  Extremities:  no peripheral edema.  Neurologic: Nonfocal, moving all four extremities  Skin: +tenting  Access: none    Basic Metabolic Panel: Recent Labs  Lab 02/26/22 1629 02/27/22 0335 02/28/22 0844 03/01/22 0453  NA 126* 126* 126* 123*  K 3.8 3.3* 3.9 4.5  CL 96* 96* 95* 94*  CO2 18* 20* 19* 18*  GLUCOSE 168* 128* 98 117*  BUN 28* 28* 45* 51*  CREATININE 1.42* 1.37* 2.63* 3.57*  CALCIUM 8.5* 8.2* 8.2* 7.5*    Liver Function Tests: Recent Labs   Lab 02/26/22 1629 02/28/22 0844  AST 18 17  ALT 13 12  ALKPHOS 66 62  BILITOT 1.0 0.9  PROT 7.0 6.4*  ALBUMIN 3.8 3.5   Recent Labs  Lab 02/26/22 1629  LIPASE 43   No results for input(s): "AMMONIA" in the last 168 hours.  CBC: Recent Labs  Lab 02/26/22 1629 02/27/22 0335 02/28/22 0844  WBC 7.1 7.0 7.3  HGB 10.2* 9.1* 9.5*  HCT 28.9* 26.4* 27.2*  MCV 82.1 83.0 82.4  PLT 242 209 222    Cardiac Enzymes: No results for input(s): "CKTOTAL", "CKMB", "CKMBINDEX", "TROPONINI" in the last 168 hours.  BNP: Invalid input(s): "POCBNP"  CBG: Recent Labs  Lab 02/28/22 0725 02/28/22 1140 02/28/22 1541 03/01/22 0843 03/01/22 1153  GLUCAP 71 252* 205* 162* 177*    Microbiology: Results for orders placed or performed during the hospital encounter of 02/26/22  Resp panel by RT-PCR (RSV, Flu A&B, Covid) Anterior Nasal Swab     Status: None   Collection Time: 02/26/22  9:13 PM   Specimen: Anterior Nasal Swab  Result Value Ref Range Status   SARS Coronavirus 2 by RT PCR NEGATIVE NEGATIVE Final    Comment: (NOTE) SARS-CoV-2 target nucleic acids are NOT DETECTED.  The SARS-CoV-2 RNA is generally detectable in upper respiratory specimens during the acute phase of infection. The lowest concentration of SARS-CoV-2 viral copies this assay can detect is 138 copies/mL. A negative result does not preclude SARS-Cov-2 infection and should not be used as the sole basis for treatment or  other patient management decisions. A negative result may occur with  improper specimen collection/handling, submission of specimen other than nasopharyngeal swab, presence of viral mutation(s) within the areas targeted by this assay, and inadequate number of viral copies(<138 copies/mL). A negative result must be combined with clinical observations, patient history, and epidemiological information. The expected result is Negative.  Fact Sheet for Patients:   EntrepreneurPulse.com.au  Fact Sheet for Healthcare Providers:  IncredibleEmployment.be  This test is no t yet approved or cleared by the [place] FDA and  has been authorized for detection and/or diagnosis of SARS-CoV-2 by FDA under an Emergency Use Authorization (EUA). This EUA will remain  in effect (meaning this test can be used) for the duration of the COVID-19 declaration under Section 564(b)(1) of the Act, 21 U.S.C.section 360bbb-3(b)(1), unless the authorization is terminated  or revoked sooner.       Influenza A by PCR NEGATIVE NEGATIVE Final   Influenza B by PCR NEGATIVE NEGATIVE Final    Comment: (NOTE) The Xpert Xpress SARS-CoV-2/FLU/RSV plus assay is intended as an aid in the diagnosis of influenza from Nasopharyngeal swab specimens and should not be used as a sole basis for treatment. Nasal washings and aspirates are unacceptable for Xpert Xpress SARS-CoV-2/FLU/RSV testing.  Fact Sheet for Patients: EntrepreneurPulse.com.au  Fact Sheet for Healthcare Providers: IncredibleEmployment.be  This test is not yet approved or cleared by the [place] FDA and has been authorized for detection and/or diagnosis of SARS-CoV-2 by FDA under an Emergency Use Authorization (EUA). This EUA will remain in effect (meaning this test can be used) for the duration of the COVID-19 declaration under Section 564(b)(1) of the Act, 21 U.S.C. section 360bbb-3(b)(1), unless the authorization is terminated or revoked.     Resp Syncytial Virus by PCR NEGATIVE NEGATIVE Final    Comment: (NOTE) Fact Sheet for Patients: EntrepreneurPulse.com.au  Fact Sheet for Healthcare Providers: IncredibleEmployment.be  This test is not yet approved or cleared by the [place] FDA and has been authorized for detection and/or diagnosis of SARS-CoV-2 by FDA under an Emergency Use  Authorization (EUA). This EUA will remain in effect (meaning this test can be used) for the duration of the COVID-19 declaration under Section 564(b)(1) of the Act, 21 U.S.C. section 360bbb-3(b)(1), unless the authorization is terminated or revoked.  Performed at [facility], [address] [place], [place] [address]     Coagulation Studies: No results for input(s): "LABPROT", "INR" in the last 72 hours.  Urinalysis: Recent Labs    02/26/22 2113  COLORURINE STRAW*  LABSPEC 1.020  PHURINE 5.0  GLUCOSEU NEGATIVE  HGBUR NEGATIVE  BILIRUBINUR NEGATIVE  KETONESUR NEGATIVE  PROTEINUR 30*  NITRITE NEGATIVE  LEUKOCYTESUR NEGATIVE      Imaging: No results found.   Medications:    sodium chloride 75 mL/hr at 03/01/22 0925    amLODipine  10 mg Oral Daily   atorvastatin  40 mg Oral Daily   carvedilol  25 mg Oral BID WC   cloNIDine  0.1 mg Oral BID   clopidogrel  75 mg Oral Daily   [START ON 03/02/2022] heparin  5,000 Units Subcutaneous Q8H   hydrALAZINE  100 mg Oral Q8H   insulin aspart  0-15 Units Subcutaneous TID WC   levothyroxine  75 mcg Oral QAC breakfast   pantoprazole  40 mg Oral Daily   sodium chloride  1 g Oral BID WC   acetaminophen **OR** acetaminophen, albuterol, HYDROcodone-acetaminophen, ondansetron **OR** ondansetron (ZOFRAN) IV  Assessment/ Plan:  Ms.  Gina Kelly is a 82 y.o.  female with hypertension, diabetes mellitus type II, stroke, COPD who is admitted to [facility] on 02/26/2022 for Shortness of breath [R06.02] Acute pulmonary edema ([place]) [J81.0] Hypertensive urgency [I16.0] Calculus of gallbladder without cholecystitis without obstruction [K80.20] Acute respiratory failure with hypoxia ([place]) [J96.01] Closed fracture of sacrum, unspecified portion of sacrum, initial encounter ([place]) [S32.10XA] Chronic kidney disease, unspecified CKD stage [N18.9]  Acute kidney injury on chronic kidney disease stage IIIB: baseline creatinine of 1.42, GFR of 37  on admission. Acute kidney injury most likely secondary to contrast induced nephropathy. 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  Hyponatremia: serum sodium of 126 with hypovolemia on examination and poor PO intake. Continue IV normal saline and PO sodium chloride. Hypertension: elevated readings yesterday. Current regimen of amlodipine, carvedilol, clonidine, and hydralazine.   Discussed goals of care with family. Patient is not interested in dialysis.    LOS: 2 Coleby Yett 2/6/20241:48 PM

## 2022-03-02 ENCOUNTER — Inpatient Hospital Stay: Payer: 59

## 2022-03-02 DIAGNOSIS — N179 Acute kidney failure, unspecified: Secondary | ICD-10-CM

## 2022-03-02 DIAGNOSIS — J9601 Acute respiratory failure with hypoxia: Secondary | ICD-10-CM | POA: Diagnosis not present

## 2022-03-02 DIAGNOSIS — E871 Hypo-osmolality and hyponatremia: Secondary | ICD-10-CM

## 2022-03-02 DIAGNOSIS — I160 Hypertensive urgency: Secondary | ICD-10-CM | POA: Diagnosis not present

## 2022-03-02 DIAGNOSIS — Z515 Encounter for palliative care: Secondary | ICD-10-CM

## 2022-03-02 DIAGNOSIS — K8020 Calculus of gallbladder without cholecystitis without obstruction: Secondary | ICD-10-CM | POA: Diagnosis not present

## 2022-03-02 DIAGNOSIS — J810 Acute pulmonary edema: Secondary | ICD-10-CM | POA: Diagnosis not present

## 2022-03-02 DIAGNOSIS — N182 Chronic kidney disease, stage 2 (mild): Secondary | ICD-10-CM

## 2022-03-02 LAB — BASIC METABOLIC PANEL
Anion gap: 14 (ref 5–15)
BUN: 54 mg/dL — ABNORMAL HIGH (ref 8–23)
CO2: 14 mmol/L — ABNORMAL LOW (ref 22–32)
Calcium: 7.5 mg/dL — ABNORMAL LOW (ref 8.9–10.3)
Chloride: 91 mmol/L — ABNORMAL LOW (ref 98–111)
Creatinine, Ser: 4.27 mg/dL — ABNORMAL HIGH (ref 0.44–1.00)
GFR, Estimated: 10 mL/min — ABNORMAL LOW (ref >60)
Glucose, Bld: 151 mg/dL — ABNORMAL HIGH (ref 70–99)
Potassium: 5.6 mmol/L — ABNORMAL HIGH (ref 3.5–5.1)
Sodium: 119 mmol/L — CL (ref 135–145)

## 2022-03-02 LAB — CBC
HCT: 22.5 % — ABNORMAL LOW (ref 36.0–46.0)
Hemoglobin: 7.9 g/dL — ABNORMAL LOW (ref 12.0–15.0)
MCH: 29.2 pg (ref 26.0–34.0)
MCHC: 35.1 g/dL (ref 30.0–36.0)
MCV: 83 fL (ref 80.0–100.0)
Platelets: 132 10*3/uL — ABNORMAL LOW (ref 150–400)
RBC: 2.71 MIL/uL — ABNORMAL LOW (ref 3.87–5.11)
RDW: 13.5 % (ref 11.5–15.5)
WBC: 4.9 10*3/uL (ref 4.0–10.5)
nRBC: 0 % (ref 0.0–0.2)

## 2022-03-02 LAB — GLUCOSE, CAPILLARY
Glucose-Capillary: 142 mg/dL — ABNORMAL HIGH (ref 70–99)
Glucose-Capillary: 125 mg/dL — ABNORMAL HIGH (ref 70–99)

## 2022-03-02 MED ORDER — ONDANSETRON HCL 4 MG PO TABS: 4.0000 mg | ORAL_TABLET | Freq: Four times a day (QID) | ORAL | 0 refills | Status: DC | PRN

## 2022-03-02 MED ORDER — SODIUM CHLORIDE 1 G PO TABS: 1.0000 g | ORAL_TABLET | Freq: Three times a day (TID) | ORAL | Status: DC

## 2022-03-02 MED ORDER — SODIUM CHLORIDE 3 % IV SOLN: INTRAVENOUS | Status: DC

## 2022-03-02 MED ORDER — HYDROCODONE-ACETAMINOPHEN 5-325 MG PO TABS: 1.0000 | ORAL_TABLET | ORAL | 0 refills | Status: AC | PRN

## 2022-03-02 NOTE — Care Management Important Message (Signed)
Important Message  Patient Details  Name: Gina Kelly MRN: [id number] Date of Birth: 1940/08/30   Medicare Important Message Given:  Yes     Dannette Barbara 03/02/2022, 10:50 AM

## 2022-03-02 NOTE — Progress Notes (Incomplete)
PROGRESS NOTE  Gina Kelly    DOB: 21-Oct-1940, 82 y.o.  TKP:[id number]    Code Status: Full Code   DOA: 02/26/2022   LOS: 3   Brief hospital course  Gina Kelly is a 82 y.o. female with a PMH significant for type 2 diabetes mellitus, CKD stage IIIb/IV, essential hypertension, hypercholesterolemia, history of chronic diarrhea, Hx CVA hospitalized from 12/30 to 01/26/2022 for worsening renal function secondary to Bactrim/HCTZ and uncontrolled hypertension, complicated by fluid overload requiring Lasix.  They presented from home to the ED on 02/26/2022 with headache, nausea and vomiting  x 2 days. In addition, she complained of tailbone pain since fall last week and elevated blood pressure readings.   In the ED, it was found that they had hypertension with 202/63 and she was tachypneic at 25 with O2 sat 88% on room air requiring 3 L to maintain sats in the mid 90s. Significant findings included normal WBC and negative respiratory viral panel.  Troponin 12, BNP pending.  Sodium was 126.  Creatinine at baseline at 1.42, hemoglobin at baseline at 10.2.  Urinalysis pending.  EKG showed sinus rhythm at 74 with no acute ST-T wave changes. CTA chest significant for airspace and interstitial infiltrates in the lungs most likely representing edema, as well as cardiac enlargement with minimal pericardial effusion    Patient also had an x-ray of the sacrum and coccyx that showed a nondisplaced sacral fracture at the S3-S4 level from previous fall.  CT head was nonacute.  They were initially treated with oral hydralazine and clonidine in the ED with improvement in BP to 188/57 by admission.   Patient was admitted to medicine service for further workup and management of hypertensive emergency and HF exacerbation as outlined in detail below.  2/5- worsening kidney function resulting in oliguria likely secondary to contrast, diuretics and end-organ damage with hypertensive emergency. Diuresis held. Nephrology  consulted. Cardiology consulted with echo results for management of HF. BP improving but not WNL yet.  2/6: unfortunately, kidney function continues to worsen. Patient remains oliguric and is exhibiting signs of encephalopathy.   Assessment & Plan  Principal Problem:   Acute respiratory failure with hypoxia [facility]) Active Problems:   Hypertensive emergency   Acute CHF (congestive heart failure) (HCC)   Closed sacral fracture, resulting from fall 02/17/22 ([place])   Hyponatremia   Diabetes mellitus, type II ([place])   History of CVA (cerebrovascular accident)   Cholelithiasis   Stage 3b chronic kidney disease ([place])   Anemia in chronic kidney disease   Acute pulmonary edema (HCC)   Hypertensive urgency   Shortness of breath   Chronic kidney disease   Acute heart failure with preserved ejection fraction (HFpEF) ([place])  Hypertensive emergency- much improved since admission. Systolics now around 517G. End organ damage evident in kidney and heart dysfunction - continue home meds including clonidine, amlodipine, added hydralazine  Acute CHF exacerbation  Acute respiratory failure with hypoxia- currently on 2L [place]. No oxygen at baseline.  EF this admission >75% with severe asymmetric left ventricular hypertrophy of basal-septal segment.  CTA chest showing airspace and interstitial infiltrates likely representing edema plus or minus multifocal pneumonia Respiratory viral panel negative, WBC normal so low suspicion for pneumonia - IV Lasix given on admission and held since to avoid renal injury - continue home carvedilol - Daily weights with intake and output monitoring - wean to room air as tolerated - PT/OT - cardiology, Dr. Fletcher Anon consulted, appreciate your recs  Acute renal failure on Stage  3b chronic kidney disease ([place]) Oliguria- only about 50cc urine output yesterday worsened from 200cc recorded the day prior. Family told me today that patient has previously said she would not want dialysis  but don't want to talk about it with her currently because they are afraid it will upset her. Cr worsened with diuresis, end-organ damage Cr 1.37>2.63>3.57 - BMP am - starting gentle fluids - nephrology consulted, Dr. Candiss Norse, appreciate your recs - renal US pending - lovenox DVT ppx changed to heparin, other medication dose changes as necessary with declining renal function - palliative consult to consolidate [place] with family and patient  [place]- patient's daughter states that if mom is not going to improve, they would want to take home with hospice care and not pursue aggressive measures. Still want to pursue all medical treatments at this time. She remains full code. - palliative consult   Hyponatremia- stable around 126>123.  Likely hypervolemic related to CHF and diuresis - worsening along with renal function. Starting lite fluids - continue to monitor. - started on salt tablets to avoid fluid overload  Closed sacral fracture, resulting from fall 02/17/22 ([place]) Pain control PRN PT eval   Diabetes mellitus, type II (HCC) Sliding scale insulin coverage   Anemia in chronic kidney disease Hemoglobin at baseline at 10.2   Cholelithiasis CT showed "Distended gallbladder with multiple stones layering in the gallbladder. Changes are nonspecific but could indicate acute cholecystitis in the appropriate clinical setting" Patient denies abdominal pain so low suspicion for acute cholecystitis   History of CVA (cerebrovascular accident) Continue clopidogrel and atorvastatin  Body mass index is 26.05 kg/m.  VTE ppx: heparin injection 5,000 Units Start: 03/02/22 0600  Diet:     Diet   Diet NPO time specified   Consultants: Cardiology  Nephrology Palliative   Subjective 03/02/22    Pt reports not feeling well by shaking her head when I ask how she's doing. She is unable to voice anything further to specify. She remains alert throughout my encounter and seems to be listening to me  but does not respond to conversation. Family states that she was laughing and joking yesterday evening. Last BM was yesterday. She endorses tenderness when I palpate suprapubic area  3 children and sister at bedside during rounds. Questions and concerns addressed at time of encounter. Daughter asked that we not speak about a poor prognosis in front of her mom as she doesn't want to upset her. States that patient had previously said they would not want HD.    Objective   Vitals:   03/01/22 1540 03/01/22 1919 03/01/22 2244 03/02/22 0500  BP: (!) 136/46 (!) 148/47 (!) 140/47   Pulse: (!) 52 (!) 51 (!) 50   Resp: 20 18 18    Temp: 98.6 F (37 C) 97.8 F (36.6 C) 97.7 F (36.5 C)   TempSrc: Oral     SpO2: 94% 98% 91%   Weight:    64.6 kg    Intake/Output Summary (Last 24 hours) at 03/02/2022 0737 Last data filed at 03/01/2022 1818 Gross per 24 hour  Intake 541.44 ml  Output --  Net 541.44 ml    Filed Weights   02/28/22 0500 03/01/22 1151 03/02/22 0500  Weight: 59.1 kg 61.7 kg 64.6 kg     Physical Exam:  General: awake, alert, NAD HEENT: atraumatic, clear conjunctiva, anicteric sclera, MMM, hearing grossly normal Respiratory: normal respiratory effort. CTAB with mild crackles at bilateral bases Cardiovascular: quick capillary refill, normal S1/S2, RRR, no JVD,  murmurs Gastrointestinal: soft, mild tenderness to suprapubic area. No guarding or rebound, no masses Nervous: A&O x3. no gross focal neurologic deficits, normal speech Extremities: moves all equally, no edema, normal tone Skin: dry, intact, normal temperature, normal color. No rashes, lesions or ulcers on exposed skin Psychiatry: normal mood, congruent affect  Labs   I have personally reviewed the following labs and imaging studies CBC    Component Value Date/Time   WBC 4.9 03/02/2022 0459   RBC 2.71 (L) 03/02/2022 0459   HGB 7.9 (L) 03/02/2022 0459   HGB 13.0 07/27/2013 2235   HCT 22.5 (L) 03/02/2022 0459   HCT 37.2  07/27/2013 2235   PLT 132 (L) 03/02/2022 0459   PLT 177 07/27/2013 2235   MCV 83.0 03/02/2022 0459   MCV 83 07/27/2013 2235   MCH 29.2 03/02/2022 0459   MCHC 35.1 03/02/2022 0459   RDW 13.5 03/02/2022 0459   RDW 15.2 (H) 07/27/2013 2235   LYMPHSABS 2.8 05/18/2016 2314   LYMPHSABS 2.1 12/10/2011 0219   MONOABS 0.5 05/18/2016 2314   MONOABS 0.3 12/10/2011 0219   EOSABS 0.3 05/18/2016 2314   EOSABS 0.5 12/10/2011 0219   BASOSABS 0.1 05/18/2016 2314   BASOSABS 0.1 12/10/2011 0219   BASOSABS 1 12/10/2011 0219      Latest Ref Rng & Units 03/02/2022    4:59 AM 03/01/2022    2:08 PM 03/01/2022    4:53 AM  BMP  Glucose 70 - 99 mg/dL 151  143  117   BUN 8 - 23 mg/dL 54  55  51   Creatinine 0.44 - 1.00 mg/dL 4.27  3.95  3.57   Sodium 135 - 145 mmol/L 119  120  123   Potassium 3.5 - 5.1 mmol/L 5.6  4.7  4.5   Chloride 98 - 111 mmol/L 91  93  94   CO2 22 - 32 mmol/L 14  17  18    Calcium 8.9 - 10.3 mg/dL 7.5  7.4  7.5    CBC    Component Value Date/Time   WBC 4.9 03/02/2022 0459   RBC 2.71 (L) 03/02/2022 0459   HGB 7.9 (L) 03/02/2022 0459   HGB 13.0 07/27/2013 2235   HCT 22.5 (L) 03/02/2022 0459   HCT 37.2 07/27/2013 2235   PLT 132 (L) 03/02/2022 0459   PLT 177 07/27/2013 2235   MCV 83.0 03/02/2022 0459   MCV 83 07/27/2013 2235   MCH 29.2 03/02/2022 0459   MCHC 35.1 03/02/2022 0459   RDW 13.5 03/02/2022 0459   RDW 15.2 (H) 07/27/2013 2235   LYMPHSABS 2.8 05/18/2016 2314   LYMPHSABS 2.1 12/10/2011 0219   MONOABS 0.5 05/18/2016 2314   MONOABS 0.3 12/10/2011 0219   EOSABS 0.3 05/18/2016 2314   EOSABS 0.5 12/10/2011 0219   BASOSABS 0.1 05/18/2016 2314   BASOSABS 0.1 12/10/2011 0219   BASOSABS 1 12/10/2011 0219    Disposition Plan & Communication  Patient status: Inpatient  Admitted From: Home Planned disposition location: Skilled nursing facility vs home hospice Anticipated discharge date: TBD pending GOC about renal function and respiratory function  Family Communication:  3 grown children and sister at bedside    Author: Richarda Osmond, DO [facility] 03/02/2022, 7:37 AM   Available by Epic secure chat 7AM-7PM. If 7PM-7AM, please contact night-coverage.  TR[facility] contact information found on [email or web address].

## 2022-03-02 NOTE — TOC Progression Note (Signed)
Transition of Care [facility]) - Progression Note    Patient Details  Name: Gina Kelly MRN: [id number] Date of Birth: August 11, 1940  Transition [facility]) CM/SW Contact  Laurena Slimmer, RN Phone Number: 03/02/2022, 9:47 AM  Clinical Narrative:    Per MD family would like hospice. Hospice referral sent to Doreatha Lew from [facility].    Expected Discharge Plan: [place] Barriers to Discharge: Continued Medical Work up  Expected Discharge Plan and Services                                     [place]: [place] Date [place]: 02/27/22 Time HH Agency Contacted: 0900 Representative spoke with at [place]: [place]   Social Determinants of Health ([place]) Interventions [place]: No Food Insecurity (01/22/2022)  Housing: Low Risk  (01/22/2022)  Transportation Needs: No Transportation Needs (01/22/2022)  Utilities: Not At Risk (01/22/2022)  Tobacco Use: Low Risk  (02/26/2022)    Readmission Risk Interventions    01/23/2022   12:42 PM  Readmission Risk Prevention Plan  Transportation Screening Complete  PCP or Specialist Appt within 5-7 Days Complete  Home Care Screening Complete  Medication Review (RN CM) Complete

## 2022-03-02 NOTE — Discharge Summary (Signed)
Physician Discharge Summary  Patient: Gina Kelly [id number] DOB: 1940-11-22   Code Status: Full Code Admit date: 02/26/2022 Discharge date: 03/02/2022 Disposition: Hospice care, hospice services PCP: [place]  Recommendations for Outpatient Follow-up:  Follow up with hospice care upon discharge home  Discharge Diagnoses:  Principal Problem:   Acute respiratory failure with hypoxia [facility]) Active Problems:   Hypertensive emergency   Acute CHF (congestive heart failure) ([place])   Closed sacral fracture, resulting from fall 02/17/22 ([place])   Hyponatremia   Diabetes mellitus, type II ([place])   History of CVA (cerebrovascular accident)   Cholelithiasis   Stage 3b chronic kidney disease ([place])   Anemia in chronic kidney disease   Acute pulmonary edema ([place])   Hypertensive urgency   Shortness of breath   Chronic kidney disease   Acute heart failure with preserved ejection fraction (HFpEF) ([place])   Hospice care patient   Acute renal failure superimposed on stage 2 chronic kidney disease [facility])   Hospice care  Brief Hospital Course Summary: Gina Kelly is a 82 y.o. female with a PMH significant for type 2 diabetes mellitus, CKD stage IIIb/IV, essential hypertension, hypercholesterolemia, history of chronic diarrhea, Hx CVA hospitalized from 12/30 to 01/26/2022 for worsening renal function secondary to Bactrim/HCTZ and uncontrolled hypertension, complicated by fluid overload requiring Lasix.   They presented from home to the ED on 02/26/2022 with headache, nausea and vomiting  x 2 days. In addition, she complained of tailbone pain since fall last week and elevated blood pressure readings.    In the ED, it was found that they had hypertension with 202/63 and she was tachypneic at 25 with O2 sat 88% on room air requiring 3 L to maintain sats in the mid 90s. Significant findings included normal WBC and negative respiratory viral panel.  Troponin 12, BNP pending.  Sodium was 126.   Creatinine at baseline at 1.42, hemoglobin at baseline at 10.2.  Urinalysis pending.  EKG showed sinus rhythm at 74 with no acute ST-T wave changes. CTA chest significant for airspace and interstitial infiltrates in the lungs most likely representing edema, as well as cardiac enlargement with minimal pericardial effusion    Patient also had an x-ray of the sacrum and coccyx that showed a nondisplaced sacral fracture at the S3-S4 level from previous fall.  CT head was nonacute.   They were initially treated with oral hydralazine and clonidine in the ED with improvement in BP to 188/57 by admission.    Patient was admitted to medicine service for further workup and management of hypertensive emergency and HF exacerbation as outlined in detail below.   2/5- worsening kidney function resulting in oliguria likely secondary to contrast, diuretics and end-organ damage with hypertensive emergency. Diuresis held. Nephrology consulted. Cardiology consulted with echo results for management of HF. BP improving but not WNL yet.   2/6: unfortunately, kidney function continues to worsen. Patient remains oliguric and is exhibiting signs of encephalopathy  2/7: kidney failure and severe hyponatremia again are worsening. She is unable to tolerate hypertonic fluids and is a poor candidate for HD. She also states that she does not want HD ever. Family and patient have elected to go home with hospice care at this time.   Discharge Condition: Stable Recommended discharge diet: Regular healthy diet  Consultations: Palliative Nephrology  Cardiology   Procedures/Studies: Renal US  Allergies as of 03/02/2022       Reactions   Metformin Diarrhea   CKD   Penicillins  Medication List     STOP taking these medications    amLODipine 10 MG tablet Commonly known as: NORVASC   atorvastatin 40 MG tablet Commonly known as: LIPITOR   carvedilol 25 MG tablet Commonly known as: COREG   cloNIDine 0.1  MG tablet Commonly known as: CATAPRES   clopidogrel 75 MG tablet Commonly known as: PLAVIX   glipiZIDE 5 MG 24 hr tablet Commonly known as: GLUCOTROL XL   hydrALAZINE 100 MG tablet Commonly known as: APRESOLINE   levothyroxine 75 MCG tablet Commonly known as: SYNTHROID   Ozempic (1 MG/DOSE) 4 MG/3ML Sopn Generic drug: Semaglutide (1 MG/DOSE)       TAKE these medications    acetaminophen 500 MG tablet Commonly known as: TYLENOL Take 1,000 mg by mouth every 6 (six) hours as needed.   albuterol 108 (90 Base) MCG/ACT inhaler Commonly known as: VENTOLIN HFA Inhale 2 puffs into the lungs every 6 (six) hours as needed for wheezing or shortness of breath.   fluticasone 50 MCG/ACT nasal spray Commonly known as: FLONASE Place 2 sprays into both nostrils daily as needed for allergies or rhinitis.   HYDROcodone-acetaminophen 5-325 MG tablet Commonly known as: NORCO/VICODIN Take 1 tablet by mouth every 4 (four) hours as needed for up to 5 days for moderate pain.   omeprazole 20 MG capsule Commonly known as: PRILOSEC Take 20 mg by mouth daily as needed (acid reflux symptoms).   ondansetron 4 MG tablet Commonly known as: ZOFRAN Take 1 tablet (4 mg total) by mouth every 6 (six) hours as needed for nausea.        Subjective   Pt reports nothing. She nods and shakes her head but is not conversant. Denies being in pain. Endorses not feeling well but not able to qualify. She endorses that she does not want HD. She does want to go home with hospice.   All questions and concerns were addressed at time of discharge.  Objective  Blood pressure (!) 156/42, pulse (!) 55, temperature 98 F (36.7 C), resp. rate 16, weight 64.6 kg, SpO2 91 %.   General: Pt is alert, awake, not in acute distress Cardiovascular: RRR, S1/S2 +, no rubs, no gallops Respiratory: CTA bilaterally, no wheezing, no rhonchi Abdominal: Soft, NT, ND, bowel sounds + Extremities: 2+ edema, no cyanosis  The  results of significant diagnostics from this hospitalization (including imaging, microbiology, ancillary and laboratory) are listed below for reference.   Imaging studies: ECHOCARDIOGRAM COMPLETE  Result Date: 02/27/2022    ECHOCARDIOGRAM REPORT   Patient Name:   LUA SANDIFER Date of Exam: 02/27/2022 Medical Rec #:  [id number]    Height:       62.0 in Accession #:    [id number]   Weight:       140.0 lb Date of Birth:  12/22/1940    BSA:          1.643 m Patient Age:    82 years     BP:           175/56 mmHg Patient Gender: F            HR:           66 bpm. Exam Location:  [facility] Procedure: 2D Echo Indications:     CHF I50.31  History:         Patient has no prior history of Echocardiogram examinations.  Sonographer:     Kathlen Brunswick RDCS Referring Phys:  [id number] Athena Masse Diagnosing Phys: Shawna Orleans  Harrell Gave MD IMPRESSIONS  1. Left ventricular ejection fraction, by estimation, is >75%. The left ventricle has hyperdynamic function. The left ventricle has no regional wall motion abnormalities. There is severe asymmetric left ventricular hypertrophy of the basal-septal segment. Left ventricular diastolic function could not be evaluated.  2. Right ventricular systolic function is normal. The right ventricular size is normal. There is mildly elevated pulmonary artery systolic pressure. The estimated right ventricular systolic pressure is 123XX123 mmHg.  3. Left atrial size was moderately dilated.  4. The mitral valve is grossly normal. Trivial mitral valve regurgitation. No evidence of mitral stenosis. Moderate mitral annular calcification.  5. The aortic valve is tricuspid. There is mild calcification of the aortic valve. There is mild thickening of the aortic valve. Aortic valve regurgitation is not visualized. Aortic valve sclerosis is present, with no evidence of aortic valve stenosis.  6. The inferior vena cava is normal in size with greater than 50% respiratory variability, suggesting right atrial pressure  of 3 mmHg. Comparison(s): No prior Echocardiogram. Conclusion(s)/Recommendation(s): Hyperdynamic LVEF with severe basal septal hypertrophy but no echo evidence of obstruction. Normal right atrial pressure of 3 mmHg with mildly elevated PASP. FINDINGS  Left Ventricle: LVOT with turbulent blood flow but no clear systolic anterior motion of the mitral valve. Peak LV gradient 19 mmHg. Left ventricular ejection fraction, by estimation, is >75%. The left ventricle has hyperdynamic function. The left ventricle has no regional wall motion abnormalities. The left ventricular internal cavity size was normal in size. There is severe asymmetric left ventricular hypertrophy of the basal-septal segment. Left ventricular diastolic function could not be evaluated due to atrial fibrillation. Left ventricular diastolic function could not be evaluated. Right Ventricle: The right ventricular size is normal. No increase in right ventricular wall thickness. Right ventricular systolic function is normal. There is mildly elevated pulmonary artery systolic pressure. The tricuspid regurgitant velocity is 2.90  m/s, and with an assumed right atrial pressure of 3 mmHg, the estimated right ventricular systolic pressure is 123XX123 mmHg. Left Atrium: Left atrial size was moderately dilated. Right Atrium: Right atrial size was normal in size. Pericardium: There is no evidence of pericardial effusion. Mitral Valve: The mitral valve is grossly normal. Moderate mitral annular calcification. Trivial mitral valve regurgitation. No evidence of mitral valve stenosis. Tricuspid Valve: The tricuspid valve is normal in structure. Tricuspid valve regurgitation is mild . No evidence of tricuspid stenosis. Aortic Valve: The aortic valve is tricuspid. There is mild calcification of the aortic valve. There is mild thickening of the aortic valve. Aortic valve regurgitation is not visualized. Aortic valve sclerosis is present, with no evidence of aortic valve  stenosis. Aortic valve peak gradient measures 14.6 mmHg. Pulmonic Valve: The pulmonic valve was grossly normal. Pulmonic valve regurgitation is trivial. No evidence of pulmonic stenosis. Aorta: The aortic root, ascending aorta, aortic arch and descending aorta are all structurally normal, with no evidence of dilitation or obstruction. Venous: The inferior vena cava is normal in size with greater than 50% respiratory variability, suggesting right atrial pressure of 3 mmHg. IAS/Shunts: The atrial septum is grossly normal.  LEFT VENTRICLE PLAX 2D LVIDd:         4.60 cm   Diastology LVIDs:         3.10 cm   LV e' medial:    4.90 cm/s LV PW:         1.10 cm   LV E/e' medial:  23.9 LV IVS:        2.10 cm  LV e' lateral:   5.22 cm/s LVOT diam:     1.80 cm   LV E/e' lateral: 22.4 LV SV:         75 LV SV Index:   45 LVOT Area:     2.54 cm  RIGHT VENTRICLE RV Basal diam:  2.70 cm RV S prime:     18.90 cm/s TAPSE (M-mode): 1.9 cm LEFT ATRIUM             Index        RIGHT ATRIUM           Index LA diam:        4.00 cm 2.43 cm/m   RA Area:     11.00 cm LA Vol (A2C):   74.5 ml 45.35 ml/m  RA Volume:   23.80 ml  14.49 ml/m LA Vol (A4C):   51.8 ml 31.53 ml/m LA Biplane Vol: 64.3 ml 39.14 ml/m  AORTIC VALVE                 PULMONIC VALVE AV Area (Vmax): 1.60 cm     PV Vmax:       1.36 m/s AV Vmax:        191.00 cm/s  PV Peak grad:  7.4 mmHg AV Peak Grad:   14.6 mmHg LVOT Vmax:      120.00 cm/s LVOT Vmean:     76.200 cm/s LVOT VTI:       0.293 m  AORTA Ao Root diam: 3.00 cm Ao Asc diam:  2.80 cm MITRAL VALVE                TRICUSPID VALVE MV Area (PHT): 2.21 cm     TV Peak grad:   33.5 mmHg MV Decel Time: 343 msec     TV Vmax:        2.90 m/s MV E velocity: 117.00 cm/s  TR Peak grad:   33.6 mmHg MV A velocity: 158.00 cm/s  TR Vmax:        290.00 cm/s MV E/A ratio:  0.74                             SHUNTS                             Systemic VTI:  0.29 m                             Systemic Diam: 1.80 cm Buford Dresser MD Electronically signed by Buford Dresser MD Signature Date/Time: 02/27/2022/1:57:47 PM    Final    CT Angio Chest PE W/Cm &/Or Wo Cm  Result Date: 02/26/2022 CLINICAL DATA:  Pulmonary embolus suspected with high probability. Vomiting starting today. Bilateral hip pain after a fall. EXAM: CT ANGIOGRAPHY CHEST WITH CONTRAST TECHNIQUE: Multidetector CT imaging of the chest was performed using the standard protocol during bolus administration of intravenous contrast. Multiplanar CT image reconstructions and MIPs were obtained to evaluate the vascular anatomy. RADIATION DOSE REDUCTION: This exam was performed according to the departmental dose-optimization program which includes automated exposure control, adjustment of the mA and/or kV according to patient size and/or use of iterative reconstruction technique. CONTRAST:  63m OMNIPAQUE IOHEXOL 350 MG/ML SOLN COMPARISON:  Chest radiograph 02/26/2022.  CT 03/17/2011 FINDINGS: Cardiovascular: There is good opacification of the central and segmental  pulmonary arteries. No focal filling defects. No evidence of significant pulmonary embolus. Mild cardiac enlargement. Trace pericardial effusion. Normal caliber thoracic aorta. No aortic dissection. There is calcific and noncalcific plaque formation involving the aortic arch and descending aorta. Areas of plaque ulceration are suggested in the aortic arch region. Calcification of the aorta, coronary arteries, and mitral valve annulus. Mediastinum/Nodes: Thyroid gland is unremarkable. Esophagus is decompressed. Small esophageal hiatal hernia. Mediastinal lymph nodes are not pathologically enlarged, likely reactive. Lungs/Pleura: Trace bilateral pleural effusions. Mild peripheral interstitial infiltration with scattered ground-glass changes, likely mild edema. Pneumonia possible but less likely with this pattern. Atelectasis in the lung bases. Upper Abdomen: 1.5 cm diameter right adrenal gland nodule. Density  measurements are 40 Hounsfield units, noncontributory on contrast-enhanced scan. Left adrenal gland nodule measuring 1.4 cm diameter. Density measurements are 115 Hounsfield units, likely indicating calcification. Nodules are unchanged since prior study. No imaging follow-up is indicated. The gallbladder is distended with multiple layering stones or milk of calcium. No wall thickening or bile duct dilatation. Musculoskeletal: Degenerative changes in the spine. Review of the MIP images confirms the above findings. IMPRESSION: 1. No evidence of significant pulmonary embolus. 2. Airspace and interstitial infiltrates in the lungs most likely representing edema although multifocal pneumonia could potentially have this appearance in the appropriate clinical setting. 3. Cardiac enlargement with minimal pericardial effusion. 4. Calcific and noncalcific plaque formation in the thoracic aorta without aneurysm or dissection. Suggestion of ulcerated plaque in the aortic arch. 5. Bilateral adrenal gland nodules are unchanged since prior study. Long-term stability suggests benign etiology and no imaging follow-up is indicated. 6. Distended gallbladder with multiple stones layering in the gallbladder. Changes are nonspecific but could indicate acute cholecystitis in the appropriate clinical setting. Electronically Signed   By: Lucienne Capers M.D.   On: 02/26/2022 23:50   DG Chest 2 View  Result Date: 02/26/2022 CLINICAL DATA:  Cough, hypertension, vomiting.  Fell last week. EXAM: CHEST - 2 VIEW COMPARISON:  01/24/2022 FINDINGS: Slightly shallow inspiration. Heart size and pulmonary vascularity are normal for technique. Lungs are clear. No pleural effusions. No pneumothorax. Mediastinal contours appear intact. Calcification of the aorta. Degenerative changes in the spine and shoulders. IMPRESSION: Shallow inspiration.  No evidence of active pulmonary disease. Electronically Signed   By: Lucienne Capers M.D.   On: 02/26/2022  20:46   DG Sacrum/Coccyx  Result Date: 02/26/2022 CLINICAL DATA:  Bilateral hip pain after a fall last week. EXAM: SACRUM AND COCCYX - 2+ VIEW COMPARISON:  None Available. FINDINGS: Diffuse bone demineralization. There appears to be cortical irregularity suggesting nondisplaced fracture at the S3/S4 level of the sacrum. No significant displacement of fracture fragments. Sacral struts appear intact. Alignment is normal. SI joints and symphysis pubis are not displaced. IMPRESSION: Nondisplaced sacral fractures at the S3/S4 level. Electronically Signed   By: Lucienne Capers M.D.   On: 02/26/2022 20:45   DG Hips Bilat W or Wo Pelvis 2 Views  Result Date: 02/26/2022 CLINICAL DATA:  Cough. Hypertension and vomiting. Bilateral hip pain after a fall last week. EXAM: DG HIP (WITH OR WITHOUT PELVIS) 2V BILAT COMPARISON:  CT 03/19/2014 FINDINGS: Degenerative changes demonstrated in the lower lumbar spine and both hips with symmetrical appearance. Pelvis and hips appear intact. No evidence of acute fracture or dislocation. No focal bone lesions. Visualized sacrum appears intact. SI joints and symphysis pubis are not displaced. Vascular calcifications. Calcified phleboliths in the pelvis. Surgical clips in the left pelvis. IMPRESSION: Degenerative changes in the lower lumbar  spine and hips. No acute displaced fractures are identified. Electronically Signed   By: Lucienne Capers M.D.   On: 02/26/2022 20:44   CT Head Wo Contrast  Result Date: 02/26/2022 CLINICAL DATA:  Headache, hypertension, vomiting EXAM: CT HEAD WITHOUT CONTRAST TECHNIQUE: Contiguous axial images were obtained from the base of the skull through the vertex without intravenous contrast. RADIATION DOSE REDUCTION: This exam was performed according to the departmental dose-optimization program which includes automated exposure control, adjustment of the mA and/or kV according to patient size and/or use of iterative reconstruction technique. COMPARISON:   02/17/2022 FINDINGS: Brain: Stable chronic small-vessel ischemic changes throughout the periventricular white matter. Stable encephalomalacia from prior right frontal cortical infarct. No evidence of acute infarct or hemorrhage. Lateral ventricles and midline structures are stable. Calcification of the bilateral basal ganglia unchanged. No acute extra-axial fluid collections. No mass effect. Vascular: Stable atherosclerosis.  No hyperdense vessel. Skull: Normal. Negative for fracture or focal lesion. Sinuses/Orbits: No acute finding. Other: None. IMPRESSION: 1. Stable head CT, no acute intracranial process. Electronically Signed   By: Randa Ngo M.D.   On: 02/26/2022 19:58   CT Head Wo Contrast  Result Date: 02/17/2022 CLINICAL DATA:  Head trauma, minor (Age >= 65y); Neck trauma (Age >= 65y) EXAM: CT HEAD WITHOUT CONTRAST CT CERVICAL SPINE WITHOUT CONTRAST TECHNIQUE: Multidetector CT imaging of the head and cervical spine was performed following the standard protocol without intravenous contrast. Multiplanar CT image reconstructions of the cervical spine were also generated. RADIATION DOSE REDUCTION: This exam was performed according to the departmental dose-optimization program which includes automated exposure control, adjustment of the mA and/or kV according to patient size and/or use of iterative reconstruction technique. COMPARISON:  CT May 31, 2007. FINDINGS: CT HEAD FINDINGS Brain: No evidence of acute infarction, hemorrhage, hydrocephalus, extra-axial collection or mass lesion/mass effect. Remote right frontal infarct with encephalomalacia. Extensive calcification in bilateral globus palladi. Vascular: No hyperdense vessel identified. Calcific atherosclerosis no acute fracture. Skull: No acute fracture. Sinuses/Orbits: No acute findings CT CERVICAL SPINE FINDINGS Alignment: Straightening.  No substantial sagittal subluxation. Skull base and vertebrae: Vertebral body heights are maintained. No evidence  of acute fracture. Soft tissues and spinal canal: No prevertebral fluid or swelling. No visible canal hematoma. Disc levels:  Moderate multilevel degenerative change. Upper chest: Visualized lung apices are clear. Other: Calcific atherosclerosis. IMPRESSION: No evidence of acute abnormality intracranially or in the cervical spine. Remote right frontal infarct with encephalomalacia. Extensive calcification within bilateral globus palladi. While nonspecific, this could be age related or secondary to metabolic disorder (including parathyroid disorders). Fahr disease is a less likely consideration. Electronically Signed   By: Margaretha Sheffield M.D.   On: 02/17/2022 18:13   CT Cervical Spine Wo Contrast  Result Date: 02/17/2022 CLINICAL DATA:  Head trauma, minor (Age >= 65y); Neck trauma (Age >= 65y) EXAM: CT HEAD WITHOUT CONTRAST CT CERVICAL SPINE WITHOUT CONTRAST TECHNIQUE: Multidetector CT imaging of the head and cervical spine was performed following the standard protocol without intravenous contrast. Multiplanar CT image reconstructions of the cervical spine were also generated. RADIATION DOSE REDUCTION: This exam was performed according to the departmental dose-optimization program which includes automated exposure control, adjustment of the mA and/or kV according to patient size and/or use of iterative reconstruction technique. COMPARISON:  CT May 31, 2007. FINDINGS: CT HEAD FINDINGS Brain: No evidence of acute infarction, hemorrhage, hydrocephalus, extra-axial collection or mass lesion/mass effect. Remote right frontal infarct with encephalomalacia. Extensive calcification in bilateral globus palladi. Vascular: No hyperdense  vessel identified. Calcific atherosclerosis no acute fracture. Skull: No acute fracture. Sinuses/Orbits: No acute findings CT CERVICAL SPINE FINDINGS Alignment: Straightening.  No substantial sagittal subluxation. Skull base and vertebrae: Vertebral body heights are maintained. No evidence  of acute fracture. Soft tissues and spinal canal: No prevertebral fluid or swelling. No visible canal hematoma. Disc levels:  Moderate multilevel degenerative change. Upper chest: Visualized lung apices are clear. Other: Calcific atherosclerosis. IMPRESSION: No evidence of acute abnormality intracranially or in the cervical spine. Remote right frontal infarct with encephalomalacia. Extensive calcification within bilateral globus palladi. While nonspecific, this could be age related or secondary to metabolic disorder (including parathyroid disorders). Fahr disease is a less likely consideration. Electronically Signed   By: Margaretha Sheffield M.D.   On: 02/17/2022 18:13    Labs: Basic Metabolic Panel: Recent Labs  Lab 02/27/22 0335 02/28/22 0844 03/01/22 0453 03/01/22 1408 03/02/22 0459  NA 126* 126* 123* 120* 119*  K 3.3* 3.9 4.5 4.7 5.6*  CL 96* 95* 94* 93* 91*  CO2 20* 19* 18* 17* 14*  GLUCOSE 128* 98 117* 143* 151*  BUN 28* 45* 51* 55* 54*  CREATININE 1.37* 2.63* 3.57* 3.95* 4.27*  CALCIUM 8.2* 8.2* 7.5* 7.4* 7.5*   CBC: Recent Labs  Lab 02/26/22 1629 02/27/22 0335 02/28/22 0844 03/02/22 0459  WBC 7.1 7.0 7.3 4.9  HGB 10.2* 9.1* 9.5* 7.9*  HCT 28.9* 26.4* 27.2* 22.5*  MCV 82.1 83.0 82.4 83.0  PLT 242 209 222 132*   Microbiology: Results for orders placed or performed during the hospital encounter of 02/26/22  Resp panel by RT-PCR (RSV, Flu A&B, Covid) Anterior Nasal Swab     Status: None   Collection Time: 02/26/22  9:13 PM   Specimen: Anterior Nasal Swab  Result Value Ref Range Status   SARS Coronavirus 2 by RT PCR NEGATIVE NEGATIVE Final    Comment: (NOTE) SARS-CoV-2 target nucleic acids are NOT DETECTED.  The SARS-CoV-2 RNA is generally detectable in upper respiratory specimens during the acute phase of infection. The lowest concentration of SARS-CoV-2 viral copies this assay can detect is 138 copies/mL. A negative result does not preclude SARS-Cov-2 infection and  should not be used as the sole basis for treatment or other patient management decisions. A negative result may occur with  improper specimen collection/handling, submission of specimen other than nasopharyngeal swab, presence of viral mutation(s) within the areas targeted by this assay, and inadequate number of viral copies(<138 copies/mL). A negative result must be combined with clinical observations, patient history, and epidemiological information. The expected result is Negative.  Fact Sheet for Patients:  EntrepreneurPulse.com.au  Fact Sheet for Healthcare Providers:  IncredibleEmployment.be  This test is no t yet approved or cleared by the [place] FDA and  has been authorized for detection and/or diagnosis of SARS-CoV-2 by FDA under an Emergency Use Authorization (EUA). This EUA will remain  in effect (meaning this test can be used) for the duration of the COVID-19 declaration under Section 564(b)(1) of the Act, 21 U.S.C.section 360bbb-3(b)(1), unless the authorization is terminated  or revoked sooner.       Influenza A by PCR NEGATIVE NEGATIVE Final   Influenza B by PCR NEGATIVE NEGATIVE Final    Comment: (NOTE) The Xpert Xpress SARS-CoV-2/FLU/RSV plus assay is intended as an aid in the diagnosis of influenza from Nasopharyngeal swab specimens and should not be used as a sole basis for treatment. Nasal washings and aspirates are unacceptable for Xpert Xpress SARS-CoV-2/FLU/RSV testing.  Fact Sheet for  Patients: EntrepreneurPulse.com.au  Fact Sheet for Healthcare Providers: IncredibleEmployment.be  This test is not yet approved or cleared by the [place] FDA and has been authorized for detection and/or diagnosis of SARS-CoV-2 by FDA under an Emergency Use Authorization (EUA). This EUA will remain in effect (meaning this test can be used) for the duration of the COVID-19 declaration  under Section 564(b)(1) of the Act, 21 U.S.C. section 360bbb-3(b)(1), unless the authorization is terminated or revoked.     Resp Syncytial Virus by PCR NEGATIVE NEGATIVE Final    Comment: (NOTE) Fact Sheet for Patients: EntrepreneurPulse.com.au  Fact Sheet for Healthcare Providers: IncredibleEmployment.be  This test is not yet approved or cleared by the [place] FDA and has been authorized for detection and/or diagnosis of SARS-CoV-2 by FDA under an Emergency Use Authorization (EUA). This EUA will remain in effect (meaning this test can be used) for the duration of the COVID-19 declaration under Section 564(b)(1) of the Act, 21 U.S.C. section 360bbb-3(b)(1), unless the authorization is terminated or revoked.  Performed at [facility], [address] [place] [address]    Time coordinating discharge: Over 30 minutes  Richarda Osmond, MD  [facility] 03/02/2022, 11:08 AM

## 2022-03-02 NOTE — ED Provider Notes (Signed)
[facility] Provider Note  Patient Contact: 3:10 PM (approximate)   History   Fall   HPI  Gina Kelly is a 82 y.o. female with a history diabetes, hypertension or CVA, presents to the emergency department after a mechanical fall.  Patient was waiting for dermatologist office when she.  Patient states that she lost her footing.  Patient takes Plavix, report denies.  Patient symptoms.  No chest pain, chest tightness or abdominal pain.      Physical Exam   Triage Vital Signs: ED Triage Vitals  Enc Vitals Group     BP 02/17/22 1719 (!) 173/54     Pulse Rate 02/17/22 1719 (!) 57     Resp 02/17/22 1719 18     Temp 02/17/22 1719 98.2 F (36.8 C)     Temp Source 02/17/22 1719 Oral     SpO2 02/17/22 1719 95 %     Weight --      Height --      Head Circumference --      Peak Flow --      Pain Score 02/17/22 1720 0     Pain Loc --      Pain Edu? --      Excl. in [place]? --     Most recent vital signs: Vitals:   02/17/22 1719 02/17/22 1843  BP: (!) 173/54 (!) 176/60  Pulse: (!) 57 62  Resp: 18 17  Temp: 98.2 F (36.8 C)   SpO2: 95% 96%     General: Alert and in no acute distress. Eyes:  PERRL. EOMI. Head: No acute traumatic findings ENT:      Nose: No congestion/rhinnorhea.      Mouth/Throat: Mucous membranes are moist. Neck: No stridor. No cervical spine tenderness to palpation. Cardiovascular:  Good peripheral perfusion Respiratory: Normal respiratory effort without tachypnea or retractions. Lungs CTAB. Good air entry to the bases with no decreased or absent breath sounds. Gastrointestinal: Bowel sounds 4 quadrants. Soft and nontender to palpation. No guarding or rigidity. No palpable masses. No distention. No CVA tenderness. Musculoskeletal: Full range of motion to all extremities.  Neurologic:  No gross focal neurologic deficits are appreciated.  Skin:   No rash noted    ED Results / Procedures / Treatments   Labs (all labs ordered  are listed, but only abnormal results are displayed) Labs Reviewed - No data to display      RADIOLOGY  I personally viewed and evaluated these images as part of my medical decision making, as well as reviewing the written report by the radiologist.  ED Provider Interpretation: No acute abnormality of the head or cervical spine.   PROCEDURES:  Critical Care performed: No  Procedures   MEDICATIONS ORDERED IN ED: Medications - No data to display   IMPRESSION / MDM / [place] / ED COURSE  I reviewed the triage vital signs and the nursing notes.                              Assessment and plan Fall:  82 year old female presents to the emergency department after mechanical fall.  Patient was hypertensive at triage but vital signs otherwise reassuring.  On exam, patient alert and nontoxic-appearing.  CTs of the head and cervical spine unremarkable.  Neuroexam without acute deficits.  Tylenol was recommended at home.  Return precautions were given to return with new or worsening symptoms.  FINAL CLINICAL IMPRESSION(S) / ED DIAGNOSES   Final diagnoses:  Fall, initial encounter     Rx / DC Orders   ED Discharge Orders     None        Note:  This document was prepared using Dragon voice recognition software and may include unintentional dictation errors.   Vallarie Mare Blodgett, PA-C 03/02/22 1514    Harvest Dark, MD 03/02/22 2017

## 2022-03-02 NOTE — Progress Notes (Signed)
Gina Kelly referral for home hospice received from [employer], SW TOC.  Patient with plans to discharge home today by private vehicle pending oxygen delivery.  Patient lives at home with her daughter, Naleah Kofoed, who is her primary caregiver.  Discussed hospice Medicare benefit and hospice philosophy with [place].  She request hospice services at discharge.     DME needs:  oxygen via [place] 1-2L/min DME owned:  hospital bed, [facility], rollator, wheeled walker  Referral information sent to referral intake. Please ensure patient discharges with any needed prescriptions for optimal symptom management at home.  Will continue to follow through final disposition.   Thank you for allowing participation in this patient's care.  Dimas Aguas, RN Nurse Liaison [phone]

## 2022-03-02 NOTE — Progress Notes (Signed)
[facility]  ROUNDING NOTE   Subjective:   [name] was admitted to [facility] on 02/26/2022 for Shortness of breath [R06.02] Acute pulmonary edema ([place]) [J81.0] Hypertensive urgency [I16.0] Calculus of gallbladder without cholecystitis without obstruction [K80.20] Acute respiratory failure with hypoxia ([place]) [J96.01] Closed fracture of sacrum, unspecified portion of sacrum, initial encounter ([place]) [S32.10XA] Chronic kidney disease, unspecified CKD stage [N18.9]  Patient was seen on 1./15/24 by Dr. Candiss Kelly for hospital follow up.   Patient is seen resting in bed, daughter and sister at bedside. Alert and oriented Appetite remains poor, patient states she lacks an appetite Receiving supplemental oxygen, 2 L Little urine output recorded  Objective:  Vital signs in last 24 hours:  Temp:  [97.7 F (36.5 C)-98.6 F (37 C)] 98 F (36.7 C) (02/07 0755) Pulse Rate:  [50-55] 55 (02/07 0755) Resp:  [16-20] 16 (02/07 0755) BP: (136-156)/(42-47) 156/42 (02/07 0755) SpO2:  [91 %-98 %] 91 % (02/07 0755) Weight:  [64.6 kg] 64.6 kg (02/07 0500)  Weight change:  Filed Weights   02/28/22 0500 03/01/22 1151 03/02/22 0500  Weight: 59.1 kg 61.7 kg 64.6 kg    Intake/Output: I/O last 3 completed shifts: In: 541.4 [I.V.:541.4] Out: -    Intake/Output this shift:  No intake/output data recorded.  Physical Exam: General: Laying in bed, NAD  Head: Dry oral mucosal membranes  Eyes: Anicteric  Lungs:  Clear to auscultation, normal effort  Heart: Regular rate and rhythm  Abdomen:  Soft, nontender, obese  Extremities:  no peripheral edema.  Neurologic: Nonfocal, moving all four extremities  Skin: +tenting  Access: none    Basic Metabolic Panel: Recent Labs  Lab 02/27/22 0335 02/28/22 0844 03/01/22 0453 03/01/22 1408 03/02/22 0459  NA 126* 126* 123* 120* 119*  K 3.3* 3.9 4.5 4.7 5.6*  CL 96* 95* 94* 93* 91*  CO2 20* 19* 18* 17* 14*  GLUCOSE 128* 98 117* 143* 151*   BUN 28* 45* 51* 55* 54*  CREATININE 1.37* 2.63* 3.57* 3.95* 4.27*  CALCIUM 8.2* 8.2* 7.5* 7.4* 7.5*     Liver Function Tests: Recent Labs  Lab 02/26/22 1629 02/28/22 0844  AST 18 17  ALT 13 12  ALKPHOS 66 62  BILITOT 1.0 0.9  PROT 7.0 6.4*  ALBUMIN 3.8 3.5    Recent Labs  Lab 02/26/22 1629  LIPASE 43    No results for input(s): "AMMONIA" in the last 168 hours.  CBC: Recent Labs  Lab 02/26/22 1629 02/27/22 0335 02/28/22 0844 03/02/22 0459  WBC 7.1 7.0 7.3 4.9  HGB 10.2* 9.1* 9.5* 7.9*  HCT 28.9* 26.4* 27.2* 22.5*  MCV 82.1 83.0 82.4 83.0  PLT 242 209 222 132*     Cardiac Enzymes: No results for input(s): "CKTOTAL", "CKMB", "CKMBINDEX", "TROPONINI" in the last 168 hours.  BNP: Invalid input(s): "POCBNP"  CBG: Recent Labs  Lab 03/01/22 1153 03/01/22 1539 03/01/22 2136 03/02/22 0650 03/02/22 1024  GLUCAP 177* 115* 196* 142* 125*     Microbiology: Results for orders placed or performed during the hospital encounter of 02/26/22  Resp panel by RT-PCR (RSV, Flu A&B, Covid) Anterior Nasal Swab     Status: None   Collection Time: 02/26/22  9:13 PM   Specimen: Anterior Nasal Swab  Result Value Ref Range Status   SARS Coronavirus 2 by RT PCR NEGATIVE NEGATIVE Final    Comment: (NOTE) SARS-CoV-2 target nucleic acids are NOT DETECTED.  The SARS-CoV-2 RNA is generally detectable in upper respiratory specimens during the  acute phase of infection. The lowest concentration of SARS-CoV-2 viral copies this assay can detect is 138 copies/mL. A negative result does not preclude SARS-Cov-2 infection and should not be used as the sole basis for treatment or other patient management decisions. A negative result may occur with  improper specimen collection/handling, submission of specimen other than nasopharyngeal swab, presence of viral mutation(s) within the areas targeted by this assay, and inadequate number of viral copies(<138 copies/mL). A negative result  must be combined with clinical observations, patient history, and epidemiological information. The expected result is Negative.  Fact Sheet for Patients:  EntrepreneurPulse.com.au  Fact Sheet for Healthcare Providers:  IncredibleEmployment.be  This test is no t yet approved or cleared by the [place] FDA and  has been authorized for detection and/or diagnosis of SARS-CoV-2 by FDA under an Emergency Use Authorization (EUA). This EUA will remain  in effect (meaning this test can be used) for the duration of the COVID-19 declaration under Section 564(b)(1) of the Act, 21 U.S.C.section 360bbb-3(b)(1), unless the authorization is terminated  or revoked sooner.       Influenza A by PCR NEGATIVE NEGATIVE Final   Influenza B by PCR NEGATIVE NEGATIVE Final    Comment: (NOTE) The Xpert Xpress SARS-CoV-2/FLU/RSV plus assay is intended as an aid in the diagnosis of influenza from Nasopharyngeal swab specimens and should not be used as a sole basis for treatment. Nasal washings and aspirates are unacceptable for Xpert Xpress SARS-CoV-2/FLU/RSV testing.  Fact Sheet for Patients: EntrepreneurPulse.com.au  Fact Sheet for Healthcare Providers: IncredibleEmployment.be  This test is not yet approved or cleared by the [place] FDA and has been authorized for detection and/or diagnosis of SARS-CoV-2 by FDA under an Emergency Use Authorization (EUA). This EUA will remain in effect (meaning this test can be used) for the duration of the COVID-19 declaration under Section 564(b)(1) of the Act, 21 U.S.C. section 360bbb-3(b)(1), unless the authorization is terminated or revoked.     Resp Syncytial Virus by PCR NEGATIVE NEGATIVE Final    Comment: (NOTE) Fact Sheet for Patients: EntrepreneurPulse.com.au  Fact Sheet for Healthcare Providers: IncredibleEmployment.be  This test is  not yet approved or cleared by the [place] FDA and has been authorized for detection and/or diagnosis of SARS-CoV-2 by FDA under an Emergency Use Authorization (EUA). This EUA will remain in effect (meaning this test can be used) for the duration of the COVID-19 declaration under Section 564(b)(1) of the Act, 21 U.S.C. section 360bbb-3(b)(1), unless the authorization is terminated or revoked.  Performed at [facility], [address] [place], [place] [address]     Coagulation Studies: No results for input(s): "LABPROT", "INR" in the last 72 hours.  Urinalysis: No results for input(s): "COLORURINE", "LABSPEC", "PHURINE", "GLUCOSEU", "HGBUR", "BILIRUBINUR", "KETONESUR", "PROTEINUR", "UROBILINOGEN", "NITRITE", "LEUKOCYTESUR" in the last 72 hours.  Invalid input(s): "APPERANCEUR"     Imaging: US RENAL ARTERY DUPLEX COMPLETE  Result Date: 03/02/2022 CLINICAL DATA:  Renal failure.  Hypertension. EXAM: RENAL/URINARY TRACT ULTRASOUND RENAL DUPLEX DOPPLER ULTRASOUND COMPARISON:  Renal ultrasound 01/22/2022 FINDINGS: Right Kidney: Size: 11.3 x 5.1 x 5.5 cm, volume 165 mL. Cortical thinning and increased echogenicity in the right kidney. Findings are similar to the previous examination. Negative for hydronephrosis. 0.8 cm hypoechoic structure in the right kidney is suggestive for a small cyst. Left Kidney: Size: 11.5 x 5.4 x 5.8 cm, volume 186 mL. Diffuse cortical thinning slightly increased echogenicity. No hydronephrosis. Evidence for small left renal cysts, largest measures 1.2 cm. Bladder: Small  amount of fluid in the urinary bladder. No gross abnormality. RENAL DUPLEX ULTRASOUND Right Renal Artery Velocities: Origin:  31 cm/sec Mid:  93 cm/sec [place]:  112 cm/sec Interlobar:  25 cm/sec Arcuate:  15 cm/sec Left Renal Artery Velocities: Origin:  43 cm/sec Mid:  99 cm/sec Hilum:  84 cm/sec Interlobar:  28 cm/sec Arcuate:  18 cm/sec Aortic Velocity:  96 cm/sec Right Renal-Aortic  Ratios: Origin: 0.3 Mid:  1.0 Hilum: 1.1 Interlobar: 0.3 Arcuate: 0.2 Left Renal-Aortic Ratios: Origin: 0.4 Mid: 1.0 Hilum: 0.9 Interlobar: 0.3 Arcuate: 0.2 Bilateral renal veins are patent. IMPRESSION: 1. No evidence for renal artery stenosis. 2. Slightly increased echogenicity with cortical thinning in both kidneys. Negative for hydronephrosis. Findings are suggestive for chronic medical renal disease. Electronically Signed   By: Markus Daft M.D.   On: 03/02/2022 12:15     Medications:      carvedilol  25 mg Oral BID WC   cloNIDine  0.1 mg Oral BID   clopidogrel  75 mg Oral Daily   hydrALAZINE  100 mg Oral Q8H   insulin aspart  0-15 Units Subcutaneous TID WC   pantoprazole  40 mg Oral Daily   acetaminophen **OR** acetaminophen, albuterol, HYDROcodone-acetaminophen, ondansetron **OR** ondansetron (ZOFRAN) IV  Assessment/ Plan:  [name] is a 82 y.o.  female with hypertension, diabetes mellitus type II, stroke, COPD who is [facility] for Shortness of breath [R06.02] Acute pulmonary edema ([place]) [J81.0] Hypertensive urgency [I16.0] Calculus of gallbladder without cholecystitis without obstruction [K80.20] Acute respiratory failure with hypoxia ([place]) [J96.01] Closed fracture of sacrum, unspecified portion of sacrum, initial encounter ([place]) [S32.10XA] Chronic kidney disease, unspecified CKD stage [N18.9]  Acute kidney injury on chronic kidney disease stage IIIB: baseline creatinine of 1.42, GFR of 37 on admission. Acute kidney injury most likely secondary to contrast induced nephropathy. 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   Renal function continues to deteriorate with urine output. Electrolyte imbalances also noted. Patient states she does not want dialysis or any care that involves intubation.   Hyponatremia: serum sodium of 119 today. Will stop salt tabs for now. Considerations made to imitate hypertonic saline. Discussed with patient patient who refuses  this treatment. Patient and family encouraged to increase oral intake.   Hypertension: elevated readings yesterday. Current regimen of amlodipine, carvedilol, clonidine, and hydralazine.   After discussing treatment plan with patient and family, patient states she would like to be discharged home. Recommend palliative/hospice evaluation. Primary team at bedside.    LOS: 3 [place] 2/7/20242:12 PM

## 2022-03-02 NOTE — Progress Notes (Signed)
Rounding Note    Patient Name: Gina Kelly Date of Encounter: 03/02/2022  [place] Cardiologist: New  Subjective   Kidney function is worse today. K 5.6 and NA 119. The patient denies chest pain, she remains on supplemental O2 at 2L. Hgb is trending down. Plan for palliative to see patient.   Inpatient Medications    Scheduled Meds:  carvedilol  25 mg Oral BID WC   cloNIDine  0.1 mg Oral BID   clopidogrel  75 mg Oral Daily   hydrALAZINE  100 mg Oral Q8H   insulin aspart  0-15 Units Subcutaneous TID WC   pantoprazole  40 mg Oral Daily   Continuous Infusions:  PRN Meds: acetaminophen **OR** acetaminophen, albuterol, HYDROcodone-acetaminophen, ondansetron **OR** ondansetron (ZOFRAN) IV   Vital Signs    Vitals:   03/01/22 1919 03/01/22 2244 03/02/22 0500 03/02/22 0755  BP: (!) 148/47 (!) 140/47  (!) 156/42  Pulse: (!) 51 (!) 50  (!) 55  Resp: 18 18  16   Temp: 97.8 F (36.6 C) 97.7 F (36.5 C)  98 F (36.7 C)  TempSrc:      SpO2: 98% 91%  91%  Weight:   64.6 kg     Intake/Output Summary (Last 24 hours) at 03/02/2022 1105 Last data filed at 03/01/2022 1818 Gross per 24 hour  Intake 541.44 ml  Output --  Net 541.44 ml      03/02/2022    5:00 AM 03/01/2022   11:51 AM 02/28/2022    5:00 AM  Last 3 Weights  Weight (lbs) 142 lb 6.7 oz 136 lb 1.6 oz 130 lb 4.7 oz  Weight (kg) 64.6 kg 61.735 kg 59.1 kg      Telemetry    Nsr HR 50s - Personally Reviewed  ECG    No new - Personally Reviewed  Physical Exam   GEN: No acute distress.   Neck: No JVD Cardiac: RRR, no murmurs, rubs, or gallops.  Respiratory: crackles at bases GI: Soft, nontender, non-distended  MS: No edema; No deformity. Neuro:  Nonfocal  Psych: Normal affect   Labs    High Sensitivity Troponin:   Recent Labs  Lab 02/26/22 1629 02/26/22 2300  TROPONINIHS 8 12     Chemistry Recent Labs  Lab 02/26/22 1629 02/27/22 0335 02/28/22 0844 03/01/22 0453 03/01/22 1408  03/02/22 0459  NA 126*   < > 126* 123* 120* 119*  K 3.8   < > 3.9 4.5 4.7 5.6*  CL 96*   < > 95* 94* 93* 91*  CO2 18*   < > 19* 18* 17* 14*  GLUCOSE 168*   < > 98 117* 143* 151*  BUN 28*   < > 45* 51* 55* 54*  CREATININE 1.42*   < > 2.63* 3.57* 3.95* 4.27*  CALCIUM 8.5*   < > 8.2* 7.5* 7.4* 7.5*  PROT 7.0  --  6.4*  --   --   --   ALBUMIN 3.8  --  3.5  --   --   --   AST 18  --  17  --   --   --   ALT 13  --  12  --   --   --   ALKPHOS 66  --  62  --   --   --   BILITOT 1.0  --  0.9  --   --   --   GFRNONAA 37*   < > 18* 12* 11* 10*  ANIONGAP  12   < > 12 11 10 14    < > = values in this interval not displayed.    Lipids No results for input(s): "CHOL", "TRIG", "HDL", "LABVLDL", "LDLCALC", "CHOLHDL" in the last 168 hours.  Hematology Recent Labs  Lab 02/27/22 0335 02/28/22 0844 03/02/22 0459  WBC 7.0 7.3 4.9  RBC 3.18* 3.30* 2.71*  HGB 9.1* 9.5* 7.9*  HCT 26.4* 27.2* 22.5*  MCV 83.0 82.4 83.0  MCH 28.6 28.8 29.2  MCHC 34.5 34.9 35.1  RDW 13.5 13.5 13.5  PLT 209 222 132*   Thyroid No results for input(s): "TSH", "FREET4" in the last 168 hours.  BNP Recent Labs  Lab 02/26/22 1629  BNP 469.9*    DDimer No results for input(s): "DDIMER" in the last 168 hours.   Radiology    No results found.  Cardiac Studies   Echo 02/2022 1. Left ventricular ejection fraction, by estimation, is >75%. The left  ventricle has hyperdynamic function. The left ventricle has no regional  wall motion abnormalities. There is severe asymmetric left ventricular  hypertrophy of the basal-septal  segment. Left ventricular diastolic function could not be evaluated.   2. Right ventricular systolic function is normal. The right ventricular  size is normal. There is mildly elevated pulmonary artery systolic  pressure. The estimated right ventricular systolic pressure is 85.4 mmHg.   3. Left atrial size was moderately dilated.   4. The mitral valve is grossly normal. Trivial mitral valve   regurgitation. No evidence of mitral stenosis. Moderate mitral annular  calcification.   5. The aortic valve is tricuspid. There is mild calcification of the  aortic valve. There is mild thickening of the aortic valve. Aortic valve  regurgitation is not visualized. Aortic valve sclerosis is present, with  no evidence of aortic valve stenosis.   6. The inferior vena cava is normal in size with greater than 50%  respiratory variability, suggesting right atrial pressure of 3 mmHg.   Comparison(s): No prior Echocardiogram.   Patient Profile     82 y.o. female with a pmh of uncontrolled HTN, CVA, CKD stage 111/IV, HLD, h/o chronic diarrhea, DM who is being seen for diastolic heart failure.  Assessment & Plan    HFpEF -Echo showed LVEF>75% with severe asymmetric LVH of the basal septal segments - BNP 469 - presented with SOB with hypoxia on 2L O2 - CT chest negative for PE - respiratory panel negative - Diuretics held 2/5 for worsening kidney function. Nephrology is following - continue Coreg - With hyponatremia Na 119>salt tablets held - kidney function continues to worsen. Continue to hold diuretics  HTN - BP elevated - Coreg 25mg BID - Clonidine 0.1 mg BID - Hydralazine 100mg  TID - PTA amlodipine 5mg  was held  AKI on CKD stage 3 - Scr 2.63>>> 4.27 today - Baseline Scr 1.4 - diuretics held - nephrology consulted. Suspect AKI secondary to contrast induced nephropathy - patient does not want dialysis  H/o CVA - continue ASA and Plavix  GOC - may transition to hospice care, palliative care consulted   For questions or updates, please contact [place] Please consult [email or web address] for contact info under        Signed, Keilyn Nadal Ninfa [place], PA-C  03/02/2022, 11:05 AM

## 2022-03-02 NOTE — TOC Transition Note (Signed)
Transition of Care [facility]) - CM/SW Discharge Note   Patient Details  Name: Gina Kelly MRN: [id number] Date of Birth: [birth year]  [facility] CM/SW Contact:  Laurena Slimmer, RN Phone Number: 03/02/2022, 12:22 PM   Clinical Narrative:    Spoke with patient and her family at bedside. Patient accepted hospice via [place].  She is discharging home today with oxygen via hospice.  Her family will transport her home.   TOC signing off.         Barriers to Discharge: Continued Medical Work up   Patient Goals and CMS Choice CMS Medicare.gov Compare Post Acute Care list provided to:: Patient    Discharge Placement                         Discharge Plan and Services Additional resources added to the After Visit Summary for                              [place]: [place] Date [place]: 02/27/22 Time HH Agency Contacted: 0900 Representative spoke with at [place]: [place]  Social Determinants of Health ([place]) Interventions [place]: No Food Insecurity (01/22/2022)  Housing: Low Risk  (01/22/2022)  Transportation Needs: No Transportation Needs (01/22/2022)  Utilities: Not At Risk (01/22/2022)  Tobacco Use: Low Risk  (02/26/2022)     Readmission Risk Interventions    01/23/2022   12:42 PM  Readmission Risk Prevention Plan  Transportation Screening Complete  PCP or Specialist Appt within 5-7 Days Complete  Home Care Screening Complete  Medication Review (RN CM) Complete

## 2022-03-04 ENCOUNTER — Encounter: Payer: 59 | Admitting: Family

## 2022-07-25 DEATH — deceased

## 2022-08-29 ENCOUNTER — Telehealth: Payer: Self-pay | Admitting: *Deleted

## 2022-08-29 NOTE — Telephone Encounter (Signed)
Contacted pt to verify cardiac hx no answer or vm setup.

## 2022-08-30 ENCOUNTER — Ambulatory Visit: Payer: Medicaid Other | Attending: Cardiology | Admitting: Cardiology

## 2022-08-30 ENCOUNTER — Encounter: Payer: Self-pay | Admitting: Cardiology
